# Patient Record
Sex: Female | Born: 1945
Health system: Southern US, Community
[De-identification: ages and names within clinical notes are randomized; demographics above are authoritative.]

## PROBLEM LIST (undated history)

## (undated) DIAGNOSIS — Z9889 Other specified postprocedural states: Secondary | ICD-10-CM

## (undated) DIAGNOSIS — A809 Acute poliomyelitis, unspecified: Secondary | ICD-10-CM

## (undated) DIAGNOSIS — I341 Nonrheumatic mitral (valve) prolapse: Secondary | ICD-10-CM

## (undated) DIAGNOSIS — R000 Tachycardia, unspecified: Secondary | ICD-10-CM

## (undated) DIAGNOSIS — C801 Malignant (primary) neoplasm, unspecified: Secondary | ICD-10-CM

## (undated) DIAGNOSIS — T8859XA Other complications of anesthesia, initial encounter: Secondary | ICD-10-CM

## (undated) DIAGNOSIS — I10 Essential (primary) hypertension: Secondary | ICD-10-CM

## (undated) DIAGNOSIS — T884XXA Failed or difficult intubation, initial encounter: Secondary | ICD-10-CM

## (undated) DIAGNOSIS — M1990 Unspecified osteoarthritis, unspecified site: Secondary | ICD-10-CM

## (undated) DIAGNOSIS — T4145XA Adverse effect of unspecified anesthetic, initial encounter: Secondary | ICD-10-CM

## (undated) DIAGNOSIS — M797 Fibromyalgia: Secondary | ICD-10-CM

## (undated) DIAGNOSIS — I2510 Atherosclerotic heart disease of native coronary artery without angina pectoris: Secondary | ICD-10-CM

## (undated) DIAGNOSIS — F419 Anxiety disorder, unspecified: Secondary | ICD-10-CM

## (undated) DIAGNOSIS — R112 Nausea with vomiting, unspecified: Secondary | ICD-10-CM

## (undated) HISTORY — PX: NEUROMA SURGERY: SHX722

## (undated) HISTORY — PX: CERVICAL SPINE SURGERY: SHX589

## (undated) HISTORY — PX: FIRST RIB REMOVAL: SHX642

## (undated) HISTORY — PX: TMJ ARTHROPLASTY: SHX1066

## (undated) HISTORY — PX: APPENDECTOMY: SHX54

## (undated) HISTORY — PX: ABDOMINAL HYSTERECTOMY: SHX81

## (undated) HISTORY — PX: HAND SURGERY: SHX662

---

## 2000-05-15 ENCOUNTER — Other Ambulatory Visit: Admission: RE | Admit: 2000-05-15 | Discharge: 2000-05-15 | Payer: Self-pay | Admitting: Gynecology

## 2003-07-01 ENCOUNTER — Other Ambulatory Visit: Admission: RE | Admit: 2003-07-01 | Discharge: 2003-07-01 | Payer: Self-pay | Admitting: Gynecology

## 2010-09-08 ENCOUNTER — Other Ambulatory Visit: Payer: Self-pay | Admitting: Gynecology

## 2010-09-08 DIAGNOSIS — R928 Other abnormal and inconclusive findings on diagnostic imaging of breast: Secondary | ICD-10-CM

## 2010-09-14 ENCOUNTER — Ambulatory Visit
Admission: RE | Admit: 2010-09-14 | Discharge: 2010-09-14 | Disposition: A | Discharge status: Home / Self Care | Payer: PRIVATE HEALTH INSURANCE | Source: Ambulatory Visit | Attending: Gynecology | Admitting: Gynecology

## 2010-09-14 ENCOUNTER — Other Ambulatory Visit: Payer: Self-pay | Admitting: Gynecology

## 2010-09-14 ENCOUNTER — Ambulatory Visit: Payer: Self-pay

## 2010-09-14 DIAGNOSIS — R928 Other abnormal and inconclusive findings on diagnostic imaging of breast: Secondary | ICD-10-CM

## 2013-12-12 ENCOUNTER — Other Ambulatory Visit: Payer: Self-pay | Admitting: Gynecology

## 2013-12-12 DIAGNOSIS — R928 Other abnormal and inconclusive findings on diagnostic imaging of breast: Secondary | ICD-10-CM

## 2013-12-24 ENCOUNTER — Ambulatory Visit
Admission: RE | Admit: 2013-12-24 | Discharge: 2013-12-24 | Disposition: A | Discharge status: Home / Self Care | Payer: Medicare Other | Source: Ambulatory Visit | Attending: Gynecology | Admitting: Gynecology

## 2013-12-24 ENCOUNTER — Other Ambulatory Visit: Payer: PRIVATE HEALTH INSURANCE

## 2013-12-24 ENCOUNTER — Encounter (INDEPENDENT_AMBULATORY_CARE_PROVIDER_SITE_OTHER): Payer: Self-pay

## 2013-12-24 DIAGNOSIS — R928 Other abnormal and inconclusive findings on diagnostic imaging of breast: Secondary | ICD-10-CM

## 2015-10-09 DIAGNOSIS — J029 Acute pharyngitis, unspecified: Secondary | ICD-10-CM | POA: Diagnosis not present

## 2015-11-11 DIAGNOSIS — L659 Nonscarring hair loss, unspecified: Secondary | ICD-10-CM | POA: Diagnosis not present

## 2015-11-11 DIAGNOSIS — N951 Menopausal and female climacteric states: Secondary | ICD-10-CM | POA: Diagnosis not present

## 2016-02-08 DIAGNOSIS — Z9181 History of falling: Secondary | ICD-10-CM | POA: Diagnosis not present

## 2016-02-08 DIAGNOSIS — M549 Dorsalgia, unspecified: Secondary | ICD-10-CM | POA: Diagnosis not present

## 2016-02-08 DIAGNOSIS — G4700 Insomnia, unspecified: Secondary | ICD-10-CM | POA: Diagnosis not present

## 2016-02-08 DIAGNOSIS — L659 Nonscarring hair loss, unspecified: Secondary | ICD-10-CM | POA: Diagnosis not present

## 2016-04-26 DIAGNOSIS — M4722 Other spondylosis with radiculopathy, cervical region: Secondary | ICD-10-CM | POA: Diagnosis not present

## 2016-04-26 DIAGNOSIS — I10 Essential (primary) hypertension: Secondary | ICD-10-CM | POA: Diagnosis not present

## 2016-04-26 DIAGNOSIS — Z6827 Body mass index (BMI) 27.0-27.9, adult: Secondary | ICD-10-CM | POA: Diagnosis not present

## 2016-04-26 DIAGNOSIS — M542 Cervicalgia: Secondary | ICD-10-CM | POA: Diagnosis not present

## 2016-05-04 DIAGNOSIS — M47812 Spondylosis without myelopathy or radiculopathy, cervical region: Secondary | ICD-10-CM | POA: Diagnosis not present

## 2016-05-04 DIAGNOSIS — M4722 Other spondylosis with radiculopathy, cervical region: Secondary | ICD-10-CM | POA: Diagnosis not present

## 2016-07-29 DIAGNOSIS — M1288 Other specific arthropathies, not elsewhere classified, other specified site: Secondary | ICD-10-CM | POA: Diagnosis not present

## 2016-07-29 DIAGNOSIS — M542 Cervicalgia: Secondary | ICD-10-CM | POA: Diagnosis not present

## 2016-07-29 DIAGNOSIS — I10 Essential (primary) hypertension: Secondary | ICD-10-CM | POA: Diagnosis not present

## 2016-07-29 DIAGNOSIS — Z6827 Body mass index (BMI) 27.0-27.9, adult: Secondary | ICD-10-CM | POA: Diagnosis not present

## 2016-08-24 DIAGNOSIS — M542 Cervicalgia: Secondary | ICD-10-CM | POA: Diagnosis not present

## 2016-08-24 DIAGNOSIS — Z0000 Encounter for general adult medical examination without abnormal findings: Secondary | ICD-10-CM | POA: Diagnosis not present

## 2016-09-13 DIAGNOSIS — M1991 Primary osteoarthritis, unspecified site: Secondary | ICD-10-CM | POA: Diagnosis not present

## 2016-09-13 DIAGNOSIS — M542 Cervicalgia: Secondary | ICD-10-CM | POA: Diagnosis not present

## 2016-09-13 DIAGNOSIS — M797 Fibromyalgia: Secondary | ICD-10-CM | POA: Diagnosis not present

## 2016-09-16 DIAGNOSIS — M797 Fibromyalgia: Secondary | ICD-10-CM | POA: Diagnosis not present

## 2016-09-16 DIAGNOSIS — M1991 Primary osteoarthritis, unspecified site: Secondary | ICD-10-CM | POA: Diagnosis not present

## 2016-09-16 DIAGNOSIS — M542 Cervicalgia: Secondary | ICD-10-CM | POA: Diagnosis not present

## 2016-09-19 DIAGNOSIS — M542 Cervicalgia: Secondary | ICD-10-CM | POA: Diagnosis not present

## 2016-09-19 DIAGNOSIS — M797 Fibromyalgia: Secondary | ICD-10-CM | POA: Diagnosis not present

## 2016-09-19 DIAGNOSIS — M1991 Primary osteoarthritis, unspecified site: Secondary | ICD-10-CM | POA: Diagnosis not present

## 2016-09-22 DIAGNOSIS — M797 Fibromyalgia: Secondary | ICD-10-CM | POA: Diagnosis not present

## 2016-09-22 DIAGNOSIS — M1991 Primary osteoarthritis, unspecified site: Secondary | ICD-10-CM | POA: Diagnosis not present

## 2016-09-22 DIAGNOSIS — M542 Cervicalgia: Secondary | ICD-10-CM | POA: Diagnosis not present

## 2016-09-26 DIAGNOSIS — M542 Cervicalgia: Secondary | ICD-10-CM | POA: Diagnosis not present

## 2016-09-26 DIAGNOSIS — M797 Fibromyalgia: Secondary | ICD-10-CM | POA: Diagnosis not present

## 2016-09-26 DIAGNOSIS — M1991 Primary osteoarthritis, unspecified site: Secondary | ICD-10-CM | POA: Diagnosis not present

## 2016-09-29 DIAGNOSIS — M797 Fibromyalgia: Secondary | ICD-10-CM | POA: Diagnosis not present

## 2016-09-29 DIAGNOSIS — M542 Cervicalgia: Secondary | ICD-10-CM | POA: Diagnosis not present

## 2016-09-29 DIAGNOSIS — M1991 Primary osteoarthritis, unspecified site: Secondary | ICD-10-CM | POA: Diagnosis not present

## 2016-10-31 DIAGNOSIS — L659 Nonscarring hair loss, unspecified: Secondary | ICD-10-CM | POA: Diagnosis not present

## 2016-10-31 DIAGNOSIS — Z6830 Body mass index (BMI) 30.0-30.9, adult: Secondary | ICD-10-CM | POA: Diagnosis not present

## 2016-10-31 DIAGNOSIS — N951 Menopausal and female climacteric states: Secondary | ICD-10-CM | POA: Diagnosis not present

## 2016-10-31 DIAGNOSIS — M542 Cervicalgia: Secondary | ICD-10-CM | POA: Diagnosis not present

## 2016-11-03 DIAGNOSIS — N951 Menopausal and female climacteric states: Secondary | ICD-10-CM | POA: Diagnosis not present

## 2016-11-22 DIAGNOSIS — M1288 Other specific arthropathies, not elsewhere classified, other specified site: Secondary | ICD-10-CM | POA: Diagnosis not present

## 2017-01-11 ENCOUNTER — Other Ambulatory Visit: Payer: Self-pay | Admitting: Neurosurgery

## 2017-02-09 ENCOUNTER — Encounter (HOSPITAL_COMMUNITY)
Admission: RE | Admit: 2017-02-09 | Discharge: 2017-02-09 | Disposition: A | Discharge status: Home / Self Care | Payer: PPO | Source: Ambulatory Visit | Attending: Neurosurgery | Admitting: Neurosurgery

## 2017-02-09 ENCOUNTER — Encounter (HOSPITAL_COMMUNITY): Payer: Self-pay

## 2017-02-09 DIAGNOSIS — Z01810 Encounter for preprocedural cardiovascular examination: Secondary | ICD-10-CM | POA: Insufficient documentation

## 2017-02-09 DIAGNOSIS — M1288 Other specific arthropathies, not elsewhere classified, other specified site: Secondary | ICD-10-CM | POA: Insufficient documentation

## 2017-02-09 DIAGNOSIS — Z01812 Encounter for preprocedural laboratory examination: Secondary | ICD-10-CM | POA: Insufficient documentation

## 2017-02-09 HISTORY — DX: Essential (primary) hypertension: I10

## 2017-02-09 HISTORY — DX: Unspecified osteoarthritis, unspecified site: M19.90

## 2017-02-09 HISTORY — DX: Nonrheumatic mitral (valve) prolapse: I34.1

## 2017-02-09 HISTORY — DX: Adverse effect of unspecified anesthetic, initial encounter: T41.45XA

## 2017-02-09 HISTORY — DX: Other complications of anesthesia, initial encounter: T88.59XA

## 2017-02-09 HISTORY — DX: Nausea with vomiting, unspecified: R11.2

## 2017-02-09 HISTORY — DX: Nausea with vomiting, unspecified: Z98.890

## 2017-02-09 LAB — CBC
HEMATOCRIT: 42.5 % (ref 36.0–46.0)
Hemoglobin: 14 g/dL (ref 12.0–15.0)
MCH: 29.5 pg (ref 26.0–34.0)
MCHC: 32.9 g/dL (ref 30.0–36.0)
MCV: 89.5 fL (ref 78.0–100.0)
Platelets: 228 10*3/uL (ref 150–400)
RBC: 4.75 MIL/uL (ref 3.87–5.11)
RDW: 13.2 % (ref 11.5–15.5)
WBC: 8 10*3/uL (ref 4.0–10.5)

## 2017-02-09 LAB — BASIC METABOLIC PANEL
ANION GAP: 8 (ref 5–15)
BUN: 6 mg/dL (ref 6–20)
CHLORIDE: 106 mmol/L (ref 101–111)
CO2: 24 mmol/L (ref 22–32)
Calcium: 9.4 mg/dL (ref 8.9–10.3)
Creatinine, Ser: 0.7 mg/dL (ref 0.44–1.00)
GFR calc Af Amer: >60 mL/min (ref >60)
GFR calc non Af Amer: >60 mL/min (ref >60)
Glucose, Bld: 92 mg/dL (ref 65–99)
POTASSIUM: 3.6 mmol/L (ref 3.5–5.1)
SODIUM: 138 mmol/L (ref 135–145)

## 2017-02-09 LAB — TYPE AND SCREEN
ABO/RH(D): A POS
Antibody Screen: NEGATIVE

## 2017-02-09 LAB — ABO/RH: ABO/RH(D): A POS

## 2017-02-09 LAB — SURGICAL PCR SCREEN
MRSA, PCR: NEGATIVE
Staphylococcus aureus: NEGATIVE

## 2017-02-09 NOTE — Progress Notes (Addendum)
Anesthesia Chart Review: Patient is a 71 year old female scheduled for C1-2 posterior instrumentation and fusion on 02/16/17 by Dr. Newman Pies.  History includes never smoker, post-operative N/V, HTN, MVP, arthritis, hysterectomy, C5-7 ACDF 05/26/11, foot neuroma surgery, appendectomy, bilateral hand surgery, RUE surgery (for nerve decompression following rib resection), first rib removal, TMJ arthroplasty ~ 25 years ago (reportedly had "Teflon discs" removed after ~ 5 years due to reaction of headaches/nausea). PCP notes mention history of Polio at age 44 with ~ 2 weeks of LE paralysis.   I think she is a potential difficult intubation due to limited mouth opening (~ 2 1/2 FB between prominent teeth) and limited neck mobility. She denied history of awake intubation. She has a history of ACDF. 05/26/11 anesthesia record from [facility] received. Writing is small, but it appears a McGRATH video laryngoscope was used to place a 7.0 (or 7.5) ETT. Copies are on paper chart for review by her anesthesia team as needed.    She reported a normal cardiac cath ~ 35 years ago at the time of her right first rib resection. If she had a stress test it was > 20 years ago. She does not recall ever having an echo, althought she believes she's been told she has MVP. She denied chest pain, SOB, syncope, edema. She gets occasional palpitations that last a couple of seconds. Has what sounds like occasional vertigo when lying down which she attributes to neck issues.   Meds include amlodipine, aspirin 81 mg, baclofen, Propecia, Zanaflex, tramadol, valsartan, Biest.6/Prog75/Test.5/DHEA2 compound.   PCP is Dr. Ann Held at [facility] in [place].   BP (!) 169/78   Pulse 66   Temp 36.7 C   Resp 20   Ht 5' 6.5" (1.689 m)   Wt 175 lb 6.4 oz (79.6 kg)   SpO2 98%   BMI 27.89 kg/m  Exam shows a pleasant Caucasian female in NAD. Heart RRR, no murmur noted. Lungs clear. No  carotid bruits noted. No ankle edema. Limited neck mobility and mouth opening. Prominent front upper central incisors.    EKG 02/09/17: NSR. RSR prime or QR pattern in V1-2.   Preoperative CBC and BMET WNL.  If no acute changes then I anticipate that she can proceed as planned. Discussed that she would require GETA for this procedure and may require advanced airway equipment due to limited mouth opening and neck mobility. Anesthesiologist will evaluate further on the day of surgery.   George Hugh [facility] Short Stay Center/Anesthesiology Phone [phone] 02/09/2017 4:12 PM

## 2017-02-09 NOTE — Pre-Procedure Instructions (Addendum)
Clova Morlock  02/09/2017      [facility] [address] [place], [place] [address] Phone: [phone] Fax: [phone]    Your procedure is scheduled on Thurs. July 26  Report to [facility] Admitting at 7:45 A.M.  Call this number if you have problems the morning of surgery:  [phone]   Remember:  Do not eat food or drink liquids after midnight on Wed. July 25   Take these medicines the morning of surgery with A SIP OF WATER : tramadol if needed, finasteride (propecia)              1 week prior to surgery stop :advil, motrin, ibuprofen, aleve, BC Powders, Goody's, vitamins and herbal medicines.               Stop aspirin per Dr.Jenkins   Do not wear jewelry, make-up or nail polish.  Do not wear lotions, powders, or perfumes, or deoderant.  Do not shave 48 hours prior to surgery.  Men may shave face and neck.  Do not bring valuables to the hospital.  [facility] is not responsible for any belongings or valuables.  Contacts, dentures or bridgework may not be worn into surgery.  Leave your suitcase in the car.  After surgery it may be brought to your room.  For patients admitted to the hospital, discharge time will be determined by your treatment team.  Patients discharged the day of surgery will not be allowed to drive home.    Special instructions:  [facility]- Preparing For Surgery  Before surgery, you can play an important role. Because skin is not sterile, your skin needs to be as free of germs as possible. You can reduce the number of germs on your skin by washing with CHG (chlorahexidine gluconate) Soap before surgery.  CHG is an antiseptic cleaner which kills germs and bonds with the skin to continue killing germs even after washing.  Please do not use if you have an allergy to CHG or antibacterial soaps. If your skin becomes reddened/irritated stop using the CHG.  Do not shave (including legs and  underarms) for at least 48 hours prior to first CHG shower. It is OK to shave your face.  Please follow these instructions carefully.   1. Shower the NIGHT BEFORE SURGERY and the MORNING OF SURGERY with CHG.   2. If you chose to wash your hair, wash your hair first as usual with your normal shampoo.  3. After you shampoo, rinse your hair and body thoroughly to remove the shampoo.  4. Use CHG as you would any other liquid soap. You can apply CHG directly to the skin and wash gently with a scrungie or a clean washcloth.   5. Apply the CHG Soap to your body ONLY FROM THE NECK DOWN.  Do not use on open wounds or open sores. Avoid contact with your eyes, ears, mouth and genitals (private parts). Wash genitals (private parts) with your normal soap.  6. Wash thoroughly, paying special attention to the area where your surgery will be performed.  7. Thoroughly rinse your body with warm water from the neck down.  8. DO NOT shower/wash with your normal soap after using and rinsing off the CHG Soap.  9. Pat yourself dry with a CLEAN TOWEL.   10. Wear CLEAN PAJAMAS   11. Place CLEAN SHEETS on your bed the night of your first shower and DO  NOT SLEEP WITH PETS.    Day of Surgery: Do not apply any deodorants/lotions. Please wear clean clothes to the hospital/surgery center.      Please read over the following fact sheets that you were given. MRSA Information and Surgical Site Infection Prevention

## 2017-02-09 NOTE — Progress Notes (Signed)
PCP: Dr. Ann Held @ [facility] in [place] ekg/notes  no cardiologist

## 2017-02-15 MED ORDER — CEFAZOLIN SODIUM-DEXTROSE 2-4 GM/100ML-% IV SOLN
2.0000 g | INTRAVENOUS | Status: AC
  Administered 2017-02-16: 2 g via INTRAVENOUS
  Filled 2017-02-15: qty 100

## 2017-02-15 NOTE — Anesthesia Preprocedure Evaluation (Addendum)
Anesthesia Evaluation  Patient identified by MRN, date of birth, ID band Patient awake    Reviewed: Allergy & Precautions, NPO status , Patient's Chart, lab work & pertinent test results  History of Anesthesia Complications (+) PONV and history of anesthetic complications  Airway Mallampati: III  TM Distance: <3 FB Neck ROM: Limited  Mouth opening: Limited Mouth Opening  Dental no notable dental hx. (+) Teeth Intact   Pulmonary neg pulmonary ROS,    Pulmonary exam normal breath sounds clear to auscultation       Cardiovascular hypertension, Pt. on medications negative cardio ROS Normal cardiovascular exam Rhythm:Regular Rate:Normal     Neuro/Psych negative neurological ROS  negative psych ROS   GI/Hepatic negative GI ROS, Neg liver ROS,   Endo/Other  negative endocrine ROS  Renal/GU negative Renal ROS  negative genitourinary   Musculoskeletal negative musculoskeletal ROS (+)   Abdominal   Peds negative pediatric ROS (+)  Hematology negative hematology ROS (+)   Anesthesia Other Findings Limited mouth opening and neck mobility, s/p TMJ surgeries   Potential difficult intubation due to limited mouth opening (~ 2 1/2 FB between prominent teeth) and limited neck mobility. She denied history of awake intubation. She has a history of ACDF. 05/26/11 anesthesia record from [facility] received. Writing is small, but it appears a McGRATH video laryngoscope was used to place a 7.0 (or 7.5) ETT. Copies are on paper chart for review by her anesthesia team as needed.    Reproductive/Obstetrics negative OB ROS                          Anesthesia Physical Anesthesia Plan  ASA: III  Anesthesia Plan: General   Post-op Pain Management:    Induction: Intravenous  PONV Risk Score and Plan: 3 and Ondansetron, Dexamethasone, Propofol, Midazolam and Treatment may vary due to age  or medical condition  Airway Management Planned: Oral ETT  Additional Equipment:   Intra-op Plan:   Post-operative Plan: Extubation in OR  Informed Consent: I have reviewed the patients History and Physical, chart, labs and discussed the procedure including the risks, benefits and alternatives for the proposed anesthesia with the patient or authorized representative who has indicated his/her understanding and acceptance.   Dental advisory given and Dental Advisory Given  Plan Discussed with: CRNA, Surgeon and Anesthesiologist  Anesthesia Plan Comments: (  )       Anesthesia Quick Evaluation

## 2017-02-16 ENCOUNTER — Encounter (HOSPITAL_COMMUNITY)
Admission: RE | Disposition: A | Discharge status: Home / Self Care | Payer: Self-pay | Source: Ambulatory Visit | Attending: Neurosurgery

## 2017-02-16 ENCOUNTER — Encounter (HOSPITAL_COMMUNITY): Payer: Self-pay

## 2017-02-16 ENCOUNTER — Inpatient Hospital Stay (HOSPITAL_COMMUNITY): Payer: PPO

## 2017-02-16 ENCOUNTER — Inpatient Hospital Stay (HOSPITAL_COMMUNITY): Payer: PPO | Admitting: Vascular Surgery

## 2017-02-16 ENCOUNTER — Inpatient Hospital Stay (HOSPITAL_COMMUNITY)
Admission: RE | Admit: 2017-02-16 | Discharge: 2017-02-17 | DRG: 473 | Disposition: A | Discharge status: Home / Self Care | Payer: PPO | Source: Ambulatory Visit | Attending: Neurosurgery | Admitting: Neurosurgery

## 2017-02-16 ENCOUNTER — Inpatient Hospital Stay (HOSPITAL_COMMUNITY): Payer: PPO | Admitting: Anesthesiology

## 2017-02-16 DIAGNOSIS — M4692 Unspecified inflammatory spondylopathy, cervical region: Secondary | ICD-10-CM | POA: Diagnosis not present

## 2017-02-16 DIAGNOSIS — M47812 Spondylosis without myelopathy or radiculopathy, cervical region: Secondary | ICD-10-CM | POA: Diagnosis present

## 2017-02-16 DIAGNOSIS — I341 Nonrheumatic mitral (valve) prolapse: Secondary | ICD-10-CM | POA: Diagnosis not present

## 2017-02-16 DIAGNOSIS — I10 Essential (primary) hypertension: Secondary | ICD-10-CM | POA: Diagnosis present

## 2017-02-16 DIAGNOSIS — Z79899 Other long term (current) drug therapy: Secondary | ICD-10-CM | POA: Diagnosis not present

## 2017-02-16 DIAGNOSIS — M4302 Spondylolysis, cervical region: Secondary | ICD-10-CM

## 2017-02-16 DIAGNOSIS — M542 Cervicalgia: Secondary | ICD-10-CM | POA: Diagnosis present

## 2017-02-16 DIAGNOSIS — M4322 Fusion of spine, cervical region: Secondary | ICD-10-CM | POA: Diagnosis not present

## 2017-02-16 DIAGNOSIS — Z90710 Acquired absence of both cervix and uterus: Secondary | ICD-10-CM

## 2017-02-16 DIAGNOSIS — Z7982 Long term (current) use of aspirin: Secondary | ICD-10-CM | POA: Diagnosis not present

## 2017-02-16 DIAGNOSIS — M1288 Other specific arthropathies, not elsewhere classified, other specified site: Secondary | ICD-10-CM | POA: Diagnosis not present

## 2017-02-16 DIAGNOSIS — M5382 Other specified dorsopathies, cervical region: Secondary | ICD-10-CM | POA: Diagnosis not present

## 2017-02-16 HISTORY — PX: POSTERIOR CERVICAL FUSION/FORAMINOTOMY: SHX5038

## 2017-02-16 SURGERY — POSTERIOR CERVICAL FUSION/FORAMINOTOMY LEVEL 1
Anesthesia: General

## 2017-02-16 MED ORDER — PHENYLEPHRINE HCL 10 MG/ML IJ SOLN
INTRAVENOUS | Status: DC | PRN
  Administered 2017-02-16: 20 ug/min via INTRAVENOUS

## 2017-02-16 MED ORDER — PROPOFOL 10 MG/ML IV BOLUS
INTRAVENOUS | Status: DC | PRN
  Administered 2017-02-16: 200 mg via INTRAVENOUS

## 2017-02-16 MED ORDER — CHLORHEXIDINE GLUCONATE CLOTH 2 % EX PADS: 6.0000 | MEDICATED_PAD | Freq: Once | CUTANEOUS | Status: DC

## 2017-02-16 MED ORDER — MENTHOL 3 MG MT LOZG
1.0000 | LOZENGE | OROMUCOSAL | Status: DC | PRN
  Administered 2017-02-16: 3 mg via ORAL
  Filled 2017-02-16: qty 9

## 2017-02-16 MED ORDER — IRBESARTAN 150 MG PO TABS
150.0000 mg | ORAL_TABLET | Freq: Every day | ORAL | Status: DC
Start: 2017-02-16 — End: 2017-02-17
  Administered 2017-02-16: 150 mg via ORAL
  Filled 2017-02-16 (×2): qty 1

## 2017-02-16 MED ORDER — BISACODYL 10 MG RE SUPP: 10.0000 mg | Freq: Every day | RECTAL | Status: DC | PRN

## 2017-02-16 MED ORDER — VITAMIN D 1000 UNITS PO TABS
2000.0000 [IU] | ORAL_TABLET | Freq: Every day | ORAL | Status: DC
  Administered 2017-02-16: 2000 [IU] via ORAL
  Filled 2017-02-16: qty 2

## 2017-02-16 MED ORDER — THROMBIN 5000 UNITS EX SOLR
CUTANEOUS | Status: AC
  Filled 2017-02-16: qty 5000

## 2017-02-16 MED ORDER — DEXAMETHASONE SODIUM PHOSPHATE 10 MG/ML IJ SOLN
INTRAMUSCULAR | Status: DC | PRN
  Administered 2017-02-16: 10 mg via INTRAVENOUS

## 2017-02-16 MED ORDER — THROMBIN 5000 UNITS EX SOLR
CUTANEOUS | Status: DC | PRN
  Administered 2017-02-16 (×2): 5000 [IU] via TOPICAL

## 2017-02-16 MED ORDER — DOCUSATE SODIUM 100 MG PO CAPS
100.0000 mg | ORAL_CAPSULE | Freq: Two times a day (BID) | ORAL | Status: DC
  Administered 2017-02-16: 100 mg via ORAL
  Filled 2017-02-16: qty 1

## 2017-02-16 MED ORDER — ZOLPIDEM TARTRATE 5 MG PO TABS: 5.0000 mg | ORAL_TABLET | Freq: Every evening | ORAL | Status: DC | PRN

## 2017-02-16 MED ORDER — HEMOSTATIC AGENTS (NO CHARGE) OPTIME
TOPICAL | Status: DC | PRN
  Administered 2017-02-16: 1 via TOPICAL

## 2017-02-16 MED ORDER — TRAMADOL HCL 50 MG PO TABS: 50.0000 mg | ORAL_TABLET | Freq: Three times a day (TID) | ORAL | Status: DC | PRN

## 2017-02-16 MED ORDER — OXYCODONE HCL 5 MG PO TABS
5.0000 mg | ORAL_TABLET | ORAL | Status: DC | PRN
  Administered 2017-02-16 – 2017-02-17 (×5): 10 mg via ORAL
  Filled 2017-02-16 (×4): qty 2

## 2017-02-16 MED ORDER — ACETAMINOPHEN 325 MG PO TABS
650.0000 mg | ORAL_TABLET | ORAL | Status: DC | PRN
  Administered 2017-02-16 – 2017-02-17 (×2): 650 mg via ORAL
  Filled 2017-02-16: qty 2

## 2017-02-16 MED ORDER — 0.9 % SODIUM CHLORIDE (POUR BTL) OPTIME
TOPICAL | Status: DC | PRN
  Administered 2017-02-16: 1000 mL

## 2017-02-16 MED ORDER — BACLOFEN 10 MG PO TABS
10.0000 mg | ORAL_TABLET | Freq: Every day | ORAL | Status: DC
  Administered 2017-02-16: 10 mg via ORAL
  Filled 2017-02-16: qty 1

## 2017-02-16 MED ORDER — SODIUM CHLORIDE 0.9 % IR SOLN
Status: DC | PRN
  Administered 2017-02-16: 09:00:00

## 2017-02-16 MED ORDER — BUPIVACAINE-EPINEPHRINE (PF) 0.5% -1:200000 IJ SOLN
INTRAMUSCULAR | Status: AC
  Filled 2017-02-16: qty 30

## 2017-02-16 MED ORDER — SUGAMMADEX SODIUM 200 MG/2ML IV SOLN
INTRAVENOUS | Status: DC | PRN
  Administered 2017-02-16: 200 mg via INTRAVENOUS

## 2017-02-16 MED ORDER — BUPIVACAINE LIPOSOME 1.3 % IJ SUSP
20.0000 mL | Freq: Once | INTRAMUSCULAR | Status: DC
  Filled 2017-02-16: qty 20

## 2017-02-16 MED ORDER — SUCCINYLCHOLINE CHLORIDE 20 MG/ML IJ SOLN
INTRAMUSCULAR | Status: DC | PRN
  Administered 2017-02-16: 140 mg via INTRAVENOUS

## 2017-02-16 MED ORDER — MIDAZOLAM HCL 2 MG/2ML IJ SOLN
INTRAMUSCULAR | Status: AC
  Filled 2017-02-16: qty 2

## 2017-02-16 MED ORDER — LIDOCAINE 2% (20 MG/ML) 5 ML SYRINGE
INTRAMUSCULAR | Status: AC
  Filled 2017-02-16: qty 10

## 2017-02-16 MED ORDER — FENTANYL CITRATE (PF) 250 MCG/5ML IJ SOLN
INTRAMUSCULAR | Status: AC
  Filled 2017-02-16: qty 5

## 2017-02-16 MED ORDER — MEPERIDINE HCL 25 MG/ML IJ SOLN: 6.2500 mg | INTRAMUSCULAR | Status: DC | PRN

## 2017-02-16 MED ORDER — DEXAMETHASONE SODIUM PHOSPHATE 10 MG/ML IJ SOLN
INTRAMUSCULAR | Status: AC
  Filled 2017-02-16: qty 2

## 2017-02-16 MED ORDER — BUPIVACAINE-EPINEPHRINE 0.5% -1:200000 IJ SOLN
INTRAMUSCULAR | Status: DC | PRN
  Administered 2017-02-16: 10 mL

## 2017-02-16 MED ORDER — MORPHINE SULFATE (PF) 4 MG/ML IV SOLN
INTRAVENOUS | Status: AC
  Administered 2017-02-16: 4 mg via INTRAVENOUS
  Filled 2017-02-16: qty 1

## 2017-02-16 MED ORDER — ARTIFICIAL TEARS OPHTHALMIC OINT
TOPICAL_OINTMENT | OPHTHALMIC | Status: DC | PRN
  Administered 2017-02-16: 1 via OPHTHALMIC

## 2017-02-16 MED ORDER — THROMBIN 5000 UNITS EX SOLR
OROMUCOSAL | Status: DC | PRN
  Administered 2017-02-16: 09:00:00 via TOPICAL

## 2017-02-16 MED ORDER — SUGAMMADEX SODIUM 200 MG/2ML IV SOLN
INTRAVENOUS | Status: AC
  Filled 2017-02-16: qty 2

## 2017-02-16 MED ORDER — FENTANYL CITRATE (PF) 100 MCG/2ML IJ SOLN
INTRAMUSCULAR | Status: DC | PRN
  Administered 2017-02-16 (×3): 50 ug via INTRAVENOUS
  Administered 2017-02-16: 100 ug via INTRAVENOUS
  Administered 2017-02-16 (×3): 50 ug via INTRAVENOUS

## 2017-02-16 MED ORDER — ONDANSETRON HCL 4 MG/2ML IJ SOLN
INTRAMUSCULAR | Status: DC | PRN
  Administered 2017-02-16: 4 mg via INTRAVENOUS

## 2017-02-16 MED ORDER — ROCURONIUM BROMIDE 100 MG/10ML IV SOLN
INTRAVENOUS | Status: DC | PRN
  Administered 2017-02-16: 50 mg via INTRAVENOUS
  Administered 2017-02-16 (×2): 10 mg via INTRAVENOUS

## 2017-02-16 MED ORDER — CEFAZOLIN SODIUM-DEXTROSE 2-4 GM/100ML-% IV SOLN
2.0000 g | Freq: Three times a day (TID) | INTRAVENOUS | Status: AC
  Administered 2017-02-16 (×2): 2 g via INTRAVENOUS
  Filled 2017-02-16 (×2): qty 100

## 2017-02-16 MED ORDER — ONDANSETRON HCL 4 MG/2ML IJ SOLN: 4.0000 mg | Freq: Once | INTRAMUSCULAR | Status: DC | PRN

## 2017-02-16 MED ORDER — FENTANYL CITRATE (PF) 100 MCG/2ML IJ SOLN
INTRAMUSCULAR | Status: AC
  Administered 2017-02-16: 50 ug via INTRAVENOUS
  Filled 2017-02-16: qty 2

## 2017-02-16 MED ORDER — OXYCODONE HCL 5 MG PO TABS
ORAL_TABLET | ORAL | Status: AC
  Filled 2017-02-16: qty 2

## 2017-02-16 MED ORDER — PROPOFOL 10 MG/ML IV BOLUS
INTRAVENOUS | Status: AC
  Filled 2017-02-16: qty 20

## 2017-02-16 MED ORDER — FENTANYL CITRATE (PF) 100 MCG/2ML IJ SOLN
25.0000 ug | INTRAMUSCULAR | Status: DC | PRN
  Administered 2017-02-16 (×3): 50 ug via INTRAVENOUS

## 2017-02-16 MED ORDER — ONDANSETRON HCL 4 MG PO TABS
4.0000 mg | ORAL_TABLET | Freq: Four times a day (QID) | ORAL | Status: DC | PRN
  Administered 2017-02-16: 4 mg via ORAL
  Filled 2017-02-16: qty 1

## 2017-02-16 MED ORDER — TIZANIDINE HCL 2 MG PO TABS
2.0000 mg | ORAL_TABLET | Freq: Every day | ORAL | Status: DC
  Administered 2017-02-16 (×2): 2 mg via ORAL
  Filled 2017-02-16 (×2): qty 1

## 2017-02-16 MED ORDER — BUPIVACAINE LIPOSOME 1.3 % IJ SUSP
INTRAMUSCULAR | Status: DC | PRN
  Administered 2017-02-16: 20 mL

## 2017-02-16 MED ORDER — ONDANSETRON HCL 4 MG/2ML IJ SOLN: 4.0000 mg | Freq: Four times a day (QID) | INTRAMUSCULAR | Status: DC | PRN

## 2017-02-16 MED ORDER — AMLODIPINE BESYLATE 5 MG PO TABS
5.0000 mg | ORAL_TABLET | Freq: Every evening | ORAL | Status: DC
Start: 2017-02-16 — End: 2017-02-17
  Administered 2017-02-16: 5 mg via ORAL
  Filled 2017-02-16 (×2): qty 1

## 2017-02-16 MED ORDER — LACTATED RINGERS IV SOLN
INTRAVENOUS | Status: DC | PRN
  Administered 2017-02-16 (×2): via INTRAVENOUS

## 2017-02-16 MED ORDER — THROMBIN 5000 UNITS EX SOLR
CUTANEOUS | Status: AC
Start: 2017-02-16 — End: 2017-02-16
  Filled 2017-02-16: qty 10000

## 2017-02-16 MED ORDER — FINASTERIDE 1 MG PO TABS: 0.5000 mg | ORAL_TABLET | ORAL | Status: DC

## 2017-02-16 MED ORDER — LIDOCAINE HCL (CARDIAC) 20 MG/ML IV SOLN
INTRAVENOUS | Status: DC | PRN
  Administered 2017-02-16: 50 mg via INTRAVENOUS

## 2017-02-16 MED ORDER — ONDANSETRON HCL 4 MG/2ML IJ SOLN
INTRAMUSCULAR | Status: AC
  Filled 2017-02-16: qty 4

## 2017-02-16 MED ORDER — GLYCOPYRROLATE 0.2 MG/ML IJ SOLN
INTRAMUSCULAR | Status: DC | PRN
  Administered 2017-02-16: .1 mg via INTRAVENOUS

## 2017-02-16 MED ORDER — PHENOL 1.4 % MT LIQD: 1.0000 | OROMUCOSAL | Status: DC | PRN

## 2017-02-16 MED ORDER — BACITRACIN ZINC 500 UNIT/GM EX OINT
TOPICAL_OINTMENT | CUTANEOUS | Status: AC
  Filled 2017-02-16: qty 28.35

## 2017-02-16 MED ORDER — ALUM & MAG HYDROXIDE-SIMETH 200-200-20 MG/5ML PO SUSP: 30.0000 mL | Freq: Four times a day (QID) | ORAL | Status: DC | PRN

## 2017-02-16 MED ORDER — ACETAMINOPHEN 650 MG RE SUPP: 650.0000 mg | RECTAL | Status: DC | PRN

## 2017-02-16 MED ORDER — MORPHINE SULFATE (PF) 4 MG/ML IV SOLN
4.0000 mg | INTRAVENOUS | Status: DC | PRN
  Administered 2017-02-16: 4 mg via INTRAVENOUS

## 2017-02-16 MED ORDER — LACTATED RINGERS IV SOLN
INTRAVENOUS | Status: DC
  Administered 2017-02-16: 13:00:00 via INTRAVENOUS

## 2017-02-16 MED ORDER — MIDAZOLAM HCL 5 MG/5ML IJ SOLN
INTRAMUSCULAR | Status: DC | PRN
  Administered 2017-02-16: 2 mg via INTRAVENOUS

## 2017-02-16 MED ORDER — ROCURONIUM BROMIDE 10 MG/ML (PF) SYRINGE
PREFILLED_SYRINGE | INTRAVENOUS | Status: AC
  Filled 2017-02-16: qty 10

## 2017-02-16 MED ORDER — FENTANYL CITRATE (PF) 100 MCG/2ML IJ SOLN
INTRAMUSCULAR | Status: AC
  Filled 2017-02-16: qty 2

## 2017-02-16 MED ORDER — ACETAMINOPHEN 325 MG PO TABS
ORAL_TABLET | ORAL | Status: AC
  Filled 2017-02-16: qty 2

## 2017-02-16 SURGICAL SUPPLY — 65 items
BAG DECANTER FOR FLEXI CONT (MISCELLANEOUS) ×3 IMPLANT
BENZOIN TINCTURE PRP APPL 2/3 (GAUZE/BANDAGES/DRESSINGS) ×3 IMPLANT
BIT DRILL NEURO 2X3.1 SFT TUCH (MISCELLANEOUS) ×2 IMPLANT
BLADE CLIPPER SURG (BLADE) IMPLANT
BLADE ULTRA TIP 2M (BLADE) IMPLANT
CABLE SNG STERILE W/CRIMP (MISCELLANEOUS) IMPLANT
CANISTER SUCT 3000ML PPV (MISCELLANEOUS) ×3 IMPLANT
CAP CLSR POST CERV (Cap) ×12 IMPLANT
CARTRIDGE OIL MAESTRO DRILL (MISCELLANEOUS) ×1 IMPLANT
CLOSURE WOUND 1/2 X4 (GAUZE/BANDAGES/DRESSINGS) ×1
DECANTER SPIKE VIAL GLASS SM (MISCELLANEOUS) ×3 IMPLANT
DIFFUSER DRILL AIR PNEUMATIC (MISCELLANEOUS) ×3 IMPLANT
DRAPE C-ARM 42X72 X-RAY (DRAPES) ×9 IMPLANT
DRAPE LAPAROTOMY 100X72 PEDS (DRAPES) ×3 IMPLANT
DRAPE MICROSCOPE LEICA (MISCELLANEOUS) IMPLANT
DRAPE POUCH INSTRU U-SHP 10X18 (DRAPES) ×3 IMPLANT
DRILL NEURO 2X3.1 SOFT TOUCH (MISCELLANEOUS) ×6
ELECT REM PT RETURN 9FT ADLT (ELECTROSURGICAL) ×3
ELECTRODE REM PT RTRN 9FT ADLT (ELECTROSURGICAL) ×1 IMPLANT
GAUZE SPONGE 4X4 12PLY STRL (GAUZE/BANDAGES/DRESSINGS) ×3 IMPLANT
GAUZE SPONGE 4X4 16PLY XRAY LF (GAUZE/BANDAGES/DRESSINGS) IMPLANT
GLOVE BIO SURGEON STRL SZ8 (GLOVE) ×6 IMPLANT
GLOVE BIO SURGEON STRL SZ8.5 (GLOVE) ×6 IMPLANT
GLOVE EXAM NITRILE LRG STRL (GLOVE) IMPLANT
GLOVE EXAM NITRILE XL STR (GLOVE) IMPLANT
GLOVE EXAM NITRILE XS STR PU (GLOVE) IMPLANT
GOWN STRL REUS W/ TWL LRG LVL3 (GOWN DISPOSABLE) IMPLANT
GOWN STRL REUS W/ TWL XL LVL3 (GOWN DISPOSABLE) ×2 IMPLANT
GOWN STRL REUS W/TWL 2XL LVL3 (GOWN DISPOSABLE) ×3 IMPLANT
GOWN STRL REUS W/TWL LRG LVL3 (GOWN DISPOSABLE)
GOWN STRL REUS W/TWL XL LVL3 (GOWN DISPOSABLE) ×4
HEMOSTAT SURGICEL 2X14 (HEMOSTASIS) ×3 IMPLANT
KIT BASIN OR (CUSTOM PROCEDURE TRAY) ×3 IMPLANT
KIT INFUSE XX SMALL 0.7CC (Orthopedic Implant) ×3 IMPLANT
KIT ROOM TURNOVER OR (KITS) ×3 IMPLANT
NEEDLE HYPO 21X1.5 SAFETY (NEEDLE) ×3 IMPLANT
NEEDLE HYPO 22GX1.5 SAFETY (NEEDLE) ×3 IMPLANT
NEEDLE SPNL 18GX3.5 QUINCKE PK (NEEDLE) IMPLANT
NS IRRIG 1000ML POUR BTL (IV SOLUTION) ×3 IMPLANT
OIL CARTRIDGE MAESTRO DRILL (MISCELLANEOUS) ×3
PACK LAMINECTOMY NEURO (CUSTOM PROCEDURE TRAY) ×3 IMPLANT
PAD ARMBOARD 7.5X6 YLW CONV (MISCELLANEOUS) ×9 IMPLANT
PATTIES SURGICAL .25X.25 (GAUZE/BANDAGES/DRESSINGS) IMPLANT
PIN MAYFIELD SKULL DISP (PIN) ×3 IMPLANT
PUTTY KINEX BIOACTIVE 5CC (Bone Implant) ×3 IMPLANT
ROD VIRAGE 25MMX3.5MM (Rod) ×3 IMPLANT
ROD VIRAGE 30MMX3.5MM STR (Rod) ×3 IMPLANT
RUBBERBAND STERILE (MISCELLANEOUS) IMPLANT
SCREW VIRAGE 3.5X20 (Screw) ×6 IMPLANT
SCREW VIRAGE SM SHANK 3.5X26MM (Screw) ×6 IMPLANT
SPONGE LAP 4X18 X RAY DECT (DISPOSABLE) IMPLANT
SPONGE NEURO XRAY DETECT 1X3 (DISPOSABLE) IMPLANT
SPONGE SURGIFOAM ABS GEL SZ50 (HEMOSTASIS) ×3 IMPLANT
STAPLER SKIN PROX WIDE 3.9 (STAPLE) ×3 IMPLANT
STRIP CLOSURE SKIN 1/2X4 (GAUZE/BANDAGES/DRESSINGS) ×2 IMPLANT
SUT ETHILON 2 0 FS 18 (SUTURE) IMPLANT
SUT VIC AB 0 CT1 18XCR BRD8 (SUTURE) ×1 IMPLANT
SUT VIC AB 0 CT1 8-18 (SUTURE) ×2
SUT VIC AB 2-0 CP2 18 (SUTURE) ×6 IMPLANT
SYRINGE 20CC LL (MISCELLANEOUS) ×3 IMPLANT
TAPE CLOTH SURG 6X10 WHT LF (GAUZE/BANDAGES/DRESSINGS) ×3 IMPLANT
TOWEL GREEN STERILE (TOWEL DISPOSABLE) ×3 IMPLANT
TOWEL GREEN STERILE FF (TOWEL DISPOSABLE) ×3 IMPLANT
TRAY FOLEY W/METER SILVER 16FR (SET/KITS/TRAYS/PACK) IMPLANT
WATER STERILE IRR 1000ML POUR (IV SOLUTION) ×3 IMPLANT

## 2017-02-16 NOTE — Transfer of Care (Signed)
Immediate Anesthesia Transfer of Care Note  Patient: Christine Hart  Procedure(s) Performed: Procedure(s) with comments: CERVICAL 1- CERVICAL 2 POSTERIOR INSTRUMENTATION AND FUSION (N/A) - CERVICAL 1- CERVICAL 2 POSTERIOR INSTRUMENTATION AND FUSION  Patient Location: PACU  Anesthesia Type:General  Level of Consciousness: awake, alert  and oriented  Airway & Oxygen Therapy: Patient Spontanous Breathing and Patient connected to nasal cannula oxygen  Post-op Assessment: Report given to RN, Post -op Vital signs reviewed and stable and Patient moving all extremities X 4  Post vital signs: Reviewed and stable  Last Vitals:  Vitals:   02/16/17 0552  BP: (!) 194/76  Pulse: 69  Resp: 18  Temp: 36.7 C    Last Pain:  Vitals:   02/16/17 0552  TempSrc: Oral  PainSc: 7       Patients Stated Pain Goal: 4 (46/96/29 5284)  Complications: No apparent anesthesia complications

## 2017-02-16 NOTE — H&P (Signed)
Subjective: The patient is a 71 year old white female who has complained of severe neck pain. She has failed medical management and was worked up with cervical MRI with cervical x-rays. This demonstrated C1-2 facet arthropathy. I discussed the various treatment options with the patient. She has decided to proceed with a C1-2 posterior instrumentation and fusion.   Past Medical History:  Diagnosis Date  . Arthritis   . Complication of anesthesia    Limited mouth opening and neck mobility, s/p TMJ surgeries   . Hypertension   . MVP (mitral valve prolapse)   . PONV (postoperative nausea and vomiting)     Past Surgical History:  Procedure Laterality Date  . ABDOMINAL HYSTERECTOMY    . APPENDECTOMY    . CERVICAL SPINE SURGERY    . FIRST RIB REMOVAL Right   . HAND SURGERY Bilateral   . NEUROMA SURGERY Left   . TMJ ARTHROPLASTY      Allergies  Allergen Reactions  . No Known Allergies     Social History  Substance Use Topics  . Smoking status: Never Smoker  . Smokeless tobacco: Never Used  . Alcohol use No    History reviewed. No pertinent family history. Prior to Admission medications   Medication Sig Start Date End Date Taking? Authorizing Provider  amLODipine (NORVASC) 5 MG tablet Take 5 mg by mouth every evening.   Yes [provider]  aspirin EC 81 MG tablet Take 81 mg by mouth daily.   Yes [provider]  baclofen (LIORESAL) 10 MG tablet Take 10 mg by mouth at bedtime.   Yes [provider]  Cholecalciferol (VITAMIN D3) 2000 units TABS Take 2,000 Units by mouth daily.   Yes [provider]  finasteride (PROPECIA) 1 MG tablet Take 0.5 mg by mouth every other day. Takes 0.5 tablet every other day   Yes [provider]  ibuprofen (ADVIL,MOTRIN) 200 MG tablet Take 400 mg by mouth daily.   Yes [provider]  Probiotic Product (PROBIOTIC PO) Take 2 each by mouth daily.   Yes [provider]  tiZANidine (ZANAFLEX) 4  MG capsule Take 2 mg by mouth at bedtime.   Yes [provider]  traMADol (ULTRAM) 50 MG tablet Take 50 mg by mouth 3 (three) times daily as needed for moderate pain.   Yes [provider]  UNABLE TO FIND DISSOLVE 1/2 TROCHE BETWEEN UPPER GUM LINE AND CHEEK TWICE DAILY. DO NOT CHEW, SWALLOW OR PLACE UNDER TONGUE. 01/17/17  Yes [provider]  valsartan (DIOVAN) 160 MG tablet Take 160 mg by mouth every evening.   Yes [provider]     Review of Systems  Positive ROS: As above  All other systems have been reviewed and were otherwise negative with the exception of those mentioned in the HPI and as above.  Objective: Vital signs in last 24 hours: Temp:  [98.1 F (36.7 C)] 98.1 F (36.7 C) (07/26 0552) Pulse Rate:  [69] 69 (07/26 0552) Resp:  [18] 18 (07/26 0552) BP: (194)/(76) 194/76 (07/26 0552) SpO2:  [98 %] 98 % (07/26 0552) Weight:  [79.4 kg (175 lb)] 79.4 kg (175 lb) (07/26 0552)  General Appearance: Alert Head: Normocephalic, without obvious abnormality, atraumatic Eyes: PERRL, conjunctiva/corneas clear, EOM's intact,    Ears: Normal  Throat: Normal  Neck: She has limited cervical range of motion. Back: unremarkable Lungs: Clear to auscultation bilaterally, respirations unlabored Heart: Regular rate and rhythm, no murmur, rub or gallop Abdomen: Soft, non-tender Extremities:  Extremities normal, atraumatic, no cyanosis or edema Skin: unremarkable  NEUROLOGIC:   Mental status: alert and oriented,Motor Exam - grossly normal Sensory Exam - grossly normal Reflexes:  Coordination - grossly normal Gait - grossly normal Balance - grossly normal Cranial Nerves: I: smell Not tested  II: visual acuity  OS: Normal  OD: Normal   II: visual fields Full to confrontation  II: pupils Equal, round, reactive to light  III,VII: ptosis None  III,IV,VI: extraocular muscles  Full ROM  V: mastication Normal  V: facial light touch sensation  Normal   V,VII: corneal reflex  Present  VII: facial muscle function - upper  Normal  VII: facial muscle function - lower Normal  VIII: hearing Not tested  IX: soft palate elevation  Normal  IX,X: gag reflex Present  XI: trapezius strength  5/5  XI: sternocleidomastoid strength 5/5  XI: neck flexion strength  5/5  XII: tongue strength  Normal    Data Review Lab Results  Component Value Date   WBC 8.0 02/09/2017   HGB 14.0 02/09/2017   HCT 42.5 02/09/2017   MCV 89.5 02/09/2017   PLT 228 02/09/2017   Lab Results  Component Value Date   NA 138 02/09/2017   K 3.6 02/09/2017   CL 106 02/09/2017   CO2 24 02/09/2017   BUN 6 02/09/2017   CREATININE 0.70 02/09/2017   GLUCOSE 92 02/09/2017   No results found for: INR, PROTIME  Assessment/Plan: C1-2 facet arthropathy, cervicalgia: I have discussed the situation with the patient and her husband and reviewed the imaging studies with him. We have discussed the various treatment options including surgery. I have described the surgical treatment option of a C1-2 posterior instrumentation and fusion. I have shown her surgical models. We have discussed the risks, benefits, alternatives, expected postoperative course, and likelihood of achieving our goals with surgery. I have answered all the patient's questions. She has decided to proceed with surgery.   Alek Borges D 02/16/2017 7:25 AM

## 2017-02-16 NOTE — Progress Notes (Signed)
Subjective:  The patient is somnolent but easily arousable. She is in no apparent distress. She looks well.  Objective: Vital signs in last 24 hours: Temp:  [97.8 F (36.6 C)-98.1 F (36.7 C)] 97.8 F (36.6 C) (07/26 1045) Pulse Rate:  [69-97] 97 (07/26 1045) Resp:  [16-18] 16 (07/26 1045) BP: (150-194)/(76-83) 150/83 (07/26 1045) SpO2:  [98 %-100 %] 100 % (07/26 1045) Weight:  [79.4 kg (175 lb)] 79.4 kg (175 lb) (07/26 0552)  Intake/Output from previous day: No intake/output data recorded. Intake/Output this shift: Total I/O In: 1537 [I.V.:1537] Out: 50 [Blood:50]  Physical exam the patient is somnolent but arousable. She is moving all 4 extremities well.  Lab Results: No results for input(s): WBC, HGB, HCT, PLT in the last 72 hours. BMET No results for input(s): NA, K, CL, CO2, GLUCOSE, BUN, CREATININE, CALCIUM in the last 72 hours.  Studies/Results: No results found.  Assessment/Plan: The patient is doing well. I spoke with her husband.  LOS: 0 days     Christine Hart D 02/16/2017, 11:05 AM

## 2017-02-16 NOTE — Anesthesia Postprocedure Evaluation (Signed)
Anesthesia Post Note  Patient: Christine Hart  Procedure(s) Performed: Procedure(s) (LRB): CERVICAL 1- CERVICAL 2 POSTERIOR INSTRUMENTATION AND FUSION (N/A)     Patient location during evaluation: PACU Anesthesia Type: General Level of consciousness: awake and alert Pain management: pain level controlled Vital Signs Assessment: post-procedure vital signs reviewed and stable Respiratory status: spontaneous breathing, nonlabored ventilation, respiratory function stable and patient connected to nasal cannula oxygen Cardiovascular status: blood pressure returned to baseline and stable Postop Assessment: no signs of nausea or vomiting Anesthetic complications: no    Last Vitals:  Vitals:   02/16/17 0552 02/16/17 1045  BP: (!) 194/76 (!) 150/83  Pulse: 69 97  Resp: 18 16  Temp: 36.7 C 36.6 C    Last Pain:  Vitals:   02/16/17 0552  TempSrc: Oral  PainSc: 7     LLE Motor Response: Purposeful movement;Responds to commands (02/16/17 1045) LLE Sensation: Full sensation (02/16/17 1045) RLE Motor Response: Purposeful movement;Responds to commands (02/16/17 1045) RLE Sensation: Full sensation (02/16/17 1045)      Alishia Lebo

## 2017-02-16 NOTE — Op Note (Signed)
Brief history: The patient is a 71 year old white female who has had chronic neck pain. She has failed medical management. She was worked up with a cervical MRI which demonstrated severe C1-2 facet arthropathy. I discussed the various treatment options with the patient. We discussed the surgical treatment option of a C1-2 posterior instrumentation and fusion. The patient has weighed the risks, benefits, and alternative surgery and decided to proceed with the operation.  Preoperative diagnosis: C1-2 facet arthropathy, cervicalgia  Postop diagnosis: Same  Procedure: Posterior C1-2  arthrodesis with local morselized autograft bone, bone morphogenic protein-soaked collagen sponges, Bone graft extender; posterior C1 to instrumentation with Zimmer titanium screws and rods  Surgeon: Dr. Earle Gell  Assistant: Dr. Kathyrn Sheriff  Anesthesia: Gen. endotracheal  Estimated blood loss: 75 mL  Specimens: None  Drains: None  Complications: None  Description of procedure: The patient was brought to the operating room by the anesthesia team. General endotracheal anesthesia was induced. I applied the Mayfield 3 point headrest to the patient's calvarium. The patient was carefully turned to the prone position on the chest rolls. The patient's suboccipital region was then shaved with clippers in this region as well as the posterior neck and upper thorax was prepared with Betadine scrub and Betadine solution. Sterile drapes were applied. I then injected the area to be incised with Marcaine with epinephrine solution. Patient scalpel to make a linear midline incision in the upper cervical region. A few select cautery to perform a bilateral subperiosteal dissection exposing the C1 ring and the C2 spinous process. We used intraoperative fluoroscopy to confirm our location. I inserted the McCullough retractor for exposure. I used cautery to close the C2-3 facet and the lateral edges of the C1 ring. I identified the exiting  C2 nerve root. We dissected cephalad to the nerve root and identified the C1 lateral masses bilaterally. Under fluoroscopic guidance I drilled a pilot hole into the bilateral C1 lateral masses. We probed inside the pilot hole to cortical breaches there were none. I hole and inserted a 20 mm attaining polyaxial screw into the C1 lateral masses bilaterally. We got bony purchase. I then used the drill guide to drill a 18 mm hole into the C2 pars bilaterally. Again we used the ball probe to rule out cortical breaches. I tapped the hole and inserted an 18 mm polyp polyaxial screw into the C2 pars bilaterally. We confirmed good position of the screws with AP and lateral fluoroscopy. We then connected the lateral screws with a rod and secured in place with the caps.  This completed the instrumentation  We now turned our attention to the arthrodesis. I used a high-speed drill to decorticate the lateral C1 lamina and the C2 lamina. We laid accommodation of local morselized order a bone we obtained during drilling, bone morphogenic protein-soaked collagen sponges, and Bone graft extender over these decorticate  structures at C1-2, completing the arthrodesis.  We then obtained hemostasis using bipolar cautery. We removed the retractors. We reapproximated patient's cervical thoracic fascia with interrupted 0 Vicryl suture. We reapproximated the subcutaneous tissue with interrupted 2-0 Vicryl suture. We were approximate the skin with Steri-Strips and benzoin. The wound was then coated with bacitracin ointment. A sterile dressing was applied. The drapes were removed. The patient was returned to the supine position. I then removed the Mayfield 3 point headrest from the patient's calvarium. By report all sponge, instrument, and needle counts were correct at the end of this case.

## 2017-02-16 NOTE — Anesthesia Procedure Notes (Signed)
Procedure Name: Intubation Date/Time: 02/16/2017 7:35 AM Performed by: Neldon Newport Pre-anesthesia Checklist: Timeout performed, Patient being monitored, Suction available, Emergency Drugs available and Patient identified Patient Re-evaluated:Patient Re-evaluated prior to induction Oxygen Delivery Method: Circle system utilized Preoxygenation: Pre-oxygenation with 100% oxygen Induction Type: IV induction Ventilation: Mask ventilation without difficulty Laryngoscope Size: 3 and Glidescope Grade View: Grade I Tube type: Oral Tube size: 7.0 mm Number of attempts: 1 Placement Confirmation: breath sounds checked- equal and bilateral,  positive ETCO2 and ETT inserted through vocal cords under direct vision Secured at: 22 cm Tube secured with: Tape Dental Injury: Teeth and Oropharynx as per pre-operative assessment

## 2017-02-17 MED ORDER — METHOCARBAMOL 500 MG PO TABS
500.0000 mg | ORAL_TABLET | Freq: Four times a day (QID) | ORAL | Status: DC | PRN
  Administered 2017-02-17: 500 mg via ORAL

## 2017-02-17 MED ORDER — OXYCODONE HCL 5 MG PO TABS: 5.0000 mg | ORAL_TABLET | ORAL | 0 refills | Status: DC | PRN

## 2017-02-17 MED ORDER — CYCLOBENZAPRINE HCL 10 MG PO TABS: 10.0000 mg | ORAL_TABLET | Freq: Three times a day (TID) | ORAL | 1 refills | Status: DC | PRN

## 2017-02-17 MED ORDER — METHOCARBAMOL 1000 MG/10ML IJ SOLN
500.0000 mg | Freq: Four times a day (QID) | INTRAVENOUS | Status: DC | PRN
  Filled 2017-02-17: qty 5

## 2017-02-17 MED ORDER — DOCUSATE SODIUM 100 MG PO CAPS: 100.0000 mg | ORAL_CAPSULE | Freq: Two times a day (BID) | ORAL | 0 refills | Status: DC

## 2017-02-17 MED ORDER — CYCLOBENZAPRINE HCL 10 MG PO TABS: 10.0000 mg | ORAL_TABLET | Freq: Three times a day (TID) | ORAL | Status: DC | PRN

## 2017-02-17 NOTE — Discharge Summary (Signed)
Physician Discharge Summary  Patient ID: Christine Hart MRN: [id number] DOB/AGE: [birth year] 71 y.o.  Admit date: 02/16/2017 Discharge date: 02/17/2017  Admission Diagnoses:C1 - 2 arthropathy, cervicalgia  Discharge Diagnoses: The same Active Problems:   Arthropathy of cervical facet joint [facility])   Discharged Condition: good  Hospital Course: I performed a C1-2 posterior instrumentation and fusion on the patient on 02/16/2017. The surgery went well.  The patient's postoperative course was unremarkable. On postoperative day #1 she requested discharge home. She was given written and oral discharge instructions. All her questions were answered.  Consults: None Significant Diagnostic Studies: None Treatments: C1-2 posterior instrumentation and fusion. Discharge Exam: Blood pressure (!) 134/58, pulse 65, temperature 98.7 F (37.1 C), temperature source Oral, resp. rate 18, height 5' 6.5" (1.689 m), weight 79.4 kg (175 lb), SpO2 100 %. The patient is alert and pleasant. Her dressing is clean and dry. Her strength is normal in all 4 extremities.  Disposition: Home  Discharge Instructions    Call MD for:  difficulty breathing, headache or visual disturbances    Complete by:  As directed    Call MD for:  extreme fatigue    Complete by:  As directed    Call MD for:  hives    Complete by:  As directed    Call MD for:  persistant dizziness or light-headedness    Complete by:  As directed    Call MD for:  persistant nausea and vomiting    Complete by:  As directed    Call MD for:  redness, tenderness, or signs of infection (pain, swelling, redness, odor or green/yellow discharge around incision site)    Complete by:  As directed    Call MD for:  severe uncontrolled pain    Complete by:  As directed    Call MD for:  temperature >100.4    Complete by:  As directed    Diet - low sodium heart healthy    Complete by:  As directed    Discharge instructions    Complete by:  As directed    Call [phone] for a followup appointment. Take a stool softener while you are using pain medications.   Driving Restrictions    Complete by:  As directed    Do not drive for 2 weeks.   Increase activity slowly    Complete by:  As directed    Lifting restrictions    Complete by:  As directed    Do not lift more than 5 pounds. No excessive bending or twisting.   May shower / Bathe    Complete by:  As directed    He may shower after the pain she is removed 3 days after surgery. Leave the incision alone.   Remove dressing in 48 hours    Complete by:  As directed    Your stitches are under the scan and will dissolve by themselves. The Steri-Strips will fall off after you take a few showers. Do not rub back or pick at the wound, Leave the wound alone.     Allergies as of 02/17/2017      Reactions   No Known Allergies       Medication List    STOP taking these medications   baclofen 10 MG tablet Commonly known as:  LIORESAL   ibuprofen 200 MG tablet Commonly known as:  ADVIL,MOTRIN   tiZANidine 4 MG capsule Commonly known as:  ZANAFLEX     TAKE these medications   amLODipine  5 MG tablet Commonly known as:  NORVASC Take 5 mg by mouth every evening.   aspirin EC 81 MG tablet Take 81 mg by mouth daily.   cyclobenzaprine 10 MG tablet Commonly known as:  FLEXERIL Take 1 tablet (10 mg total) by mouth 3 (three) times daily as needed for muscle spasms.   docusate sodium 100 MG capsule Commonly known as:  COLACE Take 1 capsule (100 mg total) by mouth 2 (two) times daily.   finasteride 1 MG tablet Commonly known as:  PROPECIA Take 0.5 mg by mouth every other day. Takes 0.5 tablet every other day   oxyCODONE 5 MG immediate release tablet Commonly known as:  Oxy IR/ROXICODONE Take 1-2 tablets (5-10 mg total) by mouth every 3 (three) hours as needed for breakthrough pain.   PROBIOTIC PO Take 2 each by mouth daily.   traMADol 50 MG tablet Commonly known as:  ULTRAM Take  50 mg by mouth 3 (three) times daily as needed for moderate pain.   UNABLE TO FIND DISSOLVE 1/2 TROCHE BETWEEN UPPER GUM LINE AND CHEEK TWICE DAILY. DO NOT CHEW, SWALLOW OR PLACE UNDER TONGUE.   valsartan 160 MG tablet Commonly known as:  DIOVAN Take 160 mg by mouth every evening.   Vitamin D3 2000 units Tabs Take 2,000 Units by mouth daily.        SignedOphelia Charter 02/17/2017, 6:41 AM

## 2017-02-17 NOTE — Progress Notes (Signed)
Patient alert and oriented, mae's well, voiding adequate amount of urine, swallowing without difficulty, no c/o pain at time of discharge. Patient discharged home with family. Script and discharged instructions given to patient. Patient and family stated understanding of instructions given. Patient has an appointment with Dr. Jenkins   

## 2017-02-20 ENCOUNTER — Encounter (HOSPITAL_COMMUNITY): Payer: Self-pay | Admitting: Neurosurgery

## 2017-03-07 DIAGNOSIS — M1288 Other specific arthropathies, not elsewhere classified, other specified site: Secondary | ICD-10-CM | POA: Diagnosis not present

## 2017-05-30 DIAGNOSIS — M542 Cervicalgia: Secondary | ICD-10-CM | POA: Diagnosis not present

## 2017-05-30 DIAGNOSIS — M1288 Other specific arthropathies, not elsewhere classified, other specified site: Secondary | ICD-10-CM | POA: Diagnosis not present

## 2017-07-26 DIAGNOSIS — I10 Essential (primary) hypertension: Secondary | ICD-10-CM | POA: Diagnosis not present

## 2017-07-26 DIAGNOSIS — J069 Acute upper respiratory infection, unspecified: Secondary | ICD-10-CM | POA: Diagnosis not present

## 2017-08-02 DIAGNOSIS — D229 Melanocytic nevi, unspecified: Secondary | ICD-10-CM | POA: Diagnosis not present

## 2017-08-02 DIAGNOSIS — L820 Inflamed seborrheic keratosis: Secondary | ICD-10-CM | POA: Diagnosis not present

## 2017-08-02 DIAGNOSIS — Z85828 Personal history of other malignant neoplasm of skin: Secondary | ICD-10-CM | POA: Diagnosis not present

## 2017-08-02 DIAGNOSIS — D485 Neoplasm of uncertain behavior of skin: Secondary | ICD-10-CM | POA: Diagnosis not present

## 2017-09-08 DIAGNOSIS — E782 Mixed hyperlipidemia: Secondary | ICD-10-CM | POA: Diagnosis not present

## 2017-09-08 DIAGNOSIS — Z6829 Body mass index (BMI) 29.0-29.9, adult: Secondary | ICD-10-CM | POA: Diagnosis not present

## 2017-09-08 DIAGNOSIS — Z1331 Encounter for screening for depression: Secondary | ICD-10-CM | POA: Diagnosis not present

## 2017-09-08 DIAGNOSIS — Z0000 Encounter for general adult medical examination without abnormal findings: Secondary | ICD-10-CM | POA: Diagnosis not present

## 2017-09-08 DIAGNOSIS — M8580 Other specified disorders of bone density and structure, unspecified site: Secondary | ICD-10-CM | POA: Diagnosis not present

## 2017-09-08 DIAGNOSIS — E559 Vitamin D deficiency, unspecified: Secondary | ICD-10-CM | POA: Diagnosis not present

## 2017-09-08 DIAGNOSIS — R002 Palpitations: Secondary | ICD-10-CM | POA: Diagnosis not present

## 2017-09-08 DIAGNOSIS — I10 Essential (primary) hypertension: Secondary | ICD-10-CM | POA: Diagnosis not present

## 2017-09-08 DIAGNOSIS — Z79899 Other long term (current) drug therapy: Secondary | ICD-10-CM | POA: Diagnosis not present

## 2017-09-28 DIAGNOSIS — R002 Palpitations: Secondary | ICD-10-CM | POA: Diagnosis not present

## 2017-10-06 DIAGNOSIS — Z6829 Body mass index (BMI) 29.0-29.9, adult: Secondary | ICD-10-CM | POA: Diagnosis not present

## 2017-10-06 DIAGNOSIS — F419 Anxiety disorder, unspecified: Secondary | ICD-10-CM | POA: Diagnosis not present

## 2017-10-06 DIAGNOSIS — M542 Cervicalgia: Secondary | ICD-10-CM | POA: Diagnosis not present

## 2017-10-06 DIAGNOSIS — I471 Supraventricular tachycardia: Secondary | ICD-10-CM | POA: Diagnosis not present

## 2017-11-28 DIAGNOSIS — Z6827 Body mass index (BMI) 27.0-27.9, adult: Secondary | ICD-10-CM | POA: Diagnosis not present

## 2017-11-28 DIAGNOSIS — I10 Essential (primary) hypertension: Secondary | ICD-10-CM | POA: Diagnosis not present

## 2017-11-28 DIAGNOSIS — M542 Cervicalgia: Secondary | ICD-10-CM | POA: Diagnosis not present

## 2017-11-28 DIAGNOSIS — M1288 Other specific arthropathies, not elsewhere classified, other specified site: Secondary | ICD-10-CM | POA: Diagnosis not present

## 2018-01-10 DIAGNOSIS — Z01419 Encounter for gynecological examination (general) (routine) without abnormal findings: Secondary | ICD-10-CM | POA: Diagnosis not present

## 2018-01-10 DIAGNOSIS — Z6827 Body mass index (BMI) 27.0-27.9, adult: Secondary | ICD-10-CM | POA: Diagnosis not present

## 2018-01-10 DIAGNOSIS — Z1231 Encounter for screening mammogram for malignant neoplasm of breast: Secondary | ICD-10-CM | POA: Diagnosis not present

## 2018-02-09 DIAGNOSIS — N951 Menopausal and female climacteric states: Secondary | ICD-10-CM | POA: Diagnosis not present

## 2018-02-15 DIAGNOSIS — Z6829 Body mass index (BMI) 29.0-29.9, adult: Secondary | ICD-10-CM | POA: Diagnosis not present

## 2018-02-15 DIAGNOSIS — H109 Unspecified conjunctivitis: Secondary | ICD-10-CM | POA: Diagnosis not present

## 2018-03-08 DIAGNOSIS — M67979 Unspecified disorder of synovium and tendon, unspecified ankle and foot: Secondary | ICD-10-CM | POA: Diagnosis not present

## 2018-03-08 DIAGNOSIS — H1030 Unspecified acute conjunctivitis, unspecified eye: Secondary | ICD-10-CM | POA: Diagnosis not present

## 2018-03-08 DIAGNOSIS — Z6829 Body mass index (BMI) 29.0-29.9, adult: Secondary | ICD-10-CM | POA: Diagnosis not present

## 2018-03-28 DIAGNOSIS — H10413 Chronic giant papillary conjunctivitis, bilateral: Secondary | ICD-10-CM | POA: Diagnosis not present

## 2018-04-23 DIAGNOSIS — H2513 Age-related nuclear cataract, bilateral: Secondary | ICD-10-CM | POA: Diagnosis not present

## 2018-04-23 DIAGNOSIS — H524 Presbyopia: Secondary | ICD-10-CM | POA: Diagnosis not present

## 2018-06-13 DIAGNOSIS — Z1211 Encounter for screening for malignant neoplasm of colon: Secondary | ICD-10-CM | POA: Diagnosis not present

## 2018-10-10 DIAGNOSIS — E782 Mixed hyperlipidemia: Secondary | ICD-10-CM | POA: Diagnosis not present

## 2018-10-10 DIAGNOSIS — L820 Inflamed seborrheic keratosis: Secondary | ICD-10-CM | POA: Diagnosis not present

## 2018-10-10 DIAGNOSIS — E559 Vitamin D deficiency, unspecified: Secondary | ICD-10-CM | POA: Diagnosis not present

## 2018-10-10 DIAGNOSIS — Z1331 Encounter for screening for depression: Secondary | ICD-10-CM | POA: Diagnosis not present

## 2018-10-10 DIAGNOSIS — E663 Overweight: Secondary | ICD-10-CM | POA: Diagnosis not present

## 2018-10-10 DIAGNOSIS — L669 Cicatricial alopecia, unspecified: Secondary | ICD-10-CM | POA: Diagnosis not present

## 2018-10-10 DIAGNOSIS — L814 Other melanin hyperpigmentation: Secondary | ICD-10-CM | POA: Diagnosis not present

## 2018-10-10 DIAGNOSIS — N951 Menopausal and female climacteric states: Secondary | ICD-10-CM | POA: Diagnosis not present

## 2018-10-10 DIAGNOSIS — Z6829 Body mass index (BMI) 29.0-29.9, adult: Secondary | ICD-10-CM | POA: Diagnosis not present

## 2018-10-10 DIAGNOSIS — D485 Neoplasm of uncertain behavior of skin: Secondary | ICD-10-CM | POA: Diagnosis not present

## 2018-10-10 DIAGNOSIS — Z0000 Encounter for general adult medical examination without abnormal findings: Secondary | ICD-10-CM | POA: Diagnosis not present

## 2018-10-10 DIAGNOSIS — I10 Essential (primary) hypertension: Secondary | ICD-10-CM | POA: Diagnosis not present

## 2018-10-10 DIAGNOSIS — F419 Anxiety disorder, unspecified: Secondary | ICD-10-CM | POA: Diagnosis not present

## 2018-10-10 DIAGNOSIS — Z9181 History of falling: Secondary | ICD-10-CM | POA: Diagnosis not present

## 2018-10-10 DIAGNOSIS — M542 Cervicalgia: Secondary | ICD-10-CM | POA: Diagnosis not present

## 2018-10-10 DIAGNOSIS — I471 Supraventricular tachycardia: Secondary | ICD-10-CM | POA: Diagnosis not present

## 2018-10-12 DIAGNOSIS — R739 Hyperglycemia, unspecified: Secondary | ICD-10-CM | POA: Diagnosis not present

## 2018-10-12 DIAGNOSIS — E782 Mixed hyperlipidemia: Secondary | ICD-10-CM | POA: Diagnosis not present

## 2018-10-12 DIAGNOSIS — E889 Metabolic disorder, unspecified: Secondary | ICD-10-CM | POA: Diagnosis not present

## 2018-10-12 DIAGNOSIS — Z1159 Encounter for screening for other viral diseases: Secondary | ICD-10-CM | POA: Diagnosis not present

## 2018-10-12 DIAGNOSIS — Z79899 Other long term (current) drug therapy: Secondary | ICD-10-CM | POA: Diagnosis not present

## 2018-12-11 DIAGNOSIS — S60511A Abrasion of right hand, initial encounter: Secondary | ICD-10-CM | POA: Diagnosis not present

## 2018-12-25 DIAGNOSIS — M1288 Other specific arthropathies, not elsewhere classified, other specified site: Secondary | ICD-10-CM | POA: Diagnosis not present

## 2018-12-25 DIAGNOSIS — M542 Cervicalgia: Secondary | ICD-10-CM | POA: Diagnosis not present

## 2019-01-04 DIAGNOSIS — H906 Mixed conductive and sensorineural hearing loss, bilateral: Secondary | ICD-10-CM | POA: Diagnosis not present

## 2019-01-14 DIAGNOSIS — H906 Mixed conductive and sensorineural hearing loss, bilateral: Secondary | ICD-10-CM | POA: Diagnosis not present

## 2019-05-16 DIAGNOSIS — Z1231 Encounter for screening mammogram for malignant neoplasm of breast: Secondary | ICD-10-CM | POA: Diagnosis not present

## 2019-05-16 DIAGNOSIS — Z01419 Encounter for gynecological examination (general) (routine) without abnormal findings: Secondary | ICD-10-CM | POA: Diagnosis not present

## 2019-05-28 DIAGNOSIS — I471 Supraventricular tachycardia: Secondary | ICD-10-CM | POA: Diagnosis not present

## 2019-05-28 DIAGNOSIS — E782 Mixed hyperlipidemia: Secondary | ICD-10-CM | POA: Diagnosis not present

## 2019-05-28 DIAGNOSIS — E559 Vitamin D deficiency, unspecified: Secondary | ICD-10-CM | POA: Diagnosis not present

## 2019-05-28 DIAGNOSIS — K589 Irritable bowel syndrome without diarrhea: Secondary | ICD-10-CM | POA: Diagnosis not present

## 2019-05-28 DIAGNOSIS — I10 Essential (primary) hypertension: Secondary | ICD-10-CM | POA: Diagnosis not present

## 2019-05-28 DIAGNOSIS — M542 Cervicalgia: Secondary | ICD-10-CM | POA: Diagnosis not present

## 2019-05-28 DIAGNOSIS — F419 Anxiety disorder, unspecified: Secondary | ICD-10-CM | POA: Diagnosis not present

## 2019-05-28 DIAGNOSIS — Z79899 Other long term (current) drug therapy: Secondary | ICD-10-CM | POA: Diagnosis not present

## 2019-07-16 DIAGNOSIS — I10 Essential (primary) hypertension: Secondary | ICD-10-CM | POA: Diagnosis not present

## 2019-07-16 DIAGNOSIS — Z6828 Body mass index (BMI) 28.0-28.9, adult: Secondary | ICD-10-CM | POA: Diagnosis not present

## 2019-07-16 DIAGNOSIS — M542 Cervicalgia: Secondary | ICD-10-CM | POA: Diagnosis not present

## 2019-08-28 DIAGNOSIS — J989 Respiratory disorder, unspecified: Secondary | ICD-10-CM | POA: Diagnosis not present

## 2019-08-28 DIAGNOSIS — J029 Acute pharyngitis, unspecified: Secondary | ICD-10-CM | POA: Diagnosis not present

## 2020-01-14 DIAGNOSIS — M542 Cervicalgia: Secondary | ICD-10-CM | POA: Diagnosis not present

## 2020-01-14 DIAGNOSIS — M47812 Spondylosis without myelopathy or radiculopathy, cervical region: Secondary | ICD-10-CM | POA: Diagnosis not present

## 2020-02-05 DIAGNOSIS — L820 Inflamed seborrheic keratosis: Secondary | ICD-10-CM | POA: Diagnosis not present

## 2020-06-06 DIAGNOSIS — M25562 Pain in left knee: Secondary | ICD-10-CM | POA: Diagnosis not present

## 2020-06-06 DIAGNOSIS — M1712 Unilateral primary osteoarthritis, left knee: Secondary | ICD-10-CM | POA: Diagnosis not present

## 2020-06-09 DIAGNOSIS — G8929 Other chronic pain: Secondary | ICD-10-CM | POA: Diagnosis not present

## 2020-06-09 DIAGNOSIS — M25562 Pain in left knee: Secondary | ICD-10-CM | POA: Diagnosis not present

## 2020-06-09 DIAGNOSIS — M1712 Unilateral primary osteoarthritis, left knee: Secondary | ICD-10-CM | POA: Diagnosis not present

## 2020-06-09 DIAGNOSIS — M25462 Effusion, left knee: Secondary | ICD-10-CM | POA: Diagnosis not present

## 2020-07-21 DIAGNOSIS — M1712 Unilateral primary osteoarthritis, left knee: Secondary | ICD-10-CM | POA: Diagnosis not present

## 2020-07-21 DIAGNOSIS — M25552 Pain in left hip: Secondary | ICD-10-CM | POA: Diagnosis not present

## 2020-07-21 DIAGNOSIS — M533 Sacrococcygeal disorders, not elsewhere classified: Secondary | ICD-10-CM | POA: Diagnosis not present

## 2020-07-21 DIAGNOSIS — M47816 Spondylosis without myelopathy or radiculopathy, lumbar region: Secondary | ICD-10-CM | POA: Diagnosis not present

## 2020-07-21 DIAGNOSIS — M25551 Pain in right hip: Secondary | ICD-10-CM | POA: Diagnosis not present

## 2020-07-21 DIAGNOSIS — M160 Bilateral primary osteoarthritis of hip: Secondary | ICD-10-CM | POA: Diagnosis not present

## 2020-07-23 DIAGNOSIS — M1991 Primary osteoarthritis, unspecified site: Secondary | ICD-10-CM | POA: Diagnosis not present

## 2020-07-23 DIAGNOSIS — N951 Menopausal and female climacteric states: Secondary | ICD-10-CM | POA: Diagnosis not present

## 2020-07-23 DIAGNOSIS — E782 Mixed hyperlipidemia: Secondary | ICD-10-CM | POA: Diagnosis not present

## 2020-07-23 DIAGNOSIS — M542 Cervicalgia: Secondary | ICD-10-CM | POA: Diagnosis not present

## 2020-07-23 DIAGNOSIS — I471 Supraventricular tachycardia: Secondary | ICD-10-CM | POA: Diagnosis not present

## 2020-07-23 DIAGNOSIS — I10 Essential (primary) hypertension: Secondary | ICD-10-CM | POA: Diagnosis not present

## 2020-07-23 DIAGNOSIS — Z0000 Encounter for general adult medical examination without abnormal findings: Secondary | ICD-10-CM | POA: Diagnosis not present

## 2020-07-23 DIAGNOSIS — R7302 Impaired glucose tolerance (oral): Secondary | ICD-10-CM | POA: Diagnosis not present

## 2020-07-23 DIAGNOSIS — Z79899 Other long term (current) drug therapy: Secondary | ICD-10-CM | POA: Diagnosis not present

## 2020-07-23 DIAGNOSIS — F411 Generalized anxiety disorder: Secondary | ICD-10-CM | POA: Diagnosis not present

## 2020-07-23 DIAGNOSIS — K589 Irritable bowel syndrome without diarrhea: Secondary | ICD-10-CM | POA: Diagnosis not present

## 2020-07-23 DIAGNOSIS — E663 Overweight: Secondary | ICD-10-CM | POA: Diagnosis not present

## 2020-07-29 DIAGNOSIS — M6281 Muscle weakness (generalized): Secondary | ICD-10-CM | POA: Diagnosis not present

## 2020-08-04 DIAGNOSIS — M6281 Muscle weakness (generalized): Secondary | ICD-10-CM | POA: Diagnosis not present

## 2020-08-11 DIAGNOSIS — M6281 Muscle weakness (generalized): Secondary | ICD-10-CM | POA: Diagnosis not present

## 2020-08-19 DIAGNOSIS — M6281 Muscle weakness (generalized): Secondary | ICD-10-CM | POA: Diagnosis not present

## 2020-08-26 DIAGNOSIS — M6281 Muscle weakness (generalized): Secondary | ICD-10-CM | POA: Diagnosis not present

## 2020-09-02 DIAGNOSIS — M6281 Muscle weakness (generalized): Secondary | ICD-10-CM | POA: Diagnosis not present

## 2020-09-08 DIAGNOSIS — M1712 Unilateral primary osteoarthritis, left knee: Secondary | ICD-10-CM | POA: Diagnosis not present

## 2020-09-09 DIAGNOSIS — M6281 Muscle weakness (generalized): Secondary | ICD-10-CM | POA: Diagnosis not present

## 2020-09-16 ENCOUNTER — Other Ambulatory Visit: Payer: Self-pay | Admitting: Orthopedic Surgery

## 2020-09-16 DIAGNOSIS — M2352 Chronic instability of knee, left knee: Secondary | ICD-10-CM

## 2020-09-16 DIAGNOSIS — M25562 Pain in left knee: Secondary | ICD-10-CM

## 2020-09-16 DIAGNOSIS — G8929 Other chronic pain: Secondary | ICD-10-CM

## 2020-10-01 ENCOUNTER — Other Ambulatory Visit: Payer: Self-pay

## 2020-10-01 ENCOUNTER — Ambulatory Visit
Admission: RE | Admit: 2020-10-01 | Discharge: 2020-10-01 | Disposition: A | Discharge status: Home / Self Care | Payer: PPO | Source: Ambulatory Visit | Attending: Orthopedic Surgery | Admitting: Orthopedic Surgery

## 2020-10-01 DIAGNOSIS — M2352 Chronic instability of knee, left knee: Secondary | ICD-10-CM

## 2020-10-01 DIAGNOSIS — M25562 Pain in left knee: Secondary | ICD-10-CM | POA: Diagnosis not present

## 2020-10-01 DIAGNOSIS — G8929 Other chronic pain: Secondary | ICD-10-CM

## 2020-10-06 DIAGNOSIS — S83242A Other tear of medial meniscus, current injury, left knee, initial encounter: Secondary | ICD-10-CM | POA: Diagnosis not present

## 2020-10-06 DIAGNOSIS — M2352 Chronic instability of knee, left knee: Secondary | ICD-10-CM | POA: Diagnosis not present

## 2020-10-14 DIAGNOSIS — M6752 Plica syndrome, left knee: Secondary | ICD-10-CM | POA: Diagnosis not present

## 2020-10-14 DIAGNOSIS — M94262 Chondromalacia, left knee: Secondary | ICD-10-CM | POA: Diagnosis not present

## 2020-10-14 DIAGNOSIS — M65862 Other synovitis and tenosynovitis, left lower leg: Secondary | ICD-10-CM | POA: Diagnosis not present

## 2020-10-14 DIAGNOSIS — Y999 Unspecified external cause status: Secondary | ICD-10-CM | POA: Diagnosis not present

## 2020-10-14 DIAGNOSIS — S83232A Complex tear of medial meniscus, current injury, left knee, initial encounter: Secondary | ICD-10-CM | POA: Diagnosis not present

## 2020-10-14 DIAGNOSIS — M659 Synovitis and tenosynovitis, unspecified: Secondary | ICD-10-CM | POA: Diagnosis not present

## 2020-10-14 DIAGNOSIS — X58XXXA Exposure to other specified factors, initial encounter: Secondary | ICD-10-CM | POA: Diagnosis not present

## 2020-10-14 DIAGNOSIS — G8918 Other acute postprocedural pain: Secondary | ICD-10-CM | POA: Diagnosis not present

## 2020-10-21 DIAGNOSIS — M25662 Stiffness of left knee, not elsewhere classified: Secondary | ICD-10-CM | POA: Diagnosis not present

## 2020-10-21 DIAGNOSIS — M25562 Pain in left knee: Secondary | ICD-10-CM | POA: Diagnosis not present

## 2020-10-27 DIAGNOSIS — M25562 Pain in left knee: Secondary | ICD-10-CM | POA: Diagnosis not present

## 2020-10-27 DIAGNOSIS — M25662 Stiffness of left knee, not elsewhere classified: Secondary | ICD-10-CM | POA: Diagnosis not present

## 2020-10-29 DIAGNOSIS — M25562 Pain in left knee: Secondary | ICD-10-CM | POA: Diagnosis not present

## 2020-10-29 DIAGNOSIS — M25662 Stiffness of left knee, not elsewhere classified: Secondary | ICD-10-CM | POA: Diagnosis not present

## 2020-11-05 DIAGNOSIS — I471 Supraventricular tachycardia: Secondary | ICD-10-CM | POA: Diagnosis not present

## 2020-11-05 DIAGNOSIS — Z6830 Body mass index (BMI) 30.0-30.9, adult: Secondary | ICD-10-CM | POA: Diagnosis not present

## 2020-11-05 DIAGNOSIS — E669 Obesity, unspecified: Secondary | ICD-10-CM | POA: Diagnosis not present

## 2020-11-05 DIAGNOSIS — R55 Syncope and collapse: Secondary | ICD-10-CM | POA: Diagnosis not present

## 2020-12-16 DIAGNOSIS — I471 Supraventricular tachycardia: Secondary | ICD-10-CM | POA: Diagnosis not present

## 2020-12-16 DIAGNOSIS — J329 Chronic sinusitis, unspecified: Secondary | ICD-10-CM | POA: Diagnosis not present

## 2020-12-16 DIAGNOSIS — Z20828 Contact with and (suspected) exposure to other viral communicable diseases: Secondary | ICD-10-CM | POA: Diagnosis not present

## 2020-12-16 DIAGNOSIS — J40 Bronchitis, not specified as acute or chronic: Secondary | ICD-10-CM | POA: Diagnosis not present

## 2021-01-12 DIAGNOSIS — Z981 Arthrodesis status: Secondary | ICD-10-CM | POA: Diagnosis not present

## 2021-01-12 DIAGNOSIS — M542 Cervicalgia: Secondary | ICD-10-CM | POA: Diagnosis not present

## 2021-03-23 DIAGNOSIS — M1711 Unilateral primary osteoarthritis, right knee: Secondary | ICD-10-CM | POA: Diagnosis not present

## 2021-03-23 DIAGNOSIS — M25561 Pain in right knee: Secondary | ICD-10-CM | POA: Diagnosis not present

## 2021-03-23 DIAGNOSIS — M11261 Other chondrocalcinosis, right knee: Secondary | ICD-10-CM | POA: Diagnosis not present

## 2021-05-11 DIAGNOSIS — M7051 Other bursitis of knee, right knee: Secondary | ICD-10-CM | POA: Diagnosis not present

## 2021-05-11 DIAGNOSIS — M1711 Unilateral primary osteoarthritis, right knee: Secondary | ICD-10-CM | POA: Diagnosis not present

## 2021-05-23 ENCOUNTER — Ambulatory Visit (INDEPENDENT_AMBULATORY_CARE_PROVIDER_SITE_OTHER): Payer: PPO

## 2021-05-23 ENCOUNTER — Ambulatory Visit (HOSPITAL_COMMUNITY)
Admission: EM | Admit: 2021-05-23 | Discharge: 2021-05-23 | Disposition: A | Discharge status: Home / Self Care | Payer: PPO | Attending: Emergency Medicine | Admitting: Emergency Medicine

## 2021-05-23 ENCOUNTER — Other Ambulatory Visit: Payer: Self-pay

## 2021-05-23 ENCOUNTER — Encounter (HOSPITAL_COMMUNITY): Payer: Self-pay | Admitting: *Deleted

## 2021-05-23 DIAGNOSIS — J101 Influenza due to other identified influenza virus with other respiratory manifestations: Secondary | ICD-10-CM

## 2021-05-23 DIAGNOSIS — R059 Cough, unspecified: Secondary | ICD-10-CM | POA: Diagnosis not present

## 2021-05-23 DIAGNOSIS — R509 Fever, unspecified: Secondary | ICD-10-CM | POA: Diagnosis not present

## 2021-05-23 LAB — POC INFLUENZA A AND B ANTIGEN (URGENT CARE ONLY)
INFLUENZA A ANTIGEN, POC: POSITIVE — AB
INFLUENZA B ANTIGEN, POC: NEGATIVE

## 2021-05-23 MED ORDER — OSELTAMIVIR PHOSPHATE 75 MG PO CAPS: 75.0000 mg | ORAL_CAPSULE | Freq: Two times a day (BID) | ORAL | 0 refills | Status: DC

## 2021-05-23 NOTE — Discharge Instructions (Signed)
Take the Tamiflu 1 pill twice a day for the next 5 days.  You may also use elderberry to help with symptoms.  You can take Tylenol and/or Ibuprofen as needed for fever reduction and pain relief.   For cough: honey 1/2 to 1 teaspoon (you can dilute the honey in water or another fluid).  You can also use guaifenesin and dextromethorphan for cough. You can use a humidifier for chest congestion and cough.  If you don't have a humidifier, you can sit in the bathroom with the hot shower running.     For sore throat: try warm salt water gargles, cepacol lozenges, throat spray, warm tea or water with lemon/honey, popsicles or ice, or OTC cold relief medicine for throat discomfort.    For congestion: take a daily anti-histamine like Zyrtec, Claritin, and a oral decongestant, such as pseudoephedrine.  You can also use Flonase 1-2 sprays in each nostril daily.    It is important to stay hydrated: drink plenty of fluids (water, gatorade/powerade/pedialyte, juices, or teas) to keep your throat moisturized and help further relieve irritation/discomfort.   Return or go to the Emergency Department if symptoms worsen or do not improve in the next few days.

## 2021-05-23 NOTE — ED Provider Notes (Signed)
[place]    CSN: [id number] Arrival date & time: 05/23/21  1256      History   Chief Complaint Chief Complaint  Patient presents with   Cough   Sore Throat   Muscle Pain    HPI Christine Hart is a 75 y.o. female.   Patient here for evaluation of cough and congestion that has been ongoing for the past 2 weeks.  Reports symptoms initially started as a sore throat but that has improved.  Reports granddaughter recently tested positive for RSV and patient does watch her during the week.  Reports taking OTC medications with minimal symptom relief.  Reports having fever at home, temperature 100.4 in office.  Denies any trauma, injury, or other precipitating event.  Denies any specific alleviating or aggravating factors.  Denies any chest pain, N/V/D, numbness, tingling, abdominal pain, or headaches.    The history is provided by the patient.  Cough Associated symptoms: fever, myalgias and shortness of breath   Sore Throat Associated symptoms include shortness of breath.  Muscle Pain Associated symptoms include shortness of breath.   Past Medical History:  Diagnosis Date   Arthritis    Complication of anesthesia    Limited mouth opening and neck mobility, s/p TMJ surgeries    Hypertension    MVP (mitral valve prolapse)    PONV (postoperative nausea and vomiting)     Patient Active Problem List   Diagnosis Date Noted   Arthropathy of cervical facet joint 02/16/2017    Past Surgical History:  Procedure Laterality Date   ABDOMINAL HYSTERECTOMY     APPENDECTOMY     CERVICAL SPINE SURGERY     FIRST RIB REMOVAL Right    HAND SURGERY Bilateral    NEUROMA SURGERY Left    POSTERIOR CERVICAL FUSION/FORAMINOTOMY N/A 02/16/2017   Procedure: CERVICAL 1- CERVICAL 2 POSTERIOR INSTRUMENTATION AND FUSION;  Surgeon: Newman Pies, MD;  Location: [facility];  Service: Neurosurgery;  Laterality: N/A;  CERVICAL 1- CERVICAL 2 POSTERIOR INSTRUMENTATION AND FUSION   TMJ  ARTHROPLASTY      OB History   No obstetric history on file.      Home Medications    Prior to Admission medications   Medication Sig Start Date End Date Taking? Authorizing Provider  oseltamivir (TAMIFLU) 75 MG capsule Take 1 capsule (75 mg total) by mouth every 12 (twelve) hours. 05/23/21  Yes Pearson Forster, NP  amLODipine (NORVASC) 5 MG tablet Take 5 mg by mouth every evening.    [provider]  aspirin EC 81 MG tablet Take 81 mg by mouth daily.    [provider]  Cholecalciferol (VITAMIN D3) 2000 units TABS Take 2,000 Units by mouth daily.    [provider]  cyclobenzaprine (FLEXERIL) 10 MG tablet Take 1 tablet (10 mg total) by mouth 3 (three) times daily as needed for muscle spasms. 02/17/17   Newman Pies, MD  docusate sodium (COLACE) 100 MG capsule Take 1 capsule (100 mg total) by mouth 2 (two) times daily. 02/17/17   Newman Pies, MD  finasteride (PROPECIA) 1 MG tablet Take 0.5 mg by mouth every other day. Takes 0.5 tablet every other day    [provider]  oxyCODONE (OXY IR/ROXICODONE) 5 MG immediate release tablet Take 1-2 tablets (5-10 mg total) by mouth every 3 (three) hours as needed for breakthrough pain. 02/17/17   Newman Pies, MD  Probiotic Product (PROBIOTIC PO) Take 2 each by mouth daily.    [provider]  traMADol (ULTRAM) 50 MG tablet Take 50 mg by mouth 3 (three) times daily as needed for moderate pain.    [provider]  UNABLE TO FIND DISSOLVE 1/2 TROCHE BETWEEN UPPER GUM LINE AND CHEEK TWICE DAILY. DO NOT CHEW, SWALLOW OR PLACE UNDER TONGUE. 01/17/17   [provider]  valsartan (DIOVAN) 160 MG tablet Take 160 mg by mouth every evening.    [provider]    Family History History reviewed. No pertinent family history.  Social History Social History   Tobacco Use   Smoking status: Never   Smokeless tobacco: Never  Substance Use Topics   Alcohol use: No   Drug use: No      Allergies   No known allergies   Review of Systems Review of Systems  Constitutional:  Positive for fatigue and fever.  HENT:  Positive for congestion.   Respiratory:  Positive for cough and shortness of breath.   Musculoskeletal:  Positive for myalgias.  All other systems reviewed and are negative.   Physical Exam Triage Vital Signs ED Triage Vitals  Enc Vitals Group     BP 05/23/21 1408 (!) 159/93     Pulse Rate 05/23/21 1408 (!) 119     Resp 05/23/21 1408 20     Temp 05/23/21 1408 (!) 100.4 F (38 C)     Temp src --      SpO2 05/23/21 1408 92 %     Weight --      Height --      Head Circumference --      Peak Flow --      Pain Score 05/23/21 1405 7     Pain Loc --      Pain Edu? --      Excl. in [place]? --    No data found.  Updated Vital Signs BP (!) 159/93   Pulse (!) 119   Temp (!) 100.4 F (38 C)   Resp 20   SpO2 92%   Visual Acuity Right Eye Distance:   Left Eye Distance:   Bilateral Distance:    Right Eye Near:   Left Eye Near:    Bilateral Near:     Physical Exam Vitals and nursing note reviewed.  Constitutional:      General: She is not in acute distress.    Appearance: Normal appearance. She is not ill-appearing, toxic-appearing or diaphoretic.  HENT:     Head: Normocephalic and atraumatic.  Eyes:     Conjunctiva/sclera: Conjunctivae normal.  Cardiovascular:     Rate and Rhythm: Normal rate.     Pulses: Normal pulses.     Heart sounds: Normal heart sounds.  Pulmonary:     Effort: Pulmonary effort is normal.     Breath sounds: Rhonchi present.  Abdominal:     General: Abdomen is flat.  Musculoskeletal:        General: Normal range of motion.     Cervical back: Normal range of motion.  Skin:    General: Skin is warm and dry.  Neurological:     General: No focal deficit present.     Mental Status: She is alert and oriented to person, place, and time.  Psychiatric:        Mood and Affect: Mood normal.     UC Treatments /  Results  Labs (all labs ordered are listed, but only abnormal results are displayed) Labs Reviewed  POC INFLUENZA A AND B ANTIGEN (URGENT CARE ONLY) - Abnormal;  Notable for the following components:      Result Value   INFLUENZA A ANTIGEN, POC POSITIVE (*)    All other components within normal limits    EKG   Radiology DG Chest 2 View  Result Date: 05/23/2021 CLINICAL DATA:  Cough, congestion, fever EXAM: CHEST - 2 VIEW COMPARISON:  None. FINDINGS: The heart size and mediastinal contours are within normal limits. Both lungs are clear. The visualized skeletal structures are unremarkable. IMPRESSION: No active cardiopulmonary disease. Electronically Signed   By: Kathreen Devoid M.D.   On: 05/23/2021 15:04    Procedures Procedures (including critical care time)  Medications Ordered in UC Medications - No data to display  Initial Impression / Assessment and Plan / UC Course  I have reviewed the triage vital signs and the nursing notes.  Pertinent labs & imaging results that were available during my care of the patient were reviewed by me and considered in my medical decision making (see chart for details).    Assessment negative for red flags or concerns.  Patient is positive for influenza A.  X-ray with no acute cardiopulmonary disease.  Will prescribe Tamiflu twice daily for the next 5 days.  Tylenol and or ibuprofen as needed.  Encourage fluids and rest.  Discussed conservative symptom management as described in discharge instructions.  Follow-up with primary care for reevaluation as soon as possible.  Strict ED follow-up for any worsening symptoms. Final Clinical Impressions(s) / UC Diagnoses   Final diagnoses:  Influenza A     Discharge Instructions      Take the Tamiflu 1 pill twice a day for the next 5 days.  You may also use elderberry to help with symptoms.  You can take Tylenol and/or Ibuprofen as needed for fever reduction and pain relief.   For cough: honey 1/2 to  1 teaspoon (you can dilute the honey in water or another fluid).  You can also use guaifenesin and dextromethorphan for cough. You can use a humidifier for chest congestion and cough.  If you don't have a humidifier, you can sit in the bathroom with the hot shower running.     For sore throat: try warm salt water gargles, cepacol lozenges, throat spray, warm tea or water with lemon/honey, popsicles or ice, or OTC cold relief medicine for throat discomfort.    For congestion: take a daily anti-histamine like Zyrtec, Claritin, and a oral decongestant, such as pseudoephedrine.  You can also use Flonase 1-2 sprays in each nostril daily.    It is important to stay hydrated: drink plenty of fluids (water, gatorade/powerade/pedialyte, juices, or teas) to keep your throat moisturized and help further relieve irritation/discomfort.   Return or go to the Emergency Department if symptoms worsen or do not improve in the next few days.      ED Prescriptions     Medication Sig Dispense Auth. Provider   oseltamivir (TAMIFLU) 75 MG capsule Take 1 capsule (75 mg total) by mouth every 12 (twelve) hours. 10 capsule Pearson Forster, NP      PDMP not reviewed this encounter.   Pearson Forster, NP 05/23/21 [phone]

## 2021-05-23 NOTE — ED Triage Notes (Signed)
Pt reports for 2 weeks she has had a cough and sore throat. Pt now has muscle pain from coughing.

## 2021-05-24 DIAGNOSIS — E782 Mixed hyperlipidemia: Secondary | ICD-10-CM | POA: Diagnosis not present

## 2021-05-24 DIAGNOSIS — I10 Essential (primary) hypertension: Secondary | ICD-10-CM | POA: Diagnosis not present

## 2021-05-24 DIAGNOSIS — E559 Vitamin D deficiency, unspecified: Secondary | ICD-10-CM | POA: Diagnosis not present

## 2021-05-26 ENCOUNTER — Emergency Department (HOSPITAL_BASED_OUTPATIENT_CLINIC_OR_DEPARTMENT_OTHER): Payer: PPO

## 2021-05-26 ENCOUNTER — Inpatient Hospital Stay (HOSPITAL_BASED_OUTPATIENT_CLINIC_OR_DEPARTMENT_OTHER)
Admission: EM | Admit: 2021-05-26 | Discharge: 2021-05-28 | DRG: 193 | Disposition: A | Discharge status: Home / Self Care | Payer: PPO | Source: Ambulatory Visit | Attending: Internal Medicine | Admitting: Internal Medicine

## 2021-05-26 ENCOUNTER — Ambulatory Visit
Admission: EM | Admit: 2021-05-26 | Discharge: 2021-05-26 | Disposition: A | Discharge status: Home / Self Care | Payer: PPO

## 2021-05-26 ENCOUNTER — Other Ambulatory Visit: Payer: Self-pay

## 2021-05-26 ENCOUNTER — Encounter (HOSPITAL_BASED_OUTPATIENT_CLINIC_OR_DEPARTMENT_OTHER): Payer: Self-pay | Admitting: Emergency Medicine

## 2021-05-26 DIAGNOSIS — J9601 Acute respiratory failure with hypoxia: Secondary | ICD-10-CM | POA: Diagnosis not present

## 2021-05-26 DIAGNOSIS — I10 Essential (primary) hypertension: Secondary | ICD-10-CM | POA: Diagnosis not present

## 2021-05-26 DIAGNOSIS — J849 Interstitial pulmonary disease, unspecified: Secondary | ICD-10-CM | POA: Diagnosis not present

## 2021-05-26 DIAGNOSIS — J101 Influenza due to other identified influenza virus with other respiratory manifestations: Secondary | ICD-10-CM | POA: Diagnosis not present

## 2021-05-26 DIAGNOSIS — Z981 Arthrodesis status: Secondary | ICD-10-CM

## 2021-05-26 DIAGNOSIS — Z90710 Acquired absence of both cervix and uterus: Secondary | ICD-10-CM

## 2021-05-26 DIAGNOSIS — Z7982 Long term (current) use of aspirin: Secondary | ICD-10-CM | POA: Diagnosis not present

## 2021-05-26 DIAGNOSIS — J1000 Influenza due to other identified influenza virus with unspecified type of pneumonia: Secondary | ICD-10-CM | POA: Diagnosis not present

## 2021-05-26 DIAGNOSIS — R0902 Hypoxemia: Secondary | ICD-10-CM

## 2021-05-26 DIAGNOSIS — M1990 Unspecified osteoarthritis, unspecified site: Secondary | ICD-10-CM | POA: Diagnosis present

## 2021-05-26 DIAGNOSIS — R0602 Shortness of breath: Secondary | ICD-10-CM | POA: Diagnosis not present

## 2021-05-26 DIAGNOSIS — Z20822 Contact with and (suspected) exposure to covid-19: Secondary | ICD-10-CM | POA: Diagnosis present

## 2021-05-26 DIAGNOSIS — Z79899 Other long term (current) drug therapy: Secondary | ICD-10-CM

## 2021-05-26 DIAGNOSIS — I341 Nonrheumatic mitral (valve) prolapse: Secondary | ICD-10-CM | POA: Diagnosis not present

## 2021-05-26 DIAGNOSIS — R059 Cough, unspecified: Secondary | ICD-10-CM | POA: Diagnosis not present

## 2021-05-26 LAB — CBC WITH DIFFERENTIAL/PLATELET
Abs Immature Granulocytes: 0.15 10*3/uL — ABNORMAL HIGH (ref 0.00–0.07)
Basophils Absolute: 0 10*3/uL (ref 0.0–0.1)
Basophils Relative: 0 %
Eosinophils Absolute: 0 10*3/uL (ref 0.0–0.5)
Eosinophils Relative: 0 %
HCT: 41.5 % (ref 36.0–46.0)
Hemoglobin: 14 g/dL (ref 12.0–15.0)
Immature Granulocytes: 1 %
Lymphocytes Relative: 8 %
Lymphs Abs: 1 10*3/uL (ref 0.7–4.0)
MCH: 29.5 pg (ref 26.0–34.0)
MCHC: 33.7 g/dL (ref 30.0–36.0)
MCV: 87.6 fL (ref 80.0–100.0)
Monocytes Absolute: 1.3 10*3/uL — ABNORMAL HIGH (ref 0.1–1.0)
Monocytes Relative: 11 %
Neutro Abs: 9.3 10*3/uL — ABNORMAL HIGH (ref 1.7–7.7)
Neutrophils Relative %: 80 %
Platelets: 281 10*3/uL (ref 150–400)
RBC: 4.74 MIL/uL (ref 3.87–5.11)
RDW: 13.1 % (ref 11.5–15.5)
WBC: 11.8 10*3/uL — ABNORMAL HIGH (ref 4.0–10.5)
nRBC: 0 % (ref 0.0–0.2)

## 2021-05-26 LAB — COMPREHENSIVE METABOLIC PANEL
ALT: 27 U/L (ref 0–44)
AST: 32 U/L (ref 15–41)
Albumin: 3.3 g/dL — ABNORMAL LOW (ref 3.5–5.0)
Alkaline Phosphatase: 72 U/L (ref 38–126)
Anion gap: 12 (ref 5–15)
BUN: 16 mg/dL (ref 8–23)
CO2: 25 mmol/L (ref 22–32)
Calcium: 9.2 mg/dL (ref 8.9–10.3)
Chloride: 97 mmol/L — ABNORMAL LOW (ref 98–111)
Creatinine, Ser: 0.65 mg/dL (ref 0.44–1.00)
GFR, Estimated: >60 mL/min (ref >60)
Glucose, Bld: 102 mg/dL — ABNORMAL HIGH (ref 70–99)
Potassium: 3.5 mmol/L (ref 3.5–5.1)
Sodium: 134 mmol/L — ABNORMAL LOW (ref 135–145)
Total Bilirubin: 0.9 mg/dL (ref 0.3–1.2)
Total Protein: 7.8 g/dL (ref 6.5–8.1)

## 2021-05-26 LAB — URINALYSIS, ROUTINE W REFLEX MICROSCOPIC
Bilirubin Urine: NEGATIVE
Glucose, UA: NEGATIVE mg/dL
Ketones, ur: 5 mg/dL — AB
Leukocytes,Ua: NEGATIVE
Nitrite: NEGATIVE
Protein, ur: 100 mg/dL — AB
Specific Gravity, Urine: 1.015 (ref 1.005–1.030)
pH: 6.5 (ref 5.0–8.0)

## 2021-05-26 LAB — URINALYSIS, MICROSCOPIC (REFLEX)

## 2021-05-26 LAB — PROCALCITONIN: Procalcitonin: 0.32 ng/mL

## 2021-05-26 LAB — LACTIC ACID, PLASMA
Lactic Acid, Venous: 1.5 mmol/L (ref 0.5–1.9)
Lactic Acid, Venous: 1.1 mmol/L (ref 0.5–1.9)

## 2021-05-26 LAB — RESP PANEL BY RT-PCR (FLU A&B, COVID) ARPGX2
Influenza A by PCR: POSITIVE — AB
Influenza B by PCR: NEGATIVE
SARS Coronavirus 2 by RT PCR: NEGATIVE

## 2021-05-26 MED ORDER — GUAIFENESIN-DM 100-10 MG/5ML PO SYRP
5.0000 mL | ORAL_SOLUTION | ORAL | Status: DC | PRN
  Administered 2021-05-26: 5 mL via ORAL
  Filled 2021-05-26: qty 5

## 2021-05-26 MED ORDER — ACETAMINOPHEN 650 MG RE SUPP: 650.0000 mg | Freq: Four times a day (QID) | RECTAL | Status: DC | PRN

## 2021-05-26 MED ORDER — SENNOSIDES-DOCUSATE SODIUM 8.6-50 MG PO TABS: 1.0000 | ORAL_TABLET | Freq: Every evening | ORAL | Status: DC | PRN

## 2021-05-26 MED ORDER — LACTATED RINGERS IV SOLN: INTRAVENOUS | Status: AC

## 2021-05-26 MED ORDER — ACETAMINOPHEN 500 MG PO TABS
1000.0000 mg | ORAL_TABLET | Freq: Once | ORAL | Status: AC
  Administered 2021-05-26: 1000 mg via ORAL
  Filled 2021-05-26: qty 2

## 2021-05-26 MED ORDER — ENOXAPARIN SODIUM 40 MG/0.4ML IJ SOSY
40.0000 mg | PREFILLED_SYRINGE | INTRAMUSCULAR | Status: DC
  Administered 2021-05-27 – 2021-05-28 (×2): 40 mg via SUBCUTANEOUS
  Filled 2021-05-26 (×2): qty 0.4

## 2021-05-26 MED ORDER — LACTATED RINGERS IV SOLN: INTRAVENOUS | Status: DC

## 2021-05-26 MED ORDER — ACETAMINOPHEN 325 MG PO TABS: 650.0000 mg | ORAL_TABLET | Freq: Four times a day (QID) | ORAL | Status: DC | PRN

## 2021-05-26 MED ORDER — SODIUM CHLORIDE 0.9 % IV SOLN
500.0000 mg | INTRAVENOUS | Status: DC
  Administered 2021-05-26 – 2021-05-28 (×3): 500 mg via INTRAVENOUS
  Filled 2021-05-26 (×3): qty 500

## 2021-05-26 MED ORDER — ONDANSETRON HCL 4 MG/2ML IJ SOLN
4.0000 mg | Freq: Four times a day (QID) | INTRAMUSCULAR | Status: DC | PRN
  Administered 2021-05-28: 4 mg via INTRAVENOUS
  Filled 2021-05-26: qty 2

## 2021-05-26 MED ORDER — ALPRAZOLAM 0.5 MG PO TABS: 0.5000 mg | ORAL_TABLET | Freq: Every day | ORAL | Status: DC | PRN

## 2021-05-26 MED ORDER — ONDANSETRON HCL 4 MG PO TABS: 4.0000 mg | ORAL_TABLET | Freq: Four times a day (QID) | ORAL | Status: DC | PRN

## 2021-05-26 MED ORDER — LACTATED RINGERS IV BOLUS
500.0000 mL | Freq: Once | INTRAVENOUS | Status: AC
  Administered 2021-05-26: 500 mL via INTRAVENOUS

## 2021-05-26 MED ORDER — SODIUM CHLORIDE 0.9 % IV SOLN
2.0000 g | INTRAVENOUS | Status: DC
  Administered 2021-05-26 – 2021-05-28 (×3): 2 g via INTRAVENOUS
  Filled 2021-05-26 (×3): qty 20

## 2021-05-26 MED ORDER — TRAMADOL HCL 50 MG PO TABS
50.0000 mg | ORAL_TABLET | Freq: Two times a day (BID) | ORAL | Status: DC | PRN
  Administered 2021-05-27: 50 mg via ORAL
  Filled 2021-05-26: qty 1

## 2021-05-26 NOTE — ED Triage Notes (Signed)
Reports she was diagnosed with flu on Sunday.  Having continued SOB since.  Seen at UC this morning.  O2 sat was 86% on arrival here.  Noted to be SOB with little exertion.  Placed on 3 L in triage.  Currently 91-92%.

## 2021-05-26 NOTE — ED Notes (Signed)
[facility] left with pt 

## 2021-05-26 NOTE — ED Triage Notes (Signed)
Pt reports having a cough and SOB that has been going on for about two weeks. Husband states that patient was diagnosed with flu recently and was prescribed a mediation but she is not getting better.

## 2021-05-26 NOTE — ED Notes (Signed)
[facility] here for pt transfer

## 2021-05-26 NOTE — ED Notes (Signed)
Pt assisted to bedside commode

## 2021-05-26 NOTE — H&P (Signed)
History and Physical    Christine Hart ZDG:[id number] DOB: Jul 01, 1946 DOA: 05/26/2021  PCP: Venetia Maxon, Sharon Mt, MD   Patient coming from: home   Chief Complaint: SOB, cough, sore throat   HPI: Christine Hart is a pleasant 75 y.o. female with medical history significant for hypertension and arthritis, now presenting to the emergency department for evaluation of breath.  The patient reports that she developed sore throat and nonproductive cough approximately 2 weeks ago after exposure to her granddaughter that had RSV.  Patient continued to experience sore throat and nonproductive cough without any improvement, prompting her presentation to urgent care on 05/23/2021 where she was diagnosed with influenza A and started on oseltamavir.  Despite this, symptoms persisted and she also developed progressive exertional dyspnea.  She returned to urgent care today where she was noted to have oxygen saturation in the 80s on room air and was directed to the ED.  [facility] ED Course: Upon arrival to the ED, patient is found to have a temperature of 100.2 F, saturating 87% on room air, tachypneic, tachycardic, and with stable blood pressure.  EKG features sinus tachycardia with PVCs.  Chest x-ray with faint bilateral opacities.  Chemistry panel with sodium 134.  CBC notable for WBC 11,800.  Influenza A PCR is positive.  Blood cultures were collected, patient was given 500 cc of IV fluid, Rocephin, and azithromycin, and transported to [facility] for ongoing evaluation and management.  Review of Systems:  All other systems reviewed and apart from HPI, are negative.  Past Medical History:  Diagnosis Date   Arthritis    Complication of anesthesia    Limited mouth opening and neck mobility, s/p TMJ surgeries    Hypertension    MVP (mitral valve prolapse)    PONV (postoperative nausea and vomiting)     Past Surgical History:  Procedure Laterality Date   ABDOMINAL HYSTERECTOMY     APPENDECTOMY      CERVICAL SPINE SURGERY     FIRST RIB REMOVAL Right    HAND SURGERY Bilateral    NEUROMA SURGERY Left    POSTERIOR CERVICAL FUSION/FORAMINOTOMY N/A 02/16/2017   Procedure: CERVICAL 1- CERVICAL 2 POSTERIOR INSTRUMENTATION AND FUSION;  Surgeon: Newman Pies, MD;  Location: [facility];  Service: Neurosurgery;  Laterality: N/A;  CERVICAL 1- CERVICAL 2 POSTERIOR INSTRUMENTATION AND FUSION   TMJ ARTHROPLASTY      Social History:   reports that she has never smoked. She has never used smokeless tobacco. She reports that she does not drink alcohol and does not use drugs.  Allergies  Allergen Reactions   No Known Allergies     History reviewed. No pertinent family history.   Prior to Admission medications   Medication Sig Start Date End Date Taking? Authorizing Provider  amLODipine (NORVASC) 5 MG tablet Take 5 mg by mouth every evening.    [provider]  aspirin EC 81 MG tablet Take 81 mg by mouth daily.    [provider]  Cholecalciferol (VITAMIN D3) 2000 units TABS Take 2,000 Units by mouth daily.    [provider]  cyclobenzaprine (FLEXERIL) 10 MG tablet Take 1 tablet (10 mg total) by mouth 3 (three) times daily as needed for muscle spasms. 02/17/17   Newman Pies, MD  docusate sodium (COLACE) 100 MG capsule Take 1 capsule (100 mg total) by mouth 2 (two) times daily. 02/17/17   Newman Pies, MD  finasteride (PROPECIA) 1 MG tablet Take 0.5 mg by mouth every other day. Takes  0.5 tablet every other day    [provider]  oseltamivir (TAMIFLU) 75 MG capsule Take 1 capsule (75 mg total) by mouth every 12 (twelve) hours. 05/23/21   Pearson Forster, NP  oxyCODONE (OXY IR/ROXICODONE) 5 MG immediate release tablet Take 1-2 tablets (5-10 mg total) by mouth every 3 (three) hours as needed for breakthrough pain. 02/17/17   Newman Pies, MD  Probiotic Product (PROBIOTIC PO) Take 2 each by mouth daily.    [provider]  traMADol (ULTRAM) 50 MG  tablet Take 50 mg by mouth 3 (three) times daily as needed for moderate pain.    [provider]  UNABLE TO FIND DISSOLVE 1/2 TROCHE BETWEEN UPPER GUM LINE AND CHEEK TWICE DAILY. DO NOT CHEW, SWALLOW OR PLACE UNDER TONGUE. 01/17/17   [provider]  valsartan (DIOVAN) 160 MG tablet Take 160 mg by mouth every evening.    [provider]    Physical Exam: Vitals:   05/26/21 1900 05/26/21 2030 05/26/21 2100 05/26/21 2215  BP: (!) 155/80 (!) 153/73 (!) 142/70 (!) 156/74  Pulse: (!) 101 91 93   Resp: (!) 21 (!) 29 (!) 30 (!) 24  Temp:    98.6 F (37 C)  TempSrc:    Oral  SpO2: 93% 94% 97% 98%  Weight:    76.2 kg  Height:    5' 6.5" (1.689 m)    Constitutional: NAD, calm  Eyes: PERTLA, lids and conjunctivae normal ENMT: Mucous membranes are moist. Posterior pharynx clear of any exudate or lesions.   Neck: supple, no masses  Respiratory: Coarse rales bilaterally, no wheezing. No accessory muscle use.  Cardiovascular: S1 & S2 heard, regular rate and rhythm. No extremity edema.  Abdomen: No distension, no tenderness, soft. Bowel sounds active.  Musculoskeletal: no clubbing / cyanosis. No joint deformity upper and lower extremities.   Skin: no significant rashes, lesions, ulcers. Warm, dry, well-perfused. Neurologic: CN 2-12 grossly intact. Sensation intact. Moving all extremities. Alert and oriented.  Psychiatric: Pleasant. Cooperative.    Labs and Imaging on Admission: I have personally reviewed following labs and imaging studies  CBC: Recent Labs  Lab 05/26/21 1211  WBC 11.8*  NEUTROABS 9.3*  HGB 14.0  HCT 41.5  MCV 87.6  PLT 185   Basic Metabolic Panel: Recent Labs  Lab 05/26/21 1211  NA 134*  K 3.5  CL 97*  CO2 25  GLUCOSE 102*  BUN 16  CREATININE 0.65  CALCIUM 9.2   GFR: Estimated Creatinine Clearance: 66 mL/min (by C-G formula based on SCr of 0.65 mg/dL). Liver Function Tests: Recent Labs  Lab 05/26/21 1211  AST 32  ALT 27   ALKPHOS 72  BILITOT 0.9  PROT 7.8  ALBUMIN 3.3*   No results for input(s): LIPASE, AMYLASE in the last 168 hours. No results for input(s): AMMONIA in the last 168 hours. Coagulation Profile: No results for input(s): INR, PROTIME in the last 168 hours. Cardiac Enzymes: No results for input(s): CKTOTAL, CKMB, CKMBINDEX, TROPONINI in the last 168 hours. BNP (last 3 results) No results for input(s): PROBNP in the last 8760 hours. HbA1C: No results for input(s): HGBA1C in the last 72 hours. CBG: No results for input(s): GLUCAP in the last 168 hours. Lipid Profile: No results for input(s): CHOL, HDL, LDLCALC, TRIG, CHOLHDL, LDLDIRECT in the last 72 hours. Thyroid Function Tests: No results for input(s): TSH, T4TOTAL, FREET4, T3FREE, THYROIDAB in the last 72 hours. Anemia Panel: No results for input(s): VITAMINB12, FOLATE, FERRITIN,  TIBC, IRON, RETICCTPCT in the last 72 hours. Urine analysis:    Component Value Date/Time   COLORURINE YELLOW 05/26/2021 1236   APPEARANCEUR CLEAR 05/26/2021 1236   LABSPEC 1.015 05/26/2021 1236   PHURINE 6.5 05/26/2021 1236   GLUCOSEU NEGATIVE 05/26/2021 1236   HGBUR SMALL (A) 05/26/2021 1236   BILIRUBINUR NEGATIVE 05/26/2021 1236   KETONESUR 5 (A) 05/26/2021 1236   PROTEINUR 100 (A) 05/26/2021 1236   NITRITE NEGATIVE 05/26/2021 1236   LEUKOCYTESUR NEGATIVE 05/26/2021 1236   Sepsis Labs: @LABRCNTIP (procalcitonin:4,lacticidven:4) ) Recent Results (from the past 240 hour(s))  Resp Panel by RT-PCR (Flu A&B, Covid)     Status: Abnormal   Collection Time: 05/26/21 12:36 PM   Specimen: Nasopharyngeal(NP) swabs in vial transport medium  Result Value Ref Range Status   SARS Coronavirus 2 by RT PCR NEGATIVE NEGATIVE Final    Comment: (NOTE) SARS-CoV-2 target nucleic acids are NOT DETECTED.  The SARS-CoV-2 RNA is generally detectable in upper respiratory specimens during the acute phase of infection. The lowest concentration of SARS-CoV-2 viral  copies this assay can detect is 138 copies/mL. A negative result does not preclude SARS-Cov-2 infection and should not be used as the sole basis for treatment or other patient management decisions. A negative result may occur with  improper specimen collection/handling, submission of specimen other than nasopharyngeal swab, presence of viral mutation(s) within the areas targeted by this assay, and inadequate number of viral copies(<138 copies/mL). A negative result must be combined with clinical observations, patient history, and epidemiological information. The expected result is Negative.  Fact Sheet for Patients:  EntrepreneurPulse.com.au  Fact Sheet for Healthcare Providers:  IncredibleEmployment.be  This test is no t yet approved or cleared by the [place] FDA and  has been authorized for detection and/or diagnosis of SARS-CoV-2 by FDA under an Emergency Use Authorization (EUA). This EUA will remain  in effect (meaning this test can be used) for the duration of the COVID-19 declaration under Section 564(b)(1) of the Act, 21 U.S.C.section 360bbb-3(b)(1), unless the authorization is terminated  or revoked sooner.       Influenza A by PCR POSITIVE (A) NEGATIVE Final   Influenza B by PCR NEGATIVE NEGATIVE Final    Comment: (NOTE) The Xpert Xpress SARS-CoV-2/FLU/RSV plus assay is intended as an aid in the diagnosis of influenza from Nasopharyngeal swab specimens and should not be used as a sole basis for treatment. Nasal washings and aspirates are unacceptable for Xpert Xpress SARS-CoV-2/FLU/RSV testing.  Fact Sheet for Patients: EntrepreneurPulse.com.au  Fact Sheet for Healthcare Providers: IncredibleEmployment.be  This test is not yet approved or cleared by the [place] FDA and has been authorized for detection and/or diagnosis of SARS-CoV-2 by FDA under an Emergency Use Authorization (EUA).  This EUA will remain in effect (meaning this test can be used) for the duration of the COVID-19 declaration under Section 564(b)(1) of the Act, 21 U.S.C. section 360bbb-3(b)(1), unless the authorization is terminated or revoked.  Performed at [facility], [address] [place], [place] [address]      Radiological Exams on Admission: DG Chest [facility] 1 View  Result Date: 05/26/2021 CLINICAL DATA:  Cough, shortness of breath EXAM: PORTABLE CHEST 1 VIEW COMPARISON:  05/23/2021 FINDINGS: Unchanged cardiomediastinal silhouette. There are bilateral medial basilar opacities. No large pleural effusion or visible pneumothorax. There is no acute osseous abnormality. Partially visualized cervical spine fusion hardware. IMPRESSION: Faint bilateral medial basilar opacities which could be atelectasis or developing infection. Electronically Signed  By: Maurine Simmering M.D.   On: 05/26/2021 13:10    EKG: Independently reviewed. Sinus tachycardia, rate 120, PVCs, repolarization abnormality.   Assessment/Plan   1. Influenza A; acute hypoxic respiratory failure  - Presents with 2 weeks sore throat and non-productive cough, and now progressive SOB despite starting oseltamivir on 10/30  - CXR with subtle opacities b/l  - She was started on Rocephin and azithromycin in ED for possible bacterial PNA   - Complete Tamiflu course, check procalcitonin, continue Rocephin and azithromycin for now, continue supplemental O2 as needed    2. Hypertension  - Plan to continue home antihypertensives pending pharmacy medication reconciliation    DVT prophylaxis: Lovenox  Code Status: Full  Level of Care: Level of care: Telemetry Family Communication: None present  Disposition Plan:  Patient is from: Home  Anticipated d/c is to: Home  Anticipated d/c date is: 11/4 or 05/29/21 Patient currently: pending improvement in respiratory status  Consults called: none  Admission status: Inpatient     Vianne Bulls, MD [facility]  05/26/2021, 11:08 PM

## 2021-05-26 NOTE — Sepsis Progress Note (Signed)
eLink is following this Code Sepsis. °

## 2021-05-26 NOTE — ED Provider Notes (Signed)
Patient was evaluated by triage then by me.  Patient O2 sat was 88% on arrival, after the application of 4 L of oxygen via nasal cannula we were only able to get her saturation up to 91% after asking patient to take deliberate deep breaths for auscultation of her lungs.  I did not hear any wheeze, rale or rhonchi with auscultation however breath sounds were significantly decreased in all fields.  Patient is ill-appearing, has a pulse rate of 123 and a blood pressure of 163/94.  Per husband, patient is not eating well or drinking well, patient states nothing tastes good and she has no appetite.  Husband states patient has been coughing a lot, not sleeping well at night.  States patient was diagnosed with influenza A on October 30 at the [facility] urgent care.  He adds that he does not feel that she has improved but instead has actually gotten worse since then.  I offered patient emergency transport to the [facility] urgency room for more emergent evaluation and treatment, chest x-ray.  Patient politely declined.  Patient's husband agreed to take her now by private vehicle.  Oxygen was removed in the office, patient's oxygen saturation dropped again to 88%, per my observation patient continues to [facility] well despite toxic appearance.   Lynden Oxford Scales, PA-C 05/26/21 1121

## 2021-05-26 NOTE — ED Notes (Signed)
Report called to [place] at [facility]

## 2021-05-26 NOTE — ED Notes (Signed)
Patient is being discharged from the Urgent Care and sent to the Emergency Department via [facility] with husband (per request, refused EMS). Per L. Blanchie Serve, patient is in need of higher level of care due to Oxygen level (saturation). Patient is aware and verbalizes understanding of plan of care.  Vitals:   05/26/21 1105 05/26/21 1112  BP: (!) 166/110 (!) 163/94  Pulse: (!) 122 (!) 123  Resp: 20   Temp: 99.3 F (37.4 C)   SpO2: (!) 88% 91%

## 2021-05-26 NOTE — ED Provider Notes (Signed)
[place] EMERGENCY DEPARTMENT Provider Note   CSN: [id number] Arrival date & time: 05/26/21  1132     History Chief Complaint  Patient presents with   Shortness of Breath    Christine Hart is a 75 y.o. female.  This is a 75 year old female who was diagnosed with flu on 05/23/2021 who presents today from urgent care with increased difficulty breathing and hypoxia.  Patient has been taking antiviral for her influenza, but she and her husband state that her symptoms have been worsening over the last several days.  Patient states that she is unable to walk from room to room without needing up a break to catch her breath.  She has been having consistent fever with a high of 104 F for her husband.  She has been taking Tylenol for fever control, but is not always successful.  Today she went to the urgent care where her oxygen was found to be 88% on room air.  The provider gave her 4 L O2 nasal cannula which increased her oxygen saturation to 91%.  Saturations decreased anytime she was taken off nasal cannula.  Patient declined emergent transport and was instead brought by private vehicle by her husband, and her oxygen saturation was 80% 6% on room air upon arrival.  She is started on 3 L via nasal cannula and her saturations have been stable between 91% and 93%.  Patient denies sore throat, nasal congestion, runny nose, nausea vomiting, diarrhea, abdominal pain.  She does complain of chest pain but does believe it secondary to coughing.  She denies history of PE, CHF or any other chronic lung pathologies.  The history is provided by the patient and the spouse.  Shortness of Breath Severity:  Moderate Onset quality:  Gradual Duration:  4 days Timing:  Constant Progression:  Worsening Chronicity:  New Context: URI   Relieved by:  Lying down and oxygen Worsened by:  Coughing and exertion Associated symptoms: cough and fever   Associated symptoms: no abdominal pain, no chest pain, no  sore throat and no vomiting   Cough:    Cough characteristics:  Productive   Sputum characteristics:  Unable to specify   Severity:  Moderate   Onset quality:  Gradual   Duration:  4 days   Timing:  Constant   Progression:  Worsening   Chronicity:  New Risk factors: no hx of PE/DVT       Past Medical History:  Diagnosis Date   Arthritis    Complication of anesthesia    Limited mouth opening and neck mobility, s/p TMJ surgeries    Hypertension    MVP (mitral valve prolapse)    PONV (postoperative nausea and vomiting)     Patient Active Problem List   Diagnosis Date Noted   Influenza A 05/26/2021   Arthropathy of cervical facet joint 02/16/2017    Past Surgical History:  Procedure Laterality Date   ABDOMINAL HYSTERECTOMY     APPENDECTOMY     CERVICAL SPINE SURGERY     FIRST RIB REMOVAL Right    HAND SURGERY Bilateral    NEUROMA SURGERY Left    POSTERIOR CERVICAL FUSION/FORAMINOTOMY N/A 02/16/2017   Procedure: CERVICAL 1- CERVICAL 2 POSTERIOR INSTRUMENTATION AND FUSION;  Surgeon: Newman Pies, MD;  Location: [facility];  Service: Neurosurgery;  Laterality: N/A;  CERVICAL 1- CERVICAL 2 POSTERIOR INSTRUMENTATION AND FUSION   TMJ ARTHROPLASTY       OB History   No obstetric history on file.  No family history on file.  Social History   Tobacco Use   Smoking status: Never   Smokeless tobacco: Never  Substance Use Topics   Alcohol use: No   Drug use: No    Home Medications Prior to Admission medications   Medication Sig Start Date End Date Taking? Authorizing Provider  amLODipine (NORVASC) 5 MG tablet Take 5 mg by mouth every evening.    [provider]  aspirin EC 81 MG tablet Take 81 mg by mouth daily.    [provider]  Cholecalciferol (VITAMIN D3) 2000 units TABS Take 2,000 Units by mouth daily.    [provider]  cyclobenzaprine (FLEXERIL) 10 MG tablet Take 1 tablet (10 mg total) by mouth 3 (three) times daily as needed for  muscle spasms. 02/17/17   Newman Pies, MD  docusate sodium (COLACE) 100 MG capsule Take 1 capsule (100 mg total) by mouth 2 (two) times daily. 02/17/17   Newman Pies, MD  finasteride (PROPECIA) 1 MG tablet Take 0.5 mg by mouth every other day. Takes 0.5 tablet every other day    [provider]  oseltamivir (TAMIFLU) 75 MG capsule Take 1 capsule (75 mg total) by mouth every 12 (twelve) hours. 05/23/21   Pearson Forster, NP  oxyCODONE (OXY IR/ROXICODONE) 5 MG immediate release tablet Take 1-2 tablets (5-10 mg total) by mouth every 3 (three) hours as needed for breakthrough pain. 02/17/17   Newman Pies, MD  Probiotic Product (PROBIOTIC PO) Take 2 each by mouth daily.    [provider]  traMADol (ULTRAM) 50 MG tablet Take 50 mg by mouth 3 (three) times daily as needed for moderate pain.    [provider]  UNABLE TO FIND DISSOLVE 1/2 TROCHE BETWEEN UPPER GUM LINE AND CHEEK TWICE DAILY. DO NOT CHEW, SWALLOW OR PLACE UNDER TONGUE. 01/17/17   [provider]  valsartan (DIOVAN) 160 MG tablet Take 160 mg by mouth every evening.    [provider]    Allergies    No known allergies  Review of Systems   Review of Systems  Constitutional:  Positive for fever.  HENT:  Negative for ear discharge and sore throat.   Eyes:  Negative for discharge.  Respiratory:  Positive for cough, chest tightness and shortness of breath.   Cardiovascular:  Negative for chest pain.  Gastrointestinal:  Negative for abdominal pain, diarrhea and vomiting.  Genitourinary:  Negative for difficulty urinating.  Musculoskeletal: Negative.    Physical Exam Updated Vital Signs BP (!) 185/87   Pulse (!) 122   Temp 100.2 F (37.9 C) (Oral)   Resp (!) 25   Ht 5' 6.5" (1.689 m)   Wt 76.2 kg   SpO2 97%   BMI 26.71 kg/m   Physical Exam Vitals and nursing note reviewed.  Constitutional:      General: She is not in acute distress.    Appearance: She is normal weight.  She is ill-appearing.  HENT:     Head: Atraumatic.     Right Ear: Hearing and tympanic membrane normal.     Left Ear: Hearing and tympanic membrane normal.     Nose: Nose normal.     Mouth/Throat:     Lips: Pink.     Mouth: Mucous membranes are dry.     Pharynx: Oropharynx is clear.  Eyes:     Pupils: Pupils are equal, round, and reactive to light.  Cardiovascular:     Rate and Rhythm: Regular rhythm. Tachycardia  present.  Pulmonary:     Effort: Pulmonary effort is normal. Tachypnea present.     Breath sounds: Examination of the right-middle field reveals rhonchi. Examination of the left-middle field reveals rhonchi. Examination of the right-lower field reveals rhonchi. Examination of the left-lower field reveals rhonchi. Rhonchi present.  Chest:     Chest wall: No mass.  Abdominal:     General: Abdomen is flat.     Palpations: Abdomen is soft.     Tenderness: There is no abdominal tenderness.  Musculoskeletal:     Cervical back: Normal range of motion.     Right lower leg: No edema.     Left lower leg: No edema.  Skin:    General: Skin is warm and dry.     Capillary Refill: Capillary refill takes 2 to 3 seconds.  Neurological:     Mental Status: She is alert and oriented to person, place, and time.  Psychiatric:        Mood and Affect: Mood normal.    ED Results / Procedures / Treatments   Labs (all labs ordered are listed, but only abnormal results are displayed) Labs Reviewed  RESP PANEL BY RT-PCR (FLU A&B, COVID) ARPGX2 - Abnormal; Notable for the following components:      Result Value   Influenza A by PCR POSITIVE (*)    All other components within normal limits  COMPREHENSIVE METABOLIC PANEL - Abnormal; Notable for the following components:   Sodium 134 (*)    Chloride 97 (*)    Glucose, Bld 102 (*)    Albumin 3.3 (*)    All other components within normal limits  CBC WITH DIFFERENTIAL/PLATELET - Abnormal; Notable for the following components:   WBC 11.8 (*)     Neutro Abs 9.3 (*)    Monocytes Absolute 1.3 (*)    Abs Immature Granulocytes 0.15 (*)    All other components within normal limits  URINALYSIS, ROUTINE W REFLEX MICROSCOPIC - Abnormal; Notable for the following components:   Hgb urine dipstick SMALL (*)    Ketones, ur 5 (*)    Protein, ur 100 (*)    All other components within normal limits  URINALYSIS, MICROSCOPIC (REFLEX) - Abnormal; Notable for the following components:   Bacteria, UA FEW (*)    All other components within normal limits  CULTURE, BLOOD (ROUTINE X 2)  CULTURE, BLOOD (ROUTINE X 2)  LACTIC ACID, PLASMA  LACTIC ACID, PLASMA    EKG None  Radiology DG Chest Port 1 View  Result Date: 05/26/2021 CLINICAL DATA:  Cough, shortness of breath EXAM: PORTABLE CHEST 1 VIEW COMPARISON:  05/23/2021 FINDINGS: Unchanged cardiomediastinal silhouette. There are bilateral medial basilar opacities. No large pleural effusion or visible pneumothorax. There is no acute osseous abnormality. Partially visualized cervical spine fusion hardware. IMPRESSION: Faint bilateral medial basilar opacities which could be atelectasis or developing infection. Electronically Signed   By: Maurine Simmering M.D.   On: 05/26/2021 13:10    Procedures Procedures   Medications Ordered in ED Medications  lactated ringers infusion (has no administration in time range)  cefTRIAXone (ROCEPHIN) 2 g in sodium chloride 0.9 % 100 mL IVPB (2 g Intravenous New Bag/Given 05/26/21 1403)  azithromycin (ZITHROMAX) 500 mg in sodium chloride 0.9 % 250 mL IVPB (has no administration in time range)  lactated ringers bolus 500 mL (500 mLs Intravenous New Bag/Given 05/26/21 1243)  acetaminophen (TYLENOL) tablet 1,000 mg (1,000 mg Oral Given 05/26/21 1357)    ED Course  I  have reviewed the triage vital signs and the nursing notes.  Pertinent labs & imaginWith the only criteria being metg results that were available during my care of the patient were reviewed by me and considered  in my medical decision making (see chart for details).    MDM Rules/Calculators/A&P                         This is a 75 year old female previously diagnosed with influenza who presents today for hypoxia.  Patient was diagnosed with flu 4 days ago, with declining respiratory status since then.  Patient went to urgent care today with oxygen saturations of 88% on room air, which decreased down to 86% on room air upon arrival to the ED.  Patient did not meet SIRS criteria upon arrival with the only criteria met being heart rate of 126 bpm. However, shortly after arrival, her temperature spiked to 100.31F, with RR 25 bpm and HR 122 bpm, thus meeting SIRS criteria.  Code sepsis was initiated.  We administered 1 L lactated Ringer bolus to treat tachycardia and improve hydration status. CBC with white count of 11.8 and her chest x-ray revealed development of bilateral infiltrates indicative of early pneumonia.  Patient was given 1 g Tylenol for fever control.  Patient was started on IV ceftriaxone and Zithromax empirically in ED.  Benedetto Goad, PA-C spoke with Dr. Dorothy Puffer with the admission service who agrees that patient needs to be admitted for monitoring, treatment and oxygen therapy.  Patient resting comfortably in ED bed with stable oxygen at 96% while awaiting available inpatient bed. Final Clinical Impression(s) / ED Diagnoses Final diagnoses:  Hypoxic    Rx / DC Orders ED Discharge Orders     None        Rodena Piety 05/26/21 1927    Tegeler, Gwenyth Allegra, MD 05/27/21 [phone]

## 2021-05-26 NOTE — ED Notes (Signed)
Provider at bedside

## 2021-05-27 DIAGNOSIS — J101 Influenza due to other identified influenza virus with other respiratory manifestations: Secondary | ICD-10-CM

## 2021-05-27 LAB — BASIC METABOLIC PANEL
Anion gap: 6 (ref 5–15)
BUN: 9 mg/dL (ref 8–23)
CO2: 26 mmol/L (ref 22–32)
Calcium: 8.5 mg/dL — ABNORMAL LOW (ref 8.9–10.3)
Chloride: 104 mmol/L (ref 98–111)
Creatinine, Ser: 0.6 mg/dL (ref 0.44–1.00)
GFR, Estimated: >60 mL/min (ref >60)
Glucose, Bld: 91 mg/dL (ref 70–99)
Potassium: 3.2 mmol/L — ABNORMAL LOW (ref 3.5–5.1)
Sodium: 136 mmol/L (ref 135–145)

## 2021-05-27 LAB — CBC
HCT: 33.4 % — ABNORMAL LOW (ref 36.0–46.0)
Hemoglobin: 11.2 g/dL — ABNORMAL LOW (ref 12.0–15.0)
MCH: 29.7 pg (ref 26.0–34.0)
MCHC: 33.5 g/dL (ref 30.0–36.0)
MCV: 88.6 fL (ref 80.0–100.0)
Platelets: 222 10*3/uL (ref 150–400)
RBC: 3.77 MIL/uL — ABNORMAL LOW (ref 3.87–5.11)
RDW: 13.2 % (ref 11.5–15.5)
WBC: 9.9 10*3/uL (ref 4.0–10.5)
nRBC: 0 % (ref 0.0–0.2)

## 2021-05-27 LAB — PROCALCITONIN: Procalcitonin: 0.46 ng/mL

## 2021-05-27 LAB — STREP PNEUMONIAE URINARY ANTIGEN: Strep Pneumo Urinary Antigen: NEGATIVE

## 2021-05-27 LAB — MAGNESIUM: Magnesium: 2.3 mg/dL (ref 1.7–2.4)

## 2021-05-27 MED ORDER — IBUPROFEN 200 MG PO TABS: 400.0000 mg | ORAL_TABLET | Freq: Four times a day (QID) | ORAL | Status: DC | PRN

## 2021-05-27 MED ORDER — DM-GUAIFENESIN ER 30-600 MG PO TB12
1.0000 | ORAL_TABLET | Freq: Two times a day (BID) | ORAL | Status: DC
  Filled 2021-05-27 (×2): qty 1

## 2021-05-27 MED ORDER — GUAIFENESIN-DM 100-10 MG/5ML PO SYRP
15.0000 mL | ORAL_SOLUTION | ORAL | Status: DC | PRN
  Administered 2021-05-27: 15 mL via ORAL
  Filled 2021-05-27: qty 15

## 2021-05-27 MED ORDER — HYDROCOD POLST-CPM POLST ER 10-8 MG/5ML PO SUER
5.0000 mL | Freq: Two times a day (BID) | ORAL | Status: DC | PRN
  Administered 2021-05-27: 5 mL via ORAL
  Filled 2021-05-27: qty 5

## 2021-05-27 MED ORDER — OSELTAMIVIR PHOSPHATE 75 MG PO CAPS
75.0000 mg | ORAL_CAPSULE | Freq: Two times a day (BID) | ORAL | Status: AC
  Administered 2021-05-27 (×2): 75 mg via ORAL
  Filled 2021-05-27 (×2): qty 1

## 2021-05-27 MED ORDER — METOPROLOL SUCCINATE ER 50 MG PO TB24
50.0000 mg | ORAL_TABLET | Freq: Every day | ORAL | Status: DC
  Administered 2021-05-27 – 2021-05-28 (×2): 50 mg via ORAL
  Filled 2021-05-27 (×3): qty 1

## 2021-05-27 MED ORDER — CYCLOBENZAPRINE HCL 10 MG PO TABS
10.0000 mg | ORAL_TABLET | Freq: Three times a day (TID) | ORAL | Status: DC | PRN
  Administered 2021-05-28: 10 mg via ORAL
  Filled 2021-05-27: qty 1

## 2021-05-27 MED ORDER — POTASSIUM CHLORIDE 20 MEQ PO PACK
40.0000 meq | PACK | Freq: Once | ORAL | Status: AC
  Administered 2021-05-27: 40 meq via ORAL
  Filled 2021-05-27: qty 2

## 2021-05-27 MED ORDER — IRBESARTAN 75 MG PO TABS
75.0000 mg | ORAL_TABLET | Freq: Every day | ORAL | Status: DC
  Administered 2021-05-27 – 2021-05-28 (×2): 75 mg via ORAL
  Filled 2021-05-27 (×2): qty 1

## 2021-05-27 MED ORDER — TRAMADOL HCL 50 MG PO TABS
50.0000 mg | ORAL_TABLET | Freq: Three times a day (TID) | ORAL | Status: DC | PRN
  Administered 2021-05-27 – 2021-05-28 (×2): 50 mg via ORAL
  Filled 2021-05-27 (×2): qty 1

## 2021-05-27 NOTE — Plan of Care (Signed)

## 2021-05-27 NOTE — Progress Notes (Signed)
PROGRESS NOTE    Christine Hart   CVE:[id number]  DOB: 15-Mar-1946  PCP: Emmaline Kluver, MD    DOA: 05/26/2021 LOS: 1    Brief Narrative / Hospital Course to Date:   Per H&P: "75 y.o. female with medical history significant for hypertension and arthritis, now presenting to the emergency department for evaluation of breath.  The patient reports that she developed sore throat and nonproductive cough approximately 2 weeks ago after exposure to her granddaughter that had RSV.  Patient continued to experience sore throat and nonproductive cough without any improvement, prompting her presentation to urgent care on 05/23/2021 where she was diagnosed with influenza A and started on oseltamavir.  Despite this, symptoms persisted and she also developed progressive exertional dyspnea.  She returned to urgent care today where she was noted to have oxygen saturation in the 80s on room air and was directed to the ED."  Patient was found to have pneumonia and started on IV antibiotics.  Requiring 2 L/min supplemental oxygen.    Assessment & Plan   Principal Problem:   Influenza A Active Problems:   Hypertension   Acute respiratory failure with hypoxia (HCC)   Acute hypoxic respiratory failure - due to  Community-acquired Pneumonia - secondary to earlier  Influenza A --Continue  IV antibiotics --Continue Tamiflu to complete course --Supplement O2 for sats >90%, wean as tolerated --Antitussives PRN --IS and flutter for pulmonary hygiene --Follow blood and sputum cultures  Hypertension - continue home metoprolol and ARB  Patient BMI: Body mass index is 26.71 kg/m.   DVT prophylaxis: enoxaparin (LOVENOX) injection 40 mg Start: 05/27/21 1000   Diet:  Diet Orders (From admission, onward)     Start     Ordered   05/26/21 2242  Diet Heart Room service appropriate? Yes; Fluid consistency: Thin  Diet effective now       Question Answer Comment  Room service appropriate? Yes   Fluid  consistency: Thin      05/26/21 2244              Code Status: Full Code   Subjective 05/27/21    Pt seen with husband at beside.  She reports feeling slightly better today.  Still coughing a lot, productive at times but some difficulty producing phlegm.  Cough worse at night.     Disposition Plan & Communication   Status is: Inpatient  Remains inpatient appropriate because: severity of illness, still requiring oxygen, and on IV antibiotics   Family Communication: husband at bedside on rounds.   Consults, Procedures, Significant Events   Consultants:  none  Procedures:  none  Antimicrobials:  Anti-infectives (From admission, onward)    Start     Dose/Rate Route Frequency Ordered Stop   05/27/21 1000  oseltamivir (TAMIFLU) capsule 75 mg        75 mg Oral 2 times daily 05/27/21 0731 05/28/21 0959   05/26/21 1345  cefTRIAXone (ROCEPHIN) 2 g in sodium chloride 0.9 % 100 mL IVPB        2 g 200 mL/hr over 30 Minutes Intravenous Every 24 hours 05/26/21 1339 05/31/21 0959   05/26/21 1345  azithromycin (ZITHROMAX) 500 mg in sodium chloride 0.9 % 250 mL IVPB        500 mg 250 mL/hr over 60 Minutes Intravenous Every 24 hours 05/26/21 1339 05/31/21 0959         Micro    Objective   Vitals:   05/27/21 0120 05/27/21 0124 05/27/21 0546 05/27/21  1418  BP: 137/68  (!) 146/70 (!) 180/88  Pulse: 80 80 83 95  Resp: (!) 22   16  Temp: 97.7 F (36.5 C)  97.7 F (36.5 C) 97.7 F (36.5 C)  TempSrc: Oral  Oral Oral  SpO2: 98% 97% 99% 100%  Weight:      Height:        Intake/Output Summary (Last 24 hours) at 05/27/2021 1818 Last data filed at 05/27/2021 0825 Gross per 24 hour  Intake 2000.05 ml  Output 850 ml  Net 1150.05 ml   Filed Weights   05/26/21 1151 05/26/21 2215  Weight: 76.2 kg 76.2 kg    Physical Exam:  General exam: awake, alert, no acute distress HEENT: atraumatic, clear conjunctiva, anicteric sclera, moist mucus membranes, hearing grossly normal   Respiratory system: CTAB, no wheezes, rales or rhonchi, normal respiratory effort. On 2 L/min [place] O2. Cardiovascular system: normal S1/S2,  RRR, no JVD, murmurs, rubs, gallops,  no pedal edema.   Gastrointestinal system: soft, NT, ND, no HSM felt, +bowel sounds. Central nervous system: A&O x3. no gross focal neurologic deficits, normal speech Extremities: moves all , no edema, normal tone Psychiatry: normal mood, congruent affect, judgement and insight appear normal  Labs   Data Reviewed: I have personally reviewed following labs and imaging studies  CBC: Recent Labs  Lab 05/26/21 1211 05/27/21 0306  WBC 11.8* 9.9  NEUTROABS 9.3*  --   HGB 14.0 11.2*  HCT 41.5 33.4*  MCV 87.6 88.6  PLT 281 784   Basic Metabolic Panel: Recent Labs  Lab 05/26/21 1211 05/27/21 0306  NA 134* 136  K 3.5 3.2*  CL 97* 104  CO2 25 26  GLUCOSE 102* 91  BUN 16 9  CREATININE 0.65 0.60  CALCIUM 9.2 8.5*  MG  --  2.3   GFR: Estimated Creatinine Clearance: 66 mL/min (by C-G formula based on SCr of 0.6 mg/dL). Liver Function Tests: Recent Labs  Lab 05/26/21 1211  AST 32  ALT 27  ALKPHOS 72  BILITOT 0.9  PROT 7.8  ALBUMIN 3.3*   No results for input(s): LIPASE, AMYLASE in the last 168 hours. No results for input(s): AMMONIA in the last 168 hours. Coagulation Profile: No results for input(s): INR, PROTIME in the last 168 hours. Cardiac Enzymes: No results for input(s): CKTOTAL, CKMB, CKMBINDEX, TROPONINI in the last 168 hours. BNP (last 3 results) No results for input(s): PROBNP in the last 8760 hours. HbA1C: No results for input(s): HGBA1C in the last 72 hours. CBG: No results for input(s): GLUCAP in the last 168 hours. Lipid Profile: No results for input(s): CHOL, HDL, LDLCALC, TRIG, CHOLHDL, LDLDIRECT in the last 72 hours. Thyroid Function Tests: No results for input(s): TSH, T4TOTAL, FREET4, T3FREE, THYROIDAB in the last 72 hours. Anemia Panel: No results for input(s):  VITAMINB12, FOLATE, FERRITIN, TIBC, IRON, RETICCTPCT in the last 72 hours. Sepsis Labs: Recent Labs  Lab 05/26/21 1211 05/26/21 2241 05/26/21 2255 05/27/21 0306  PROCALCITON  --   --  0.32 0.46  LATICACIDVEN 1.5 1.1  --   --     Recent Results (from the past 240 hour(s))  Culture, blood (routine x 2)     Status: None (Preliminary result)   Collection Time: 05/26/21 12:11 PM   Specimen: BLOOD  Result Value Ref Range Status   Specimen Description   Final    BLOOD Blood Culture adequate volume Performed at [facility], [address], [place], [place] [address]  Special Requests   Final    BOTTLES DRAWN AEROBIC AND ANAEROBIC RIGHT ANTECUBITAL Performed at [facility], [address] [place], [place] [address]    Culture   Final    NO GROWTH < 24 HOURS Performed at [facility], [place] [address], [place], [place] [address]    Report Status PENDING  Incomplete  Culture, blood (routine x 2)     Status: None (Preliminary result)   Collection Time: 05/26/21 12:32 PM   Specimen: BLOOD  Result Value Ref Range Status   Specimen Description   Final    BLOOD Blood Culture adequate volume Performed at [facility], [address] [place], [place] [address]    Special Requests   Final    AEROBIC BOTTLE ONLY BLOOD LEFT HAND Performed at [facility], [address] [place], [place] [address]    Culture   Final    NO GROWTH < 24 HOURS Performed at [facility], [place] [address], [place], [place] [address]    Report Status PENDING  Incomplete  Resp Panel by RT-PCR (Flu A&B, Covid)     Status: Abnormal   Collection Time: 05/26/21 12:36 PM   Specimen: Nasopharyngeal(NP) swabs in vial transport medium  Result Value Ref Range Status   SARS Coronavirus 2 by RT PCR NEGATIVE NEGATIVE Final    Comment: (NOTE) SARS-CoV-2 target nucleic acids are NOT DETECTED.  The SARS-CoV-2 RNA is generally detectable in upper  respiratory specimens during the acute phase of infection. The lowest concentration of SARS-CoV-2 viral copies this assay can detect is 138 copies/mL. A negative result does not preclude SARS-Cov-2 infection and should not be used as the sole basis for treatment or other patient management decisions. A negative result may occur with  improper specimen collection/handling, submission of specimen other than nasopharyngeal swab, presence of viral mutation(s) within the areas targeted by this assay, and inadequate number of viral copies(<138 copies/mL). A negative result must be combined with clinical observations, patient history, and epidemiological information. The expected result is Negative.  Fact Sheet for Patients:  EntrepreneurPulse.com.au  Fact Sheet for Healthcare Providers:  IncredibleEmployment.be  This test is no t yet approved or cleared by the [place] FDA and  has been authorized for detection and/or diagnosis of SARS-CoV-2 by FDA under an Emergency Use Authorization (EUA). This EUA will remain  in effect (meaning this test can be used) for the duration of the COVID-19 declaration under Section 564(b)(1) of the Act, 21 U.S.C.section 360bbb-3(b)(1), unless the authorization is terminated  or revoked sooner.       Influenza A by PCR POSITIVE (A) NEGATIVE Final   Influenza B by PCR NEGATIVE NEGATIVE Final    Comment: (NOTE) The Xpert Xpress SARS-CoV-2/FLU/RSV plus assay is intended as an aid in the diagnosis of influenza from Nasopharyngeal swab specimens and should not be used as a sole basis for treatment. Nasal washings and aspirates are unacceptable for Xpert Xpress SARS-CoV-2/FLU/RSV testing.  Fact Sheet for Patients: EntrepreneurPulse.com.au  Fact Sheet for Healthcare Providers: IncredibleEmployment.be  This test is not yet approved or cleared by the [place] FDA and has been  authorized for detection and/or diagnosis of SARS-CoV-2 by FDA under an Emergency Use Authorization (EUA). This EUA will remain in effect (meaning this test can be used) for the duration of the COVID-19 declaration under Section 564(b)(1) of the Act, 21 U.S.C. section 360bbb-3(b)(1), unless the authorization is terminated or revoked.  Performed at [facility], [address] [place], [place] [address]       Imaging Studies   DG Chest [place] 1 View  Result Date: 05/26/2021 CLINICAL DATA:  Cough, shortness of breath EXAM: PORTABLE CHEST 1 VIEW COMPARISON:  05/23/2021 FINDINGS: Unchanged cardiomediastinal silhouette. There are bilateral medial basilar opacities. No large pleural effusion or visible pneumothorax. There is no acute osseous abnormality. Partially visualized cervical spine fusion hardware. IMPRESSION: Faint bilateral medial basilar opacities which could be atelectasis or developing infection. Electronically Signed   By: Maurine Simmering M.D.   On: 05/26/2021 13:10     Medications   Scheduled Meds:  enoxaparin (LOVENOX) injection  40 mg Subcutaneous Q24H   irbesartan  75 mg Oral Daily   metoprolol succinate  50 mg Oral Daily   oseltamivir  75 mg Oral BID   Continuous Infusions:  azithromycin 500 mg (05/27/21 1056)   cefTRIAXone (ROCEPHIN)  IV 2 g (05/27/21 1059)       LOS: 1 day    Time spent: 30 minutes    Ezekiel Slocumb, DO [facility]  05/27/2021, 6:18 PM      If 7PM-7AM, please contact night-coverage. How to contact the [facility] Attending or Consulting provider [place] or covering provider during after hours [place], for this patient?    Check the care team in [facility] and look for a) attending/consulting [facility] provider listed and b) the [facility] team listed Log into [email or web address] and use [place]'s universal password to access. If you do not have the password, please contact the hospital operator. Locate the [facility] provider you are looking for under  [facility] and page to a number that you can be directly reached. If you still have difficulty reaching the provider, please page the [facility] (Director on Call) for the Hospitalists listed on amion for assistance.

## 2021-05-28 LAB — BASIC METABOLIC PANEL
Anion gap: 6 (ref 5–15)
BUN: 7 mg/dL — ABNORMAL LOW (ref 8–23)
CO2: 26 mmol/L (ref 22–32)
Calcium: 8.4 mg/dL — ABNORMAL LOW (ref 8.9–10.3)
Chloride: 102 mmol/L (ref 98–111)
Creatinine, Ser: 0.52 mg/dL (ref 0.44–1.00)
GFR, Estimated: >60 mL/min (ref >60)
Glucose, Bld: 90 mg/dL (ref 70–99)
Potassium: 3.6 mmol/L (ref 3.5–5.1)
Sodium: 134 mmol/L — ABNORMAL LOW (ref 135–145)

## 2021-05-28 LAB — CBC
HCT: 32 % — ABNORMAL LOW (ref 36.0–46.0)
Hemoglobin: 10.8 g/dL — ABNORMAL LOW (ref 12.0–15.0)
MCH: 29.5 pg (ref 26.0–34.0)
MCHC: 33.8 g/dL (ref 30.0–36.0)
MCV: 87.4 fL (ref 80.0–100.0)
Platelets: 246 10*3/uL (ref 150–400)
RBC: 3.66 MIL/uL — ABNORMAL LOW (ref 3.87–5.11)
RDW: 13.2 % (ref 11.5–15.5)
WBC: 9.4 10*3/uL (ref 4.0–10.5)
nRBC: 0 % (ref 0.0–0.2)

## 2021-05-28 LAB — LEGIONELLA PNEUMOPHILA SEROGP 1 UR AG: L. pneumophila Serogp 1 Ur Ag: NEGATIVE

## 2021-05-28 LAB — MAGNESIUM: Magnesium: 2 mg/dL (ref 1.7–2.4)

## 2021-05-28 LAB — PROCALCITONIN: Procalcitonin: 0.29 ng/mL

## 2021-05-28 MED ORDER — CEFDINIR 300 MG PO CAPS: 300.0000 mg | ORAL_CAPSULE | Freq: Two times a day (BID) | ORAL | 0 refills | Status: AC

## 2021-05-28 MED ORDER — AZITHROMYCIN 500 MG PO TABS: 500.0000 mg | ORAL_TABLET | Freq: Every day | ORAL | 0 refills | Status: AC

## 2021-05-28 MED ORDER — HYDROCOD POLST-CPM POLST ER 10-8 MG/5ML PO SUER
5.0000 mL | Freq: Two times a day (BID) | ORAL | 0 refills | Status: DC | PRN
Start: 2021-05-28 — End: 2021-10-30

## 2021-05-28 NOTE — Discharge Summary (Signed)
Physician Discharge Summary  Christine Hart TKZ:[id number] DOB: 09-08-1945 DOA: 05/26/2021  PCP: Emmaline Kluver, MD  Admit date: 05/26/2021 Discharge date: 05/28/2021  Admitted From: Home Disposition: Home  Recommendations for Outpatient Follow-up:  Follow up with PCP in 1-2 weeks Please obtain BMP/CBC in one week Follow-up on resolution of patient's pneumonia likely secondary infection after viral illness  Home Health: No Equipment/Devices: None  Discharge Condition: Stable CODE STATUS: Full Diet recommendation: Heart Healthy     Discharge Diagnoses: Principal Problem:   Influenza A Active Problems:   Hypertension   Acute respiratory failure with hypoxia ([place])    Summary of HPI and Hospital Course:  Per H&P: "75 y.o. female with medical history significant for hypertension and arthritis, now presenting to the emergency department for evaluation of breath.  The patient reports that she developed sore throat and nonproductive cough approximately 2 weeks ago after exposure to her granddaughter that had RSV.  Patient continued to experience sore throat and nonproductive cough without any improvement, prompting her presentation to urgent care on 05/23/2021 where she was diagnosed with influenza A and started on oseltamavir.  Despite this, symptoms persisted and she also developed progressive exertional dyspnea.  She returned to urgent care today where she was noted to have oxygen saturation in the 80s on room air and was directed to the ED."   Patient was found to have pneumonia and started on IV antibiotics.  Requiring 2 L/min supplemental oxygen at time of admission.     Acute hypoxic respiratory failure - due to  Community-acquired Pneumonia - secondary to recent  Influenza A Treated with IV antibiotics and continued to Tamiflu to complete course. Patient was able to be weaned off oxygen with stable O2 sats. Supportive care with antitussives PRN, IS and flutter for  pulmonary hygiene. No growth to date on blood and sputum cultures.  Plan at time of discharge: 3 more days Zithromax and Omnicef for pneumonia. Tussionex prescription sent for cough at bedtime.  Patient was weaned off oxygen and maintaining sats in the mid to upper 90s on room air today.  Clinically improved and feeling better.  Stable for discharge home and primary care follow-up.   Hypertension - continue home metoprolol and ARB  Discharge Instructions   Discharge Instructions     Call MD for:   Complete by: As directed    Worsening shortness of breath, fever/chills   Call MD for:  extreme fatigue   Complete by: As directed    Call MD for:  persistant dizziness or light-headedness   Complete by: As directed    Call MD for:  severe uncontrolled pain   Complete by: As directed    Call MD for:  temperature >100.4   Complete by: As directed    Diet - low sodium heart healthy   Complete by: As directed    Discharge instructions   Complete by: As directed    Please take both antibiotics as prescribed for another 3 days for pneumonia.  You may use over-the-counter cough medicine, such as Robitussin DM. I also sent a prescription for Tussionex, cough medicine with pain medication which acts as a cough suppressant. Use Tussionex at night if coughing keeps you from sleeping.   Increase activity slowly   Complete by: As directed       Allergies as of 05/28/2021   No Known Allergies      Medication List     STOP taking these medications    oseltamivir 75 MG  capsule Commonly known as: TAMIFLU       TAKE these medications    acetaminophen 500 MG tablet Commonly known as: TYLENOL Take 500 mg by mouth every 6 (six) hours as needed for mild pain, fever or headache.   ALPRAZolam 0.5 MG tablet Commonly known as: XANAX Take 0.5 mg by mouth 3 (three) times daily as needed for anxiety.   azithromycin 500 MG tablet Commonly known as: Zithromax Take 1 tablet (500 mg  total) by mouth daily for 3 days. Take 1 tablet daily for 3 days.   cefdinir 300 MG capsule Commonly known as: OMNICEF Take 1 capsule (300 mg total) by mouth 2 (two) times daily for 3 days.   celecoxib 200 MG capsule Commonly known as: CELEBREX Take 200 mg by mouth 2 (two) times daily as needed for mild pain.   chlorpheniramine-HYDROcodone 10-8 MG/5ML Suer Commonly known as: TUSSIONEX Take 5 mLs by mouth every 12 (twelve) hours as needed for cough.   cyclobenzaprine 10 MG tablet Commonly known as: FLEXERIL Take 1 tablet (10 mg total) by mouth 3 (three) times daily as needed for muscle spasms.   ibuprofen 200 MG tablet Commonly known as: ADVIL Take 400 mg by mouth every 6 (six) hours as needed for headache, fever or mild pain.   metoprolol succinate 50 MG 24 hr tablet Commonly known as: TOPROL-XL Take 50 mg by mouth daily.   telmisartan 40 MG tablet Commonly known as: MICARDIS Take 40 mg by mouth daily.   traMADol 50 MG tablet Commonly known as: ULTRAM Take 50 mg by mouth 3 (three) times daily as needed for moderate pain.        No Known Allergies   If you experience worsening of your admission symptoms, develop shortness of breath, life threatening emergency, suicidal or homicidal thoughts you must seek medical attention immediately by calling 911 or calling your MD immediately  if symptoms less severe.    Please note   You were cared for by a hospitalist during your hospital stay. If you have any questions about your discharge medications or the care you received while you were in the hospital after you are discharged, you can call the unit and asked to speak with the hospitalist on call if the hospitalist that took care of you is not available. Once you are discharged, your primary care physician will handle any further medical issues. Please note that NO REFILLS for any discharge medications will be authorized once you are discharged, as it is imperative that you  return to your primary care physician (or establish a relationship with a primary care physician if you do not have one) for your aftercare needs so that they can reassess your need for medications and monitor your lab values.   Consultations: None    Procedures/Studies: DG Chest 2 View  Result Date: 05/23/2021 CLINICAL DATA:  Cough, congestion, fever EXAM: CHEST - 2 VIEW COMPARISON:  None. FINDINGS: The heart size and mediastinal contours are within normal limits. Both lungs are clear. The visualized skeletal structures are unremarkable. IMPRESSION: No active cardiopulmonary disease. Electronically Signed   By: Kathreen Devoid M.D.   On: 05/23/2021 15:04   DG Chest Port 1 View  Result Date: 05/26/2021 CLINICAL DATA:  Cough, shortness of breath EXAM: PORTABLE CHEST 1 VIEW COMPARISON:  05/23/2021 FINDINGS: Unchanged cardiomediastinal silhouette. There are bilateral medial basilar opacities. No large pleural effusion or visible pneumothorax. There is no acute osseous abnormality. Partially visualized cervical spine fusion hardware. IMPRESSION: Faint  bilateral medial basilar opacities which could be atelectasis or developing infection. Electronically Signed   By: Maurine Simmering M.D.   On: 05/26/2021 13:10        Subjective: Pt reports feeling better.  No SOB off oxygen. Ambulating well.  Eager to go home.  No fever/chills.   Discharge Exam: Vitals:   05/27/21 2347 05/28/21 0444  BP:  (!) 151/74  Pulse: 86 82  Resp: 18 17  Temp:  98.4 F (36.9 C)  SpO2: 95% 92%   Vitals:   05/27/21 2119 05/27/21 2346 05/27/21 2347 05/28/21 0444  BP: (!) 183/87 (!) 163/77  (!) 151/74  Pulse: 99 87 86 82  Resp: 20 18 18 17   Temp: 98.3 F (36.8 C)   98.4 F (36.9 C)  TempSrc: Oral   Oral  SpO2: 96% 98% 95% 92%  Weight:      Height:        General: Pt is alert, awake, not in acute distress Cardiovascular: RRR, S1/S2 +, no rubs, no gallops Respiratory: CTA bilaterally diminished bases, no wheezing,  no rhonchi Abdominal: Soft, NT, ND, bowel sounds + Extremities: no edema, no cyanosis    The results of significant diagnostics from this hospitalization (including imaging, microbiology, ancillary and laboratory) are listed below for reference.     Microbiology: Recent Results (from the past 240 hour(s))  Culture, blood (routine x 2)     Status: None (Preliminary result)   Collection Time: 05/26/21 12:11 PM   Specimen: BLOOD  Result Value Ref Range Status   Specimen Description   Final    BLOOD Blood Culture adequate volume Performed at [facility], [address] [place], [place] [address]    Special Requests   Final    BOTTLES DRAWN AEROBIC AND ANAEROBIC RIGHT ANTECUBITAL Performed at [facility], [address] [place], [place] [address]    Culture   Final    NO GROWTH 2 DAYS Performed at [facility], [place] [address], [place], [place] [address]    Report Status PENDING  Incomplete  Culture, blood (routine x 2)     Status: None (Preliminary result)   Collection Time: 05/26/21 12:32 PM   Specimen: BLOOD  Result Value Ref Range Status   Specimen Description   Final    BLOOD Blood Culture adequate volume Performed at [facility], [address] [place], [place] [address]    Special Requests   Final    AEROBIC BOTTLE ONLY BLOOD LEFT HAND Performed at [facility], [address] [place], [place] [address]    Culture   Final    NO GROWTH 2 DAYS Performed at [facility], [place] [address], [place], [place] [address]    Report Status PENDING  Incomplete  Resp Panel by RT-PCR (Flu A&B, Covid)     Status: Abnormal   Collection Time: 05/26/21 12:36 PM   Specimen: Nasopharyngeal(NP) swabs in vial transport medium  Result Value Ref Range Status   SARS Coronavirus 2 by RT PCR NEGATIVE NEGATIVE Final    Comment: (NOTE) SARS-CoV-2 target nucleic acids are NOT DETECTED.  The SARS-CoV-2 RNA is generally detectable in  upper respiratory specimens during the acute phase of infection. The lowest concentration of SARS-CoV-2 viral copies this assay can detect is 138 copies/mL. A negative result does not preclude SARS-Cov-2 infection and should not be used as the sole basis for treatment or other patient management  decisions. A negative result may occur with  improper specimen collection/handling, submission of specimen other than nasopharyngeal swab, presence of viral mutation(s) within the areas targeted by this assay, and inadequate number of viral copies(<138 copies/mL). A negative result must be combined with clinical observations, patient history, and epidemiological information. The expected result is Negative.  Fact Sheet for Patients:  EntrepreneurPulse.com.au  Fact Sheet for Healthcare Providers:  IncredibleEmployment.be  This test is no t yet approved or cleared by the [place] FDA and  has been authorized for detection and/or diagnosis of SARS-CoV-2 by FDA under an Emergency Use Authorization (EUA). This EUA will remain  in effect (meaning this test can be used) for the duration of the COVID-19 declaration under Section 564(b)(1) of the Act, 21 U.S.C.section 360bbb-3(b)(1), unless the authorization is terminated  or revoked sooner.       Influenza A by PCR POSITIVE (A) NEGATIVE Final   Influenza B by PCR NEGATIVE NEGATIVE Final    Comment: (NOTE) The Xpert Xpress SARS-CoV-2/FLU/RSV plus assay is intended as an aid in the diagnosis of influenza from Nasopharyngeal swab specimens and should not be used as a sole basis for treatment. Nasal washings and aspirates are unacceptable for Xpert Xpress SARS-CoV-2/FLU/RSV testing.  Fact Sheet for Patients: EntrepreneurPulse.com.au  Fact Sheet for Healthcare Providers: IncredibleEmployment.be  This test is not yet approved or cleared by the [place] FDA and has  been authorized for detection and/or diagnosis of SARS-CoV-2 by FDA under an Emergency Use Authorization (EUA). This EUA will remain in effect (meaning this test can be used) for the duration of the COVID-19 declaration under Section 564(b)(1) of the Act, 21 U.S.C. section 360bbb-3(b)(1), unless the authorization is terminated or revoked.  Performed at [facility], [address] [place], [place] [address]      Labs: BNP (last 3 results) No results for input(s): BNP in the last 8760 hours. Basic Metabolic Panel: Recent Labs  Lab 05/26/21 1211 05/27/21 0306 05/28/21 0325  NA 134* 136 134*  K 3.5 3.2* 3.6  CL 97* 104 102  CO2 25 26 26   GLUCOSE 102* 91 90  BUN 16 9 7*  CREATININE 0.65 0.60 0.52  CALCIUM 9.2 8.5* 8.4*  MG  --  2.3 2.0   Liver Function Tests: Recent Labs  Lab 05/26/21 1211  AST 32  ALT 27  ALKPHOS 72  BILITOT 0.9  PROT 7.8  ALBUMIN 3.3*   No results for input(s): LIPASE, AMYLASE in the last 168 hours. No results for input(s): AMMONIA in the last 168 hours. CBC: Recent Labs  Lab 05/26/21 1211 05/27/21 0306 05/28/21 0325  WBC 11.8* 9.9 9.4  NEUTROABS 9.3*  --   --   HGB 14.0 11.2* 10.8*  HCT 41.5 33.4* 32.0*  MCV 87.6 88.6 87.4  PLT 281 222 246   Cardiac Enzymes: No results for input(s): CKTOTAL, CKMB, CKMBINDEX, TROPONINI in the last 168 hours. BNP: Invalid input(s): POCBNP CBG: No results for input(s): GLUCAP in the last 168 hours. D-Dimer No results for input(s): DDIMER in the last 72 hours. Hgb A1c No results for input(s): HGBA1C in the last 72 hours. Lipid Profile No results for input(s): CHOL, HDL, LDLCALC, TRIG, CHOLHDL, LDLDIRECT in the last 72 hours. Thyroid function studies No results for input(s): TSH, T4TOTAL, T3FREE, THYROIDAB in the last 72 hours.  Invalid input(s): FREET3 Anemia work up No results for input(s): VITAMINB12, FOLATE, FERRITIN, TIBC, IRON, RETICCTPCT in the last 72 hours. Urinalysis  Component Value Date/Time   COLORURINE YELLOW 05/26/2021 1236   APPEARANCEUR CLEAR 05/26/2021 1236   LABSPEC 1.015 05/26/2021 1236   PHURINE 6.5 05/26/2021 1236   GLUCOSEU NEGATIVE 05/26/2021 1236   HGBUR SMALL (A) 05/26/2021 1236   BILIRUBINUR NEGATIVE 05/26/2021 1236   KETONESUR 5 (A) 05/26/2021 1236   PROTEINUR 100 (A) 05/26/2021 1236   NITRITE NEGATIVE 05/26/2021 1236   LEUKOCYTESUR NEGATIVE 05/26/2021 1236   Sepsis Labs Invalid input(s): PROCALCITONIN,  WBC,  LACTICIDVEN Microbiology Recent Results (from the past 240 hour(s))  Culture, blood (routine x 2)     Status: None (Preliminary result)   Collection Time: 05/26/21 12:11 PM   Specimen: BLOOD  Result Value Ref Range Status   Specimen Description   Final    BLOOD Blood Culture adequate volume Performed at [facility], [address] [place], [place] [address]    Special Requests   Final    BOTTLES DRAWN AEROBIC AND ANAEROBIC RIGHT ANTECUBITAL Performed at [facility], [address] [place], [place] [address]    Culture   Final    NO GROWTH 2 DAYS Performed at [facility], [place] [address], [place], [place] [address]    Report Status PENDING  Incomplete  Culture, blood (routine x 2)     Status: None (Preliminary result)   Collection Time: 05/26/21 12:32 PM   Specimen: BLOOD  Result Value Ref Range Status   Specimen Description   Final    BLOOD Blood Culture adequate volume Performed at [facility], [address] [place], [place] [address]    Special Requests   Final    AEROBIC BOTTLE ONLY BLOOD LEFT HAND Performed at [facility], [address] [place], [place] [address]    Culture   Final    NO GROWTH 2 DAYS Performed at [facility], [place] [address] [place], [place] [address]    Report Status PENDING  Incomplete  Resp Panel by RT-PCR (Flu A&B, Covid)     Status: Abnormal   Collection Time: 05/26/21 12:36 PM   Specimen: Nasopharyngeal(NP)  swabs in vial transport medium  Result Value Ref Range Status   SARS Coronavirus 2 by RT PCR NEGATIVE NEGATIVE Final    Comment: (NOTE) SARS-CoV-2 target nucleic acids are NOT DETECTED.  The SARS-CoV-2 RNA is generally detectable in upper respiratory specimens during the acute phase of infection. The lowest concentration of SARS-CoV-2 viral copies this assay can detect is 138 copies/mL. A negative result does not preclude SARS-Cov-2 infection and should not be used as the sole basis for treatment or other patient management decisions. A negative result may occur with  improper specimen collection/handling, submission of specimen other than nasopharyngeal swab, presence of viral mutation(s) within the areas targeted by this assay, and inadequate number of viral copies(<138 copies/mL). A negative result must be combined with clinical observations, patient history, and epidemiological information. The expected result is Negative.  Fact Sheet for Patients:  EntrepreneurPulse.com.au  Fact Sheet for Healthcare Providers:  IncredibleEmployment.be  This test is no t yet approved or cleared by the [place] FDA and  has been authorized for detection and/or diagnosis of SARS-CoV-2 by FDA under an Emergency Use Authorization (EUA). This EUA will remain  in effect (meaning this test can be used) for the duration of the COVID-19 declaration under Section 564(b)(1) of the Act, 21 U.S.C.section 360bbb-3(b)(1), unless the authorization is terminated  or revoked sooner.  Influenza A by PCR POSITIVE (A) NEGATIVE Final   Influenza B by PCR NEGATIVE NEGATIVE Final    Comment: (NOTE) The Xpert Xpress SARS-CoV-2/FLU/RSV plus assay is intended as an aid in the diagnosis of influenza from Nasopharyngeal swab specimens and should not be used as a sole basis for treatment. Nasal washings and aspirates are unacceptable for Xpert Xpress  SARS-CoV-2/FLU/RSV testing.  Fact Sheet for Patients: EntrepreneurPulse.com.au  Fact Sheet for Healthcare Providers: IncredibleEmployment.be  This test is not yet approved or cleared by the [place] FDA and has been authorized for detection and/or diagnosis of SARS-CoV-2 by FDA under an Emergency Use Authorization (EUA). This EUA will remain in effect (meaning this test can be used) for the duration of the COVID-19 declaration under Section 564(b)(1) of the Act, 21 U.S.C. section 360bbb-3(b)(1), unless the authorization is terminated or revoked.  Performed at [facility], [address] [place], [place] [address]      Time coordinating discharge: Over 30 minutes  SIGNED:   Ezekiel Slocumb, DO [facility] 05/28/2021, 2:48 PM   If 7PM-7AM, please contact night-coverage [email or web address]

## 2021-05-31 LAB — CULTURE, BLOOD (ROUTINE X 2)
Culture: NO GROWTH
Culture: NO GROWTH
Specimen Description: ADEQUATE
Specimen Description: ADEQUATE

## 2021-06-11 DIAGNOSIS — E663 Overweight: Secondary | ICD-10-CM | POA: Diagnosis not present

## 2021-06-11 DIAGNOSIS — J09X1 Influenza due to identified novel influenza A virus with pneumonia: Secondary | ICD-10-CM | POA: Diagnosis not present

## 2021-06-11 DIAGNOSIS — I10 Essential (primary) hypertension: Secondary | ICD-10-CM | POA: Diagnosis not present

## 2021-06-11 DIAGNOSIS — Z6827 Body mass index (BMI) 27.0-27.9, adult: Secondary | ICD-10-CM | POA: Diagnosis not present

## 2021-06-11 DIAGNOSIS — I471 Supraventricular tachycardia: Secondary | ICD-10-CM | POA: Diagnosis not present

## 2021-06-11 DIAGNOSIS — Z23 Encounter for immunization: Secondary | ICD-10-CM | POA: Diagnosis not present

## 2021-06-11 DIAGNOSIS — K582 Mixed irritable bowel syndrome: Secondary | ICD-10-CM | POA: Diagnosis not present

## 2021-07-07 DIAGNOSIS — D485 Neoplasm of uncertain behavior of skin: Secondary | ICD-10-CM | POA: Diagnosis not present

## 2021-07-25 DIAGNOSIS — J189 Pneumonia, unspecified organism: Secondary | ICD-10-CM

## 2021-07-25 HISTORY — DX: Pneumonia, unspecified organism: J18.9

## 2021-07-25 HISTORY — PX: SQUAMOUS CELL CARCINOMA EXCISION: SHX2433

## 2021-08-04 DIAGNOSIS — T466X5A Adverse effect of antihyperlipidemic and antiarteriosclerotic drugs, initial encounter: Secondary | ICD-10-CM | POA: Diagnosis not present

## 2021-08-04 DIAGNOSIS — M810 Age-related osteoporosis without current pathological fracture: Secondary | ICD-10-CM | POA: Diagnosis not present

## 2021-08-04 DIAGNOSIS — Z79899 Other long term (current) drug therapy: Secondary | ICD-10-CM | POA: Diagnosis not present

## 2021-08-04 DIAGNOSIS — Z0000 Encounter for general adult medical examination without abnormal findings: Secondary | ICD-10-CM | POA: Diagnosis not present

## 2021-08-04 DIAGNOSIS — H93A3 Pulsatile tinnitus, bilateral: Secondary | ICD-10-CM | POA: Diagnosis not present

## 2021-08-04 DIAGNOSIS — I10 Essential (primary) hypertension: Secondary | ICD-10-CM | POA: Diagnosis not present

## 2021-08-04 DIAGNOSIS — E559 Vitamin D deficiency, unspecified: Secondary | ICD-10-CM | POA: Diagnosis not present

## 2021-08-04 DIAGNOSIS — R7302 Impaired glucose tolerance (oral): Secondary | ICD-10-CM | POA: Diagnosis not present

## 2021-08-04 DIAGNOSIS — R0989 Other specified symptoms and signs involving the circulatory and respiratory systems: Secondary | ICD-10-CM | POA: Diagnosis not present

## 2021-08-04 DIAGNOSIS — M542 Cervicalgia: Secondary | ICD-10-CM | POA: Diagnosis not present

## 2021-08-04 DIAGNOSIS — G720 Drug-induced myopathy: Secondary | ICD-10-CM | POA: Diagnosis not present

## 2021-08-04 DIAGNOSIS — E782 Mixed hyperlipidemia: Secondary | ICD-10-CM | POA: Diagnosis not present

## 2021-08-06 ENCOUNTER — Other Ambulatory Visit: Payer: Self-pay | Admitting: Family Medicine

## 2021-08-06 DIAGNOSIS — R0989 Other specified symptoms and signs involving the circulatory and respiratory systems: Secondary | ICD-10-CM

## 2021-08-06 DIAGNOSIS — H93A3 Pulsatile tinnitus, bilateral: Secondary | ICD-10-CM

## 2021-08-07 ENCOUNTER — Other Ambulatory Visit: Payer: Self-pay

## 2021-08-07 ENCOUNTER — Ambulatory Visit (HOSPITAL_BASED_OUTPATIENT_CLINIC_OR_DEPARTMENT_OTHER)
Admission: RE | Admit: 2021-08-07 | Discharge: 2021-08-07 | Disposition: A | Discharge status: Home / Self Care | Payer: PPO | Source: Ambulatory Visit | Attending: Family Medicine | Admitting: Family Medicine

## 2021-08-07 DIAGNOSIS — H93A3 Pulsatile tinnitus, bilateral: Secondary | ICD-10-CM | POA: Insufficient documentation

## 2021-08-07 DIAGNOSIS — I720 Aneurysm of carotid artery: Secondary | ICD-10-CM | POA: Diagnosis not present

## 2021-08-07 DIAGNOSIS — R0989 Other specified symptoms and signs involving the circulatory and respiratory systems: Secondary | ICD-10-CM | POA: Diagnosis not present

## 2021-08-07 LAB — POCT I-STAT CREATININE: Creatinine, Ser: 0.7 mg/dL (ref 0.44–1.00)

## 2021-08-07 MED ORDER — IOHEXOL 350 MG/ML SOLN
100.0000 mL | Freq: Once | INTRAVENOUS | Status: AC | PRN
  Administered 2021-08-07: 75 mL via INTRAVENOUS

## 2021-08-26 DIAGNOSIS — L814 Other melanin hyperpigmentation: Secondary | ICD-10-CM | POA: Diagnosis not present

## 2021-08-26 DIAGNOSIS — L853 Xerosis cutis: Secondary | ICD-10-CM | POA: Diagnosis not present

## 2021-08-26 DIAGNOSIS — R233 Spontaneous ecchymoses: Secondary | ICD-10-CM | POA: Diagnosis not present

## 2021-09-07 DIAGNOSIS — C44722 Squamous cell carcinoma of skin of right lower limb, including hip: Secondary | ICD-10-CM | POA: Diagnosis not present

## 2021-09-16 DIAGNOSIS — L259 Unspecified contact dermatitis, unspecified cause: Secondary | ICD-10-CM | POA: Diagnosis not present

## 2021-09-16 DIAGNOSIS — J329 Chronic sinusitis, unspecified: Secondary | ICD-10-CM | POA: Diagnosis not present

## 2021-09-16 DIAGNOSIS — J40 Bronchitis, not specified as acute or chronic: Secondary | ICD-10-CM | POA: Diagnosis not present

## 2021-09-16 DIAGNOSIS — Z20828 Contact with and (suspected) exposure to other viral communicable diseases: Secondary | ICD-10-CM | POA: Diagnosis not present

## 2021-09-21 DIAGNOSIS — H2511 Age-related nuclear cataract, right eye: Secondary | ICD-10-CM | POA: Diagnosis not present

## 2021-09-21 DIAGNOSIS — H25812 Combined forms of age-related cataract, left eye: Secondary | ICD-10-CM | POA: Diagnosis not present

## 2021-09-21 DIAGNOSIS — Z01818 Encounter for other preprocedural examination: Secondary | ICD-10-CM | POA: Diagnosis not present

## 2021-09-27 DIAGNOSIS — I8310 Varicose veins of unspecified lower extremity with inflammation: Secondary | ICD-10-CM | POA: Diagnosis not present

## 2021-09-27 DIAGNOSIS — L300 Nummular dermatitis: Secondary | ICD-10-CM | POA: Diagnosis not present

## 2021-09-28 DIAGNOSIS — M1711 Unilateral primary osteoarthritis, right knee: Secondary | ICD-10-CM | POA: Diagnosis not present

## 2021-09-28 DIAGNOSIS — M2351 Chronic instability of knee, right knee: Secondary | ICD-10-CM | POA: Diagnosis not present

## 2021-09-29 ENCOUNTER — Other Ambulatory Visit: Payer: Self-pay | Admitting: Orthopedic Surgery

## 2021-09-29 DIAGNOSIS — M1711 Unilateral primary osteoarthritis, right knee: Secondary | ICD-10-CM

## 2021-09-29 DIAGNOSIS — M2651 Abnormal jaw closure: Secondary | ICD-10-CM

## 2021-10-06 DIAGNOSIS — H25812 Combined forms of age-related cataract, left eye: Secondary | ICD-10-CM | POA: Diagnosis not present

## 2021-10-14 ENCOUNTER — Ambulatory Visit
Admission: RE | Admit: 2021-10-14 | Discharge: 2021-10-14 | Disposition: A | Discharge status: Home / Self Care | Payer: PPO | Source: Ambulatory Visit | Attending: Orthopedic Surgery | Admitting: Orthopedic Surgery

## 2021-10-14 ENCOUNTER — Other Ambulatory Visit: Payer: Self-pay

## 2021-10-14 DIAGNOSIS — M1711 Unilateral primary osteoarthritis, right knee: Secondary | ICD-10-CM

## 2021-10-14 DIAGNOSIS — M25561 Pain in right knee: Secondary | ICD-10-CM | POA: Diagnosis not present

## 2021-10-14 DIAGNOSIS — M2651 Abnormal jaw closure: Secondary | ICD-10-CM

## 2021-10-19 DIAGNOSIS — M2351 Chronic instability of knee, right knee: Secondary | ICD-10-CM | POA: Diagnosis not present

## 2021-10-19 DIAGNOSIS — S83231A Complex tear of medial meniscus, current injury, right knee, initial encounter: Secondary | ICD-10-CM | POA: Diagnosis not present

## 2021-10-19 DIAGNOSIS — Z9889 Other specified postprocedural states: Secondary | ICD-10-CM | POA: Diagnosis not present

## 2021-10-19 DIAGNOSIS — M1711 Unilateral primary osteoarthritis, right knee: Secondary | ICD-10-CM | POA: Diagnosis not present

## 2021-10-20 DIAGNOSIS — R051 Acute cough: Secondary | ICD-10-CM | POA: Diagnosis not present

## 2021-10-20 DIAGNOSIS — J019 Acute sinusitis, unspecified: Secondary | ICD-10-CM | POA: Diagnosis not present

## 2021-10-20 DIAGNOSIS — I10 Essential (primary) hypertension: Secondary | ICD-10-CM | POA: Diagnosis not present

## 2021-10-20 DIAGNOSIS — Z20828 Contact with and (suspected) exposure to other viral communicable diseases: Secondary | ICD-10-CM | POA: Diagnosis not present

## 2021-10-20 DIAGNOSIS — R6889 Other general symptoms and signs: Secondary | ICD-10-CM | POA: Diagnosis not present

## 2021-10-24 ENCOUNTER — Emergency Department (HOSPITAL_BASED_OUTPATIENT_CLINIC_OR_DEPARTMENT_OTHER): Payer: PPO

## 2021-10-24 ENCOUNTER — Emergency Department (HOSPITAL_BASED_OUTPATIENT_CLINIC_OR_DEPARTMENT_OTHER)
Admission: EM | Admit: 2021-10-24 | Discharge: 2021-10-24 | Disposition: A | Discharge status: Home / Self Care | Payer: PPO | Attending: Emergency Medicine | Admitting: Emergency Medicine

## 2021-10-24 ENCOUNTER — Encounter (HOSPITAL_BASED_OUTPATIENT_CLINIC_OR_DEPARTMENT_OTHER): Payer: Self-pay | Admitting: Emergency Medicine

## 2021-10-24 ENCOUNTER — Other Ambulatory Visit: Payer: Self-pay

## 2021-10-24 DIAGNOSIS — Z79899 Other long term (current) drug therapy: Secondary | ICD-10-CM | POA: Insufficient documentation

## 2021-10-24 DIAGNOSIS — B9789 Other viral agents as the cause of diseases classified elsewhere: Secondary | ICD-10-CM | POA: Diagnosis not present

## 2021-10-24 DIAGNOSIS — Z20822 Contact with and (suspected) exposure to covid-19: Secondary | ICD-10-CM | POA: Insufficient documentation

## 2021-10-24 DIAGNOSIS — H1031 Unspecified acute conjunctivitis, right eye: Secondary | ICD-10-CM | POA: Diagnosis not present

## 2021-10-24 DIAGNOSIS — H109 Unspecified conjunctivitis: Secondary | ICD-10-CM

## 2021-10-24 DIAGNOSIS — J069 Acute upper respiratory infection, unspecified: Secondary | ICD-10-CM

## 2021-10-24 DIAGNOSIS — I517 Cardiomegaly: Secondary | ICD-10-CM | POA: Diagnosis not present

## 2021-10-24 DIAGNOSIS — R509 Fever, unspecified: Secondary | ICD-10-CM | POA: Diagnosis not present

## 2021-10-24 DIAGNOSIS — R059 Cough, unspecified: Secondary | ICD-10-CM | POA: Diagnosis not present

## 2021-10-24 LAB — RESP PANEL BY RT-PCR (FLU A&B, COVID) ARPGX2
Influenza A by PCR: NEGATIVE
Influenza B by PCR: NEGATIVE
SARS Coronavirus 2 by RT PCR: NEGATIVE

## 2021-10-24 MED ORDER — ERYTHROMYCIN 5 MG/GM OP OINT: TOPICAL_OINTMENT | OPHTHALMIC | 0 refills | Status: DC

## 2021-10-24 MED ORDER — ERYTHROMYCIN 5 MG/GM OP OINT
TOPICAL_OINTMENT | Freq: Once | OPHTHALMIC | Status: AC
  Administered 2021-10-24: 1 via OPHTHALMIC
  Filled 2021-10-24: qty 3.5

## 2021-10-24 NOTE — ED Notes (Signed)
Pt ambulatory to waiting room. Pt verbalized understanding of discharge instructions.   

## 2021-10-24 NOTE — ED Provider Notes (Signed)
?[facility] EMERGENCY DEPARTMENT ?Provider Note ? ? ?CSN: [id number] ?Arrival date & time: 10/24/21  0865 ? ?  ? ?History ? ?Chief Complaint  ?Patient presents with  ? Cough  ? ? ?Christine Hart is a 76 y.o. female presenting to the ED with malaise, cough and congestion for 5 days.  She reports that her daughter and granddaughter had a viral URI and "pinkeye" 2 days prior to the patient getting sick.  Patient reports she has had a cough, congestion, developed redness and irritation and watery discharge in her right eye 2 days ago.  Her PCP started her on cefdinir for possible pneumonia 2 days ago which she has been taking.  She reports she continues to have a cough, also reports a headache and some myalgias. ? ?HPI ? ?  ? ?Home Medications ?Prior to Admission medications   ?Medication Sig Start Date End Date Taking? Authorizing Provider  ?erythromycin ophthalmic ointment Place a 1/2 inch ribbon of ointment into the lower eyelid of right eye, four times daily for 5 days 10/24/21  Yes Maryjayne Kleven, Carola Rhine, MD  ?acetaminophen (TYLENOL) 500 MG tablet Take 500 mg by mouth every 6 (six) hours as needed for mild pain, fever or headache.    [provider]  ?ALPRAZolam Duanne Moron) 0.5 MG tablet Take 0.5 mg by mouth 3 (three) times daily as needed for anxiety. 05/14/21   [provider]  ?celecoxib (CELEBREX) 200 MG capsule Take 200 mg by mouth 2 (two) times daily as needed for mild pain. 05/11/21   [provider]  ?chlorpheniramine-HYDROcodone (TUSSIONEX) 10-8 MG/5ML SUER Take 5 mLs by mouth every 12 (twelve) hours as needed for cough. 05/28/21   Ezekiel Slocumb, DO  ?cyclobenzaprine (FLEXERIL) 10 MG tablet Take 1 tablet (10 mg total) by mouth 3 (three) times daily as needed for muscle spasms. 02/17/17   Newman Pies, MD  ?ibuprofen (ADVIL) 200 MG tablet Take 400 mg by mouth every 6 (six) hours as needed for headache, fever or mild pain.    [provider]  ?metoprolol succinate  (TOPROL-XL) 50 MG 24 hr tablet Take 50 mg by mouth daily. 03/14/21   [provider]  ?telmisartan (MICARDIS) 40 MG tablet Take 40 mg by mouth daily. 02/14/21   [provider]  ?traMADol (ULTRAM) 50 MG tablet Take 50 mg by mouth 3 (three) times daily as needed for moderate pain.    [provider]  ?   ? ?Allergies    ?Patient has no known allergies.   ? ?Review of Systems   ?Review of Systems ? ?Physical Exam ?Updated Vital Signs ?BP (!) 166/82   Pulse 94   Temp 98.2 ?F (36.8 ?C) (Oral)   Resp 18   SpO2 97%  ?Physical Exam ?Constitutional:   ?   General: She is not in acute distress. ?HENT:  ?   Head: Normocephalic and atraumatic.  ?Eyes:  ?   Pupils: Pupils are equal, round, and reactive to light.  ?   Comments: Right conjunctiva is injected, watery but no mucoid discharge  ?Cardiovascular:  ?   Rate and Rhythm: Normal rate and regular rhythm.  ?Pulmonary:  ?   Effort: Pulmonary effort is normal. No respiratory distress.  ?Abdominal:  ?   General: There is no distension.  ?   Tenderness: There is no abdominal tenderness.  ?Skin: ?   General: Skin is warm and dry.  ?Neurological:  ?   General: No focal deficit present.  ?  Mental Status: She is alert. Mental status is at baseline.  ?Psychiatric:     ?   Mood and Affect: Mood normal.     ?   Behavior: Behavior normal.  ? ? ?ED Results / Procedures / Treatments   ?Labs ?(all labs ordered are listed, but only abnormal results are displayed) ?Labs Reviewed  ?RESP PANEL BY RT-PCR (FLU A&B, COVID) [id number]  ? ? ?EKG ?None ? ?Radiology ?DG Chest 2 View ? ?Result Date: 10/24/2021 ?CLINICAL DATA:  76 year old female with cough congestion fever right eye drainage and redness. EXAM: CHEST - 2 VIEW COMPARISON:  Chest radiographs 05/26/2021 and earlier. FINDINGS: Chronic right axillary and chest wall surgical clips. Chronic ACDF. Lung volumes and mediastinal contours are stable from last year, with mild cardiomegaly and evidence of some right heart  enlargement on the lateral view. Visualized tracheal air column is within normal limits. Both lungs appear clear. No pneumothorax or pleural effusion. No acute osseous abnormality identified. Negative visible bowel gas. IMPRESSION: Stable cardiomegaly. No acute cardiopulmonary abnormality. Electronically Signed   By: Genevie Ann M.D.   On: 10/24/2021 10:15   ? ?Procedures ?Procedures  ? ? ?Medications Ordered in ED ?Medications  ?erythromycin ophthalmic ointment (1 application. Right Eye Given 10/24/21 0926)  ? ? ?ED Course/ Medical Decision Making/ A&P ?  ?                        ?Medical Decision Making ?Amount and/or Complexity of Data Reviewed ?Radiology: ordered. ? ?Risk ?Prescription drug management. ? ? ?Patient is here with suspected viral upper respiratory infection, sick contact in the house, likely has a viral conjunctivitis but I cannot exclude the possibility of bacterial conjunctivitis which could be contagious and obtained from a sick family member.  I think is reasonable to treat with erythromycin ointment.  We will start here.  I ordered and reviewed and interpreted the patient's chest x-ray which shows no focal infiltrate.  She is well-appearing, no respiratory distress, no hypoxia, afebrile.  I doubt sepsis.  Her COVID and flu test are negative.  I advised conservative care for the next several days, would anticipate viral syndrome to finish within a week on average.  She can follow-up with her PCP.  She and her husband verbalized understanding ? ? ? ? ? ? ? ?Final Clinical Impression(s) / ED Diagnoses ?Final diagnoses:  ?Viral URI with cough  ?Conjunctivitis of right eye, unspecified conjunctivitis type  ? ? ?Rx / DC Orders ?ED Discharge Orders   ? ?      Ordered  ?  erythromycin ophthalmic ointment       ? 10/24/21 1024  ? ?  ?  ? ?  ? ? ?  ?Wyvonnia Dusky, MD ?10/24/21 1026 ? ?

## 2021-10-24 NOTE — ED Triage Notes (Signed)
Pt reports cough, congestion, fever, and right eye drainage and redness. Pt reports seeing her PCP on Wednesday and was prescribed Cefdinir.  ?

## 2021-10-29 ENCOUNTER — Other Ambulatory Visit: Payer: Self-pay

## 2021-10-29 ENCOUNTER — Ambulatory Visit
Admission: EM | Admit: 2021-10-29 | Discharge: 2021-10-29 | Disposition: A | Discharge status: Home / Self Care | Payer: PPO

## 2021-10-29 ENCOUNTER — Encounter (HOSPITAL_BASED_OUTPATIENT_CLINIC_OR_DEPARTMENT_OTHER): Payer: Self-pay | Admitting: *Deleted

## 2021-10-29 ENCOUNTER — Inpatient Hospital Stay (HOSPITAL_BASED_OUTPATIENT_CLINIC_OR_DEPARTMENT_OTHER)
Admission: EM | Admit: 2021-10-29 | Discharge: 2021-10-30 | DRG: 871 | Disposition: A | Discharge status: Home / Self Care | Payer: PPO | Attending: Family Medicine | Admitting: Family Medicine

## 2021-10-29 ENCOUNTER — Inpatient Hospital Stay (HOSPITAL_COMMUNITY): Payer: PPO

## 2021-10-29 ENCOUNTER — Emergency Department (HOSPITAL_BASED_OUTPATIENT_CLINIC_OR_DEPARTMENT_OTHER): Payer: PPO

## 2021-10-29 DIAGNOSIS — Z90710 Acquired absence of both cervix and uterus: Secondary | ICD-10-CM

## 2021-10-29 DIAGNOSIS — J101 Influenza due to other identified influenza virus with other respiratory manifestations: Secondary | ICD-10-CM

## 2021-10-29 DIAGNOSIS — J181 Lobar pneumonia, unspecified organism: Secondary | ICD-10-CM | POA: Diagnosis not present

## 2021-10-29 DIAGNOSIS — R079 Chest pain, unspecified: Secondary | ICD-10-CM | POA: Diagnosis not present

## 2021-10-29 DIAGNOSIS — R0602 Shortness of breath: Secondary | ICD-10-CM | POA: Diagnosis not present

## 2021-10-29 DIAGNOSIS — Z79891 Long term (current) use of opiate analgesic: Secondary | ICD-10-CM

## 2021-10-29 DIAGNOSIS — Z981 Arthrodesis status: Secondary | ICD-10-CM

## 2021-10-29 DIAGNOSIS — E871 Hypo-osmolality and hyponatremia: Secondary | ICD-10-CM

## 2021-10-29 DIAGNOSIS — A419 Sepsis, unspecified organism: Principal | ICD-10-CM

## 2021-10-29 DIAGNOSIS — J189 Pneumonia, unspecified organism: Principal | ICD-10-CM

## 2021-10-29 DIAGNOSIS — M1990 Unspecified osteoarthritis, unspecified site: Secondary | ICD-10-CM | POA: Diagnosis present

## 2021-10-29 DIAGNOSIS — J988 Other specified respiratory disorders: Secondary | ICD-10-CM

## 2021-10-29 DIAGNOSIS — F419 Anxiety disorder, unspecified: Secondary | ICD-10-CM

## 2021-10-29 DIAGNOSIS — Z96698 Presence of other orthopedic joint implants: Secondary | ICD-10-CM | POA: Diagnosis present

## 2021-10-29 DIAGNOSIS — Z20822 Contact with and (suspected) exposure to covid-19: Secondary | ICD-10-CM | POA: Diagnosis not present

## 2021-10-29 DIAGNOSIS — D75839 Thrombocytosis, unspecified: Secondary | ICD-10-CM | POA: Diagnosis not present

## 2021-10-29 DIAGNOSIS — I10 Essential (primary) hypertension: Secondary | ICD-10-CM | POA: Diagnosis present

## 2021-10-29 DIAGNOSIS — J029 Acute pharyngitis, unspecified: Secondary | ICD-10-CM | POA: Diagnosis not present

## 2021-10-29 DIAGNOSIS — Z79899 Other long term (current) drug therapy: Secondary | ICD-10-CM

## 2021-10-29 DIAGNOSIS — R509 Fever, unspecified: Secondary | ICD-10-CM | POA: Diagnosis present

## 2021-10-29 DIAGNOSIS — J129 Viral pneumonia, unspecified: Secondary | ICD-10-CM | POA: Diagnosis present

## 2021-10-29 DIAGNOSIS — R062 Wheezing: Secondary | ICD-10-CM | POA: Diagnosis not present

## 2021-10-29 DIAGNOSIS — R059 Cough, unspecified: Secondary | ICD-10-CM | POA: Diagnosis not present

## 2021-10-29 LAB — CBC WITH DIFFERENTIAL/PLATELET
Abs Immature Granulocytes: 0.12 10*3/uL — ABNORMAL HIGH (ref 0.00–0.07)
Basophils Absolute: 0.1 10*3/uL (ref 0.0–0.1)
Basophils Relative: 0 %
Eosinophils Absolute: 0.1 10*3/uL (ref 0.0–0.5)
Eosinophils Relative: 0 %
HCT: 39.9 % (ref 36.0–46.0)
Hemoglobin: 13 g/dL (ref 12.0–15.0)
Immature Granulocytes: 1 %
Lymphocytes Relative: 9 %
Lymphs Abs: 1.8 10*3/uL (ref 0.7–4.0)
MCH: 29 pg (ref 26.0–34.0)
MCHC: 32.6 g/dL (ref 30.0–36.0)
MCV: 88.9 fL (ref 80.0–100.0)
Monocytes Absolute: 1.1 10*3/uL — ABNORMAL HIGH (ref 0.1–1.0)
Monocytes Relative: 5 %
Neutro Abs: 18.1 10*3/uL — ABNORMAL HIGH (ref 1.7–7.7)
Neutrophils Relative %: 85 %
Platelets: 439 10*3/uL — ABNORMAL HIGH (ref 150–400)
RBC: 4.49 MIL/uL (ref 3.87–5.11)
RDW: 12.7 % (ref 11.5–15.5)
WBC: 21.3 10*3/uL — ABNORMAL HIGH (ref 4.0–10.5)
nRBC: 0 % (ref 0.0–0.2)

## 2021-10-29 LAB — RESP PANEL BY RT-PCR (FLU A&B, COVID) ARPGX2
Influenza A by PCR: NEGATIVE
Influenza B by PCR: NEGATIVE
SARS Coronavirus 2 by RT PCR: NEGATIVE

## 2021-10-29 LAB — COMPREHENSIVE METABOLIC PANEL
ALT: 18 U/L (ref 0–44)
AST: 26 U/L (ref 15–41)
Albumin: 3.7 g/dL (ref 3.5–5.0)
Alkaline Phosphatase: 72 U/L (ref 38–126)
Anion gap: 11 (ref 5–15)
BUN: 8 mg/dL (ref 8–23)
CO2: 23 mmol/L (ref 22–32)
Calcium: 8.9 mg/dL (ref 8.9–10.3)
Chloride: 95 mmol/L — ABNORMAL LOW (ref 98–111)
Creatinine, Ser: 0.64 mg/dL (ref 0.44–1.00)
GFR, Estimated: >60 mL/min (ref >60)
Glucose, Bld: 101 mg/dL — ABNORMAL HIGH (ref 70–99)
Potassium: 3.5 mmol/L (ref 3.5–5.1)
Sodium: 129 mmol/L — ABNORMAL LOW (ref 135–145)
Total Bilirubin: 0.7 mg/dL (ref 0.3–1.2)
Total Protein: 8.5 g/dL — ABNORMAL HIGH (ref 6.5–8.1)

## 2021-10-29 LAB — URINALYSIS, ROUTINE W REFLEX MICROSCOPIC
Bilirubin Urine: NEGATIVE
Glucose, UA: NEGATIVE mg/dL
Ketones, ur: NEGATIVE mg/dL
Nitrite: NEGATIVE
Protein, ur: NEGATIVE mg/dL
Specific Gravity, Urine: 1.01 (ref 1.005–1.030)
pH: 7 (ref 5.0–8.0)

## 2021-10-29 LAB — URINALYSIS, MICROSCOPIC (REFLEX)

## 2021-10-29 LAB — LACTIC ACID, PLASMA
Lactic Acid, Venous: 1.6 mmol/L (ref 0.5–1.9)
Lactic Acid, Venous: 1.1 mmol/L (ref 0.5–1.9)

## 2021-10-29 MED ORDER — SODIUM CHLORIDE 0.9 % IV SOLN
500.0000 mg | Freq: Once | INTRAVENOUS | Status: AC
  Administered 2021-10-29: 500 mg via INTRAVENOUS
  Filled 2021-10-29: qty 5

## 2021-10-29 MED ORDER — SODIUM CHLORIDE 0.9 % IV SOLN: INTRAVENOUS | Status: DC

## 2021-10-29 MED ORDER — ACETAMINOPHEN 325 MG PO TABS
ORAL_TABLET | ORAL | Status: AC
  Administered 2021-10-29: 650 mg via ORAL
  Filled 2021-10-29: qty 2

## 2021-10-29 MED ORDER — SODIUM CHLORIDE 0.9 % IV SOLN
1.0000 g | Freq: Once | INTRAVENOUS | Status: AC
  Administered 2021-10-29: 1 g via INTRAVENOUS
  Filled 2021-10-29: qty 10

## 2021-10-29 MED ORDER — IOHEXOL 350 MG/ML SOLN
75.0000 mL | Freq: Once | INTRAVENOUS | Status: AC | PRN
  Administered 2021-10-29: 75 mL via INTRAVENOUS

## 2021-10-29 MED ORDER — METOPROLOL SUCCINATE ER 50 MG PO TB24
50.0000 mg | ORAL_TABLET | Freq: Every day | ORAL | Status: DC
  Administered 2021-10-30: 50 mg via ORAL
  Filled 2021-10-29: qty 1

## 2021-10-29 MED ORDER — IRBESARTAN 150 MG PO TABS
150.0000 mg | ORAL_TABLET | Freq: Every day | ORAL | Status: DC
  Administered 2021-10-30: 150 mg via ORAL
  Filled 2021-10-29: qty 1

## 2021-10-29 MED ORDER — ACETAMINOPHEN 325 MG PO TABS
650.0000 mg | ORAL_TABLET | Freq: Once | ORAL | Status: AC
Start: 2021-10-29 — End: 2021-10-29

## 2021-10-29 MED ORDER — HYDROCOD POLI-CHLORPHE POLI ER 10-8 MG/5ML PO SUER
5.0000 mL | Freq: Two times a day (BID) | ORAL | Status: DC | PRN
  Administered 2021-10-29 – 2021-10-30 (×2): 5 mL via ORAL
  Filled 2021-10-29 (×2): qty 5

## 2021-10-29 MED ORDER — ACETAMINOPHEN 650 MG RE SUPP: 650.0000 mg | Freq: Four times a day (QID) | RECTAL | Status: DC | PRN

## 2021-10-29 MED ORDER — ENOXAPARIN SODIUM 40 MG/0.4ML IJ SOSY
40.0000 mg | PREFILLED_SYRINGE | INTRAMUSCULAR | Status: DC
  Administered 2021-10-29: 40 mg via SUBCUTANEOUS
  Filled 2021-10-29: qty 0.4

## 2021-10-29 MED ORDER — POLYETHYLENE GLYCOL 3350 17 G PO PACK: 17.0000 g | PACK | Freq: Every day | ORAL | Status: DC | PRN

## 2021-10-29 MED ORDER — ACETAMINOPHEN 325 MG PO TABS
650.0000 mg | ORAL_TABLET | Freq: Four times a day (QID) | ORAL | Status: DC | PRN
  Administered 2021-10-29 – 2021-10-30 (×2): 650 mg via ORAL
  Filled 2021-10-29 (×2): qty 2

## 2021-10-29 MED ORDER — AMLODIPINE BESYLATE 5 MG PO TABS
5.0000 mg | ORAL_TABLET | Freq: Every day | ORAL | Status: DC
  Administered 2021-10-30: 5 mg via ORAL
  Filled 2021-10-29: qty 1

## 2021-10-29 MED ORDER — SODIUM CHLORIDE 0.9 % IV SOLN
2.0000 g | INTRAVENOUS | Status: DC
  Filled 2021-10-29: qty 20

## 2021-10-29 MED ORDER — ALPRAZOLAM 0.5 MG PO TABS
0.5000 mg | ORAL_TABLET | Freq: Three times a day (TID) | ORAL | Status: DC | PRN
  Administered 2021-10-29: 0.5 mg via ORAL
  Filled 2021-10-29: qty 1

## 2021-10-29 MED ORDER — AZITHROMYCIN 250 MG PO TABS
500.0000 mg | ORAL_TABLET | Freq: Every day | ORAL | Status: DC
  Administered 2021-10-30: 500 mg via ORAL
  Filled 2021-10-29: qty 2

## 2021-10-29 NOTE — ED Notes (Signed)
Patient is being discharged from the Urgent Care and sent to the Emergency Department via POA with spouse. Per L. Morgan-Scales PA-C, patient is in need of higher level of care due to need for further evaluation. Patient is aware and verbalizes understanding of plan of care.  ?Vitals:  ? 10/29/21 1136 10/29/21 1137  ?BP: (!) 177/90 (!) 176/83  ?Pulse: (!) 107   ?Resp: 20   ?Temp: 99.8 ?F (37.7 ?C)   ?SpO2: 91%   ?  ?

## 2021-10-29 NOTE — ED Triage Notes (Addendum)
Pt states for 11 days she has been dealing with a URI, she states she has had 2 negative Covid and flu tests, she reports having congestion, productive cough, and sore throat.  ?

## 2021-10-29 NOTE — H&P (Signed)
?History and Physical  ? ? [name] SRP:[id number] DOB: 1946-03-10 DOA: 10/29/2021 ? ?PCP: Street, Sharon Mt, MD  ?Patient coming from: home ? ?I have personally briefly reviewed patient's old medical records in [place] ? ?Chief Complaint: fevers, malaise, cough ? ?HPI: Christine Hart is Christine Hart 76 y.o. female with medical history significant of HTN, MVP who presented to [facility] ED with 11 days of infectious symptoms which have worsened despite outpatient abx therapy. ? ?Symptoms started 11 days ago with sore throat, cough, sneeze and congestion.  Since then, she's had progressively worse symptoms with fevers, chills, worsening dyspnea on exertion, worsening sore throat with her cough.  She initially saw her PCP who prescribed cefidinir.  She subsequently went to [facility] and was started on erythromycin ointment for conjunctivits with recommendation for conservative management of viral URI.  She's since developed fevers, chills.  Went to urgent care today who sent her to ED for chest imaging.  CXR at [facility] was without acute cardiopulm abnormality, but due to fevers, leukocytosis, and concern for pneumonia she was transferred to [facility] for additional management.  She denies smoking and drinking. ? ?ED Course: CXR, labs, blood cx, abx ? ?Review of Systems: As per HPI otherwise all other systems reviewed and are negative. ? ?Past Medical History:  ?Diagnosis Date  ? Arthritis   ? Complication of anesthesia   ? Limited mouth opening and neck mobility, s/p TMJ surgeries   ? Hypertension   ? MVP (mitral valve prolapse)   ? PONV (postoperative nausea and vomiting)   ? ? ?Past Surgical History:  ?Procedure Laterality Date  ? ABDOMINAL HYSTERECTOMY    ? APPENDECTOMY    ? CERVICAL SPINE SURGERY    ? FIRST RIB REMOVAL Right   ? HAND SURGERY Bilateral   ? NEUROMA SURGERY Left   ? POSTERIOR CERVICAL FUSION/FORAMINOTOMY N/Christine Hart 02/16/2017  ? Procedure: CERVICAL 1- CERVICAL 2 POSTERIOR INSTRUMENTATION AND FUSION;  Surgeon: Newman Pies, MD;  Location: [facility];  Service: Neurosurgery;  Laterality: N/Bard Haupert;  CERVICAL 1- CERVICAL 2 POSTERIOR INSTRUMENTATION AND FUSION  ? TMJ ARTHROPLASTY    ? ? ?Social History ? reports that she has never smoked. She has never used smokeless tobacco. She reports that she does not drink alcohol and does not use drugs. ? ?No Known Allergies ? ?No family history on file. ? ? ?Prior to Admission medications   ?Medication Sig Start Date End Date Taking? Authorizing Provider  ?acetaminophen (TYLENOL) 500 MG tablet Take 500 mg by mouth every 6 (six) hours as needed for mild pain, fever or headache.    [provider]  ?ALPRAZolam Duanne Moron) 0.5 MG tablet Take 0.5 mg by mouth 3 (three) times daily as needed for anxiety. 05/14/21   [provider]  ?amLODipine (NORVASC) 5 MG tablet Take 5 mg by mouth daily. 10/20/21   [provider]  ?chlorpheniramine-HYDROcodone (TUSSIONEX) 10-8 MG/5ML SUER Take 5 mLs by mouth every 12 (twelve) hours as needed for cough. 05/28/21   Ezekiel Slocumb, DO  ?cyclobenzaprine (FLEXERIL) 10 MG tablet Take 1 tablet (10 mg total) by mouth 3 (three) times daily as needed for muscle spasms. 02/17/17   Newman Pies, MD  ?ibuprofen (ADVIL) 200 MG tablet Take 400 mg by mouth every 6 (six) hours as needed for headache, fever or mild pain.    [provider]  ?metoprolol succinate (TOPROL-XL) 50 MG 24 hr tablet Take 50 mg by mouth daily. 03/14/21   [provider]  ?telmisartan (MICARDIS)  40 MG tablet Take 40 mg by mouth daily. 02/14/21   [provider]  ?traMADol (ULTRAM) 50 MG tablet Take 50 mg by mouth 3 (three) times daily as needed for moderate pain.    [provider]  ? ? ?Physical Exam: ?Vitals:  ? 10/29/21 1400 10/29/21 1500 10/29/21 1520 10/29/21 1700  ?BP: (!) 159/73 (!) 143/75 (!) 143/75 (!) 151/62  ?Pulse: 95 88 88 98  ?Resp: 16 18 (!) 22 20  ?Temp:   98.5 ?F (36.9 ?C) 100 ?F (37.8 ?C)  ?TempSrc:   Oral Oral  ?SpO2: 94% 92% 95% 95%   ?Weight:      ?Height:      ? ? ?Constitutional: NAD, calm, comfortable ?Vitals:  ? 10/29/21 1400 10/29/21 1500 10/29/21 1520 10/29/21 1700  ?BP: (!) 159/73 (!) 143/75 (!) 143/75 (!) 151/62  ?Pulse: 95 88 88 98  ?Resp: 16 18 (!) 22 20  ?Temp:   98.5 ?F (36.9 ?C) 100 ?F (37.8 ?C)  ?TempSrc:   Oral Oral  ?SpO2: 94% 92% 95% 95%  ?Weight:      ?Height:      ? ?Eyes: PERRL, lids and conjunctivae normal ?ENMT: dry MM ?Neck: normal, supple ?Respiratory: rhonchi on R lower lung field, no increased wob ?Cardiovascular: Regular rate and rhythm, no murmurs / rubs / gallops. No extremity edema.   ?Abdomen: no tenderness, no masses palpated.  ?Musculoskeletal: no clubbing / cyanosis. No joint deformity upper and lower extremities. Good ROM, no contractures. Normal muscle tone.  ?Skin: no rashes, lesions, ulcers. No induration ?Neurologic: CN 2-12 grossly intact. Moving all extremities ?Psychiatric: Normal judgment and insight. Alert and oriented x 3. Normal mood.  ? ?Labs on Admission: I have personally reviewed following labs and imaging studies ? ?CBC: ?Recent Labs  ?Lab 10/29/21 ?1303  ?WBC 21.3*  ?NEUTROABS 18.1*  ?HGB 13.0  ?HCT 39.9  ?MCV 88.9  ?PLT 439*  ? ? ?Basic Metabolic Panel: ?Recent Labs  ?Lab 10/29/21 ?1303  ?NA 129*  ?K 3.5  ?CL 95*  ?CO2 23  ?GLUCOSE 101*  ?BUN 8  ?CREATININE 0.64  ?CALCIUM 8.9  ? ? ?GFR: ?Estimated Creatinine Clearance: 59.8 mL/min (by C-G formula based on SCr of 0.64 mg/dL). ? ?Liver Function Tests: ?Recent Labs  ?Lab 10/29/21 ?1303  ?AST 26  ?ALT 18  ?ALKPHOS 72  ?BILITOT 0.7  ?PROT 8.5*  ?ALBUMIN 3.7  ? ? ?Urine analysis: ?   ?Component Value Date/Time  ? COLORURINE YELLOW 10/29/2021 1525  ? APPEARANCEUR CLEAR 10/29/2021 1525  ? LABSPEC 1.010 10/29/2021 1525  ? PHURINE 7.0 10/29/2021 1525  ? GLUCOSEU NEGATIVE 10/29/2021 1525  ? HGBUR TRACE (Quanasia Defino) 10/29/2021 1525  ? [place] NEGATIVE 10/29/2021 1525  ? [place] NEGATIVE 10/29/2021 1525  ? PROTEINUR NEGATIVE 10/29/2021 1525  ? NITRITE  NEGATIVE 10/29/2021 1525  ? LEUKOCYTESUR TRACE (Scharlene Catalina) 10/29/2021 1525  ? ? ?Radiological Exams on Admission: ?DG Chest 2 View ? ?Result Date: 10/29/2021 ?CLINICAL DATA:  Cough, congestion. Additional history provided: Cough, congestion, sore throat, wheezing. Flu positive on Sunday. EXAM: CHEST - 2 VIEW COMPARISON:  Prior chest radiographs 10/24/2021 and earlier. FINDINGS: Heart size within normal limits. No appreciable airspace consolidation. No evidence of pleural effusion or pneumothorax. No acute bony abnormality identified. ACDF hardware. IMPRESSION: No evidence of acute cardiopulmonary abnormality. Electronically Signed   By: Kellie Simmering D.O.   On: 10/29/2021 13:29   ? ?EKG: Independently reviewed. Sinus tach ? ?Assessment/Plan ?Principal Problem: ?  Sepsis ([place]) ?Active Problems: ?  Hypertension ?  Anxiety ?  ? ?Assessment and Plan: ?* Sepsis ([place]) ?Clinically suspect pneumonia, rhonchi to R lung base, leukocytosis, fever, tachycardia, and tachypnea ?Seen in ED on 4/2 and discharged with conservative measures ?Subsequently treated with cefdinir by PCP ?Sent here from urgent care ?CXR today without acute cardiopulm abnormality ?Follow CT chest with contrast ?Blood cultures pending, negative covid/flu tests here ?Follow MRSA PCR ?Urine strep, legionella ?RVP ?Will continue abx with ceftriaxone/azithromycin for now.  Note failure on cefdinir, would have low threshold to broaden if she worsens, but given clinical stability, continue CAP coverage at this time. ? ?Hypertension ?Continue micardis, metop, amlodipine ? ?Anxiety ?xanax ? ? ?DVT prophylaxis: lovenox  ?Code Status:   full  ?Family Communication:  husband  ?Disposition Plan:  ? Patient is from:  home ? Anticipated DC to:  home ? Anticipated DC date:  pending ? Anticipated DC barriers: Pending improvement  ?Consults called:  none  ?Admission status:  none  ? ?Severity of Illness: ?The appropriate patient status for this patient is OBSERVATION. Observation  status is judged to be reasonable and necessary in order to provide the required intensity of service to ensure the patient's safety. The patient's presenting symptoms, physical exam findings, and initial radiogra

## 2021-10-29 NOTE — ED Notes (Signed)
Phone Handoff Report given to [facility] Transport Team 

## 2021-10-29 NOTE — ED Provider Notes (Signed)
?[facility] ? ? ? ?CSN: [id number] ?Arrival date & time: 10/29/21  1121 ?  ? ?HISTORY  ? ?Chief Complaint  ?Patient presents with  ? Nasal Congestion  ? Sore Throat  ? Cough  ? ?HPI ?Christine Hart is a 76 y.o. female. Patient presents to urgent care today complaining of an upper respiratory infection that has persisted for 11 days.  Patient reports 2 negative COVID and influenza tests.  Patient states she is still having congestion, productive cough as well as sore throat.  On arrival today, patient has a blood pressure of 176/83, pulse of 107 and an O2 of 91%.  Patient has a temp of 99.8. ? ?EMR reviewed.  Patient actually tested positive for influenza A at her ED visit, the result was completed on October 26, 2021.  It is unclear why she was not made aware of this. ? ?Patient was evaluated in the emergency room on October 24, 2021, 5 days ago, with the same complaint.  At that time, COVID and flu test were negative.  CT scan performed during that visit was unremarkable for any pulmonary or cardiac disease.  Patient was advised symptoms should resolve within a week, "on average".  Patient was provided with erythromycin for possible bacterial conjunctivitis.  Patient was hospitalized in November 2022 for bilateral interstitial pneumonia likely secondary to diagnosis of influenza A in October 2022.  Patient also has regular follow-up with her primary care provider for supraventricular tachycardia, previous episodes of syncope due to SVT as well as bronchitis not specified is chronic or recurrent.  Patient has no history of smoking tobacco products.  Patient is also not currently prescribed or using any medications for her chronic versus recurrent bronchitis acutely or preventatively nor has she ever been prescribed any. ? ?The history is provided by the patient.  ?Past Medical History:  ?Diagnosis Date  ? Arthritis   ? Complication of anesthesia   ? Limited mouth opening and neck mobility, s/p TMJ surgeries   ?  Hypertension   ? MVP (mitral valve prolapse)   ? PONV (postoperative nausea and vomiting)   ? ?Patient Active Problem List  ? Diagnosis Date Noted  ? Influenza A 05/26/2021  ? Hypertension   ? Acute respiratory failure with hypoxia ([place])   ? Arthropathy of cervical facet joint 02/16/2017  ? ?Past Surgical History:  ?Procedure Laterality Date  ? ABDOMINAL HYSTERECTOMY    ? APPENDECTOMY    ? CERVICAL SPINE SURGERY    ? FIRST RIB REMOVAL Right   ? HAND SURGERY Bilateral   ? NEUROMA SURGERY Left   ? POSTERIOR CERVICAL FUSION/FORAMINOTOMY N/A 02/16/2017  ? Procedure: CERVICAL 1- CERVICAL 2 POSTERIOR INSTRUMENTATION AND FUSION;  Surgeon: Newman Pies, MD;  Location: [facility];  Service: Neurosurgery;  Laterality: N/A;  CERVICAL 1- CERVICAL 2 POSTERIOR INSTRUMENTATION AND FUSION  ? TMJ ARTHROPLASTY    ? ?OB History   ?No obstetric history on file. ?  ? ?Home Medications   ? ?Prior to Admission medications   ?Medication Sig Start Date End Date Taking? Authorizing Provider  ?acetaminophen (TYLENOL) 500 MG tablet Take 500 mg by mouth every 6 (six) hours as needed for mild pain, fever or headache.    [provider]  ?ALPRAZolam Duanne Moron) 0.5 MG tablet Take 0.5 mg by mouth 3 (three) times daily as needed for anxiety. 05/14/21   [provider]  ?celecoxib (CELEBREX) 200 MG capsule Take 200 mg by mouth 2 (two) times daily as needed for mild  pain. 05/11/21   [provider]  ?chlorpheniramine-HYDROcodone (TUSSIONEX) 10-8 MG/5ML SUER Take 5 mLs by mouth every 12 (twelve) hours as needed for cough. 05/28/21   Ezekiel Slocumb, DO  ?cyclobenzaprine (FLEXERIL) 10 MG tablet Take 1 tablet (10 mg total) by mouth 3 (three) times daily as needed for muscle spasms. 02/17/17   Newman Pies, MD  ?erythromycin ophthalmic ointment Place a 1/2 inch ribbon of ointment into the lower eyelid of right eye, four times daily for 5 days 10/24/21   Wyvonnia Dusky, MD  ?ibuprofen (ADVIL) 200 MG tablet Take 400 mg by mouth  every 6 (six) hours as needed for headache, fever or mild pain.    [provider]  ?metoprolol succinate (TOPROL-XL) 50 MG 24 hr tablet Take 50 mg by mouth daily. 03/14/21   [provider]  ?telmisartan (MICARDIS) 40 MG tablet Take 40 mg by mouth daily. 02/14/21   [provider]  ?traMADol (ULTRAM) 50 MG tablet Take 50 mg by mouth 3 (three) times daily as needed for moderate pain.    [provider]  ? ?Family History ?History reviewed. No pertinent family history. ?Social History ?Social History  ? ?Tobacco Use  ? Smoking status: Never  ? Smokeless tobacco: Never  ?Substance Use Topics  ? Alcohol use: No  ? Drug use: No  ? ?Allergies   ?Patient has no known allergies. ? ?Review of Systems ?Review of Systems ?Pertinent findings noted in history of present illness.  ? ?Physical Exam ?Triage Vital Signs ?ED Triage Vitals  ?Enc Vitals Group  ?   BP 05/21/21 0827 (!) 147/82  ?   Pulse Rate 05/21/21 0827 72  ?   Resp 05/21/21 0827 18  ?   Temp 05/21/21 0827 98.3 ?F (36.8 ?C)  ?   Temp Source 05/21/21 0827 Oral  ?   SpO2 05/21/21 0827 98 %  ?   Weight --   ?   Height --   ?   Head Circumference --   ?   Peak Flow --   ?   Pain Score 05/21/21 0826 5  ?   Pain Loc --   ?   Pain Edu? --   ?   Excl. in [place]? --   ?No data found. ? ?Updated Vital Signs ?BP (!) 176/83 (BP Location: Left Arm)   Pulse (!) 107   Temp 99.8 ?F (37.7 ?C) (Oral)   Resp 20   SpO2 91% Comment: Provider aware ? ?Physical Exam ?Constitutional:   ?   General: She is not in acute distress. ?   Appearance: She is ill-appearing. She is not toxic-appearing or diaphoretic.  ?HENT:  ?   Head: Normocephalic and atraumatic.  ?Cardiovascular:  ?   Rate and Rhythm: Normal rate and regular rhythm.  ?   Pulses: Normal pulses.  ?   Heart sounds: Normal heart sounds, S1 normal and S2 normal.  ?Pulmonary:  ?   Effort: No tachypnea, bradypnea, accessory muscle usage, prolonged expiration, respiratory distress or retractions.  ?    Breath sounds: Examination of the right-upper field reveals wheezing, rhonchi and rales. Examination of the left-upper field reveals decreased breath sounds. Examination of the right-middle field reveals rhonchi and rales. Examination of the left-middle field reveals decreased breath sounds. Examination of the right-lower field reveals rales. Examination of the left-lower field reveals decreased breath sounds. Decreased breath sounds, wheezing, rhonchi and rales present.  ?   Comments: Abnormal egophony and dullness to percussion in  left middle and lower lobes  ?Chest:  ?   Chest wall: No mass, lacerations, deformity, swelling, tenderness, crepitus or edema. There is dullness to percussion (Left middle and lower lobes).  ?Lymphadenopathy:  ?   Cervical: Cervical adenopathy present.  ?Neurological:  ?   Mental Status: She is alert.  ? ? ?Visual Acuity ?Right Eye Distance:   ?Left Eye Distance:   ?Bilateral Distance:   ? ?Right Eye Near:   ?Left Eye Near:    ?Bilateral Near:    ? ?UC Couse / Diagnostics / Procedures:  ?  ?EKG ? ?Radiology ?No results found. ? ?Procedures ?Procedures (including critical care time) ? ?UC Diagnoses / Final Clinical Impressions(s)   ?I have reviewed the triage vital signs and the nursing notes. ? ?Pertinent labs & imaging results that were available during my care of the patient were reviewed by me and considered in my medical decision making (see chart for details).   ?Final diagnoses:  ?Influenza A  ?Consolidation of left lower lobe of lung ([place])  ?Consolidation of middle lobe of lung ([place])  ?Respiratory infection  ? ?Patient was advised to go to the emergency room now evaluation for possible pleural effusion.  Patient's husband states he will take her now. ? ?ED Prescriptions   ?None ?  ? ?PDMP not reviewed this encounter. ? ?Pending results:  ?Labs Reviewed - No data to display ? ?Medications Ordered in UC: ?Medications - No data to display ? ?Disposition Upon Discharge:  ?Condition:  stable for discharge home ?Home: take medications as prescribed; routine discharge instructions as discussed; follow up as advised. ? ?Patient presented with an acute illness with associated systemic symptoms

## 2021-10-29 NOTE — Assessment & Plan Note (Signed)
Continue micardis, metop, amlodipine ?

## 2021-10-29 NOTE — Discharge Instructions (Addendum)
Please go to the emergency room now for further evaluation of respiratory infection. ?

## 2021-10-29 NOTE — Assessment & Plan Note (Addendum)
Present on admission ?Pneumonia on CT chest, no PE ?Blood cultures pending, negative covid/flu tests  ?Follow MRSA PCR ?Urine strep, legionella ?RVP ?Will continue abx with ceftriaxone/azithromycin for now.  Note failure on cefdinir, would have low threshold to broaden if she worsens, but given clinical stability, continue CAP coverage at this time. ?

## 2021-10-29 NOTE — ED Notes (Signed)
[facility] Team at bedside 

## 2021-10-29 NOTE — ED Provider Notes (Addendum)
[facility] ?Provider Note ? ? ?CSN: [id number] ?Arrival date & time: 10/29/21  1223 ? ?  ? ?History ?PMH: HTN ?Chief Complaint  ?Patient presents with  ? Cough  ? ? ?Christine Hart is a 76 y.o. female. ?Presents the emergency department with a chief complaint of shortness of breath.  She states that she has been feeling unwell for about 11 days now.  This started out with upper respiratory symptoms with fever, cough, throughout.  And sore throat.  She was reportedly seen at [facility] on April 2.  At that point she had been sick for 5 days and her PCP had put her on cefdinir.  Patient was told that she had a positive flu test at this time, however, with chart review, it looks like it is negative.  And was discharged home.  She said that her symptoms continue to worsen with a worsening productive cough with green sputum, decreased appetite, and continued sore throat.  She has continued to have fevers up to 103.  Her husband also has gotten sick recently.  She says this morning she was checking her pulse ox and it was 82%, but this got better pretty briefly after checking it.  She reports went to urgent care today and was seen.  Urgent care provider referred her here for further management.  Urgent care had concerned that she had a pleural effusion. ?Reports that she was hospitalized back in November for pneumonia and that her symptoms feel very similar. ? ?Cough ?Associated symptoms: chills, fever, shortness of breath and sore throat   ?Associated symptoms: no chest pain   ? ?  ? ?Home Medications ?Prior to Admission medications   ?Medication Sig Start Date End Date Taking? Authorizing Provider  ?acetaminophen (TYLENOL) 500 MG tablet Take 500 mg by mouth every 6 (six) hours as needed for mild pain, fever or headache.    [provider]  ?ALPRAZolam Duanne Moron) 0.5 MG tablet Take 0.5 mg by mouth 3 (three) times daily as needed for anxiety. 05/14/21   [provider]  ?amLODipine (NORVASC) 5 MG tablet Take 5 mg by mouth daily. 10/20/21   [provider]  ?chlorpheniramine-HYDROcodone (TUSSIONEX) 10-8 MG/5ML SUER Take 5 mLs by mouth every 12 (twelve) hours as needed for cough. 05/28/21   Ezekiel Slocumb, DO  ?cyclobenzaprine (FLEXERIL) 10 MG tablet Take 1 tablet (10 mg total) by mouth 3 (three) times daily as needed for muscle spasms. 02/17/17   Newman Pies, MD  ?ibuprofen (ADVIL) 200 MG tablet Take 400 mg by mouth every 6 (six) hours as needed for headache, fever or mild pain.    [provider]  ?metoprolol succinate (TOPROL-XL) 50 MG 24 hr tablet Take 50 mg by mouth daily. 03/14/21   [provider]  ?telmisartan (MICARDIS) 40 MG tablet Take 40 mg by mouth daily. 02/14/21   [provider]  ?traMADol (ULTRAM) 50 MG tablet Take 50 mg by mouth 3 (three) times daily as needed for moderate pain.    [provider]  ?   ? ?Allergies    ?Patient has no known allergies.   ? ?Review of Systems   ?Review of Systems  ?Constitutional:  Positive for appetite change, chills and fever.  ?HENT:  Positive for sore throat.   ?Respiratory:  Positive for cough and shortness of breath.   ?Cardiovascular:  Negative for chest pain.  ?Gastrointestinal:  Negative for abdominal pain, nausea and vomiting.  ?All other  systems reviewed and are negative. ? ?Physical Exam ?Updated Vital Signs ?BP (!) 151/62 (BP Location: Right Arm)   Pulse 98   Temp 100 ?F (37.8 ?C) (Oral)   Resp 20   Ht 5' 6.5" (1.689 m)   Wt 70.3 kg   SpO2 95%   BMI 24.64 kg/m?  ?Physical Exam ?Vitals and nursing note reviewed.  ?Constitutional:   ?   General: She is not in acute distress. ?   Appearance: Normal appearance. She is ill-appearing. She is not toxic-appearing or diaphoretic.  ?HENT:  ?   Head: Normocephalic and atraumatic.  ?   Right Ear: Tympanic membrane, ear canal and external ear normal. There is no impacted cerumen.  ?   Left Ear: Tympanic membrane,  ear canal and external ear normal. There is no impacted cerumen.  ?   Nose: Nose normal. No nasal deformity.  ?   Mouth/Throat:  ?   Lips: Pink. No lesions.  ?   Mouth: Mucous membranes are moist. No injury, lacerations, oral lesions or angioedema.  ?   Pharynx: Oropharynx is clear. Uvula midline. No pharyngeal swelling, oropharyngeal exudate, posterior oropharyngeal erythema or uvula swelling.  ?Eyes:  ?   General: Gaze aligned appropriately. No scleral icterus.    ?   Right eye: No discharge.     ?   Left eye: No discharge.  ?   Conjunctiva/sclera: Conjunctivae normal.  ?   Right eye: Right conjunctiva is not injected. No exudate or hemorrhage. ?   Left eye: Left conjunctiva is not injected. No exudate or hemorrhage. ?Cardiovascular:  ?   Rate and Rhythm: Regular rhythm. Tachycardia present.  ?   Pulses: Normal pulses.     ?     Radial pulses are 2+ on the right side and 2+ on the left side.  ?     Dorsalis pedis pulses are 2+ on the right side and 2+ on the left side.  ?   Heart sounds: Normal heart sounds, S1 normal and S2 normal. Heart sounds not distant. No murmur heard. ?  No friction rub. No gallop. No S3 or S4 sounds.  ?Pulmonary:  ?   Effort: Pulmonary effort is normal. No accessory muscle usage or respiratory distress.  ?   Breath sounds: No stridor. Rhonchi present. No wheezing or rales.  ?   Comments: Decreased lung sounds on left. Increased coarse lung fields on right with rhonchi in upper and middle lobes. Productive cough heard ?Chest:  ?   Chest wall: No tenderness.  ?Abdominal:  ?   General: Abdomen is flat. Bowel sounds are normal. There is no distension.  ?   Palpations: Abdomen is soft. There is no mass or pulsatile mass.  ?   Tenderness: There is no abdominal tenderness. There is no guarding or rebound.  ?Musculoskeletal:  ?   Cervical back: Neck supple.  ?   Right lower leg: No edema.  ?   Left lower leg: No edema.  ?Lymphadenopathy:  ?   Cervical: No cervical adenopathy.  ?Skin: ?   General:  Skin is warm and dry.  ?   Coloration: Skin is not jaundiced or pale.  ?   Findings: No bruising, erythema, lesion or rash.  ?Neurological:  ?   General: No focal deficit present.  ?   Mental Status: She is alert and oriented to person, place, and time.  ?   GCS: GCS eye subscore is 4. GCS verbal subscore is 5. GCS motor subscore  is 6.  ?Psychiatric:     ?   Mood and Affect: Mood normal.     ?   Behavior: Behavior normal. Behavior is cooperative.  ? ? ?ED Results / Procedures / Treatments   ?Labs ?(all labs ordered are listed, but only abnormal results are displayed) ?Labs Reviewed  ?CBC WITH DIFFERENTIAL/PLATELET - Abnormal; Notable for the following components:  ?    Result Value  ? WBC 21.3 (*)   ? Platelets 439 (*)   ? Neutro Abs 18.1 (*)   ? Monocytes Absolute 1.1 (*)   ? Abs Immature Granulocytes 0.12 (*)   ? All other components within normal limits  ?COMPREHENSIVE METABOLIC PANEL - Abnormal; Notable for the following components:  ? Sodium 129 (*)   ? Chloride 95 (*)   ? Glucose, Bld 101 (*)   ? Total Protein 8.5 (*)   ? All other components within normal limits  ?URINALYSIS, ROUTINE W REFLEX MICROSCOPIC - Abnormal; Notable for the following components:  ? Hgb urine dipstick TRACE (*)   ? Leukocytes,Ua TRACE (*)   ? All other components within normal limits  ?URINALYSIS, MICROSCOPIC (REFLEX) - Abnormal; Notable for the following components:  ? Bacteria, UA RARE (*)   ? All other components within normal limits  ?RESP PANEL BY RT-PCR (FLU A&B, COVID) [id number]  ?CULTURE, BLOOD (ROUTINE X 2)  ?CULTURE, BLOOD (ROUTINE X 2)  ?URINE CULTURE  ?LACTIC ACID, PLASMA  ?LACTIC ACID, PLASMA  ? ? ?EKG ?EKG Interpretation ? ?Date/Time:  Friday October 29 2021 13:09:00 EDT ?Ventricular Rate:  101 ?PR Interval:  148 ?QRS Duration: 87 ?QT Interval:  315 ?QTC Calculation: 409 ?R Axis:   -7 ?Text Interpretation: Sinus tachycardia No significant change since last tracing Confirmed by Blanchie Dessert [phone]) on 10/29/2021 1:42:35  PM ? ?Radiology ?DG Chest 2 View ? ?Result Date: 10/29/2021 ?CLINICAL DATA:  Cough, congestion. Additional history provided: Cough, congestion, sore throat, wheezing. Flu positive on Sunday. EXAM: CHEST - 2 VIEW COMPARISON:

## 2021-10-29 NOTE — Assessment & Plan Note (Signed)
Suspect hypovolemia, dry mucous membranes ?Follow with IVF ?

## 2021-10-29 NOTE — ED Notes (Signed)
Phone Handoff Report provided to Donna-RN at [facility] ?

## 2021-10-29 NOTE — ED Triage Notes (Addendum)
Pt c/o cough, congestion ,sorethroat wheezing,  was sent here from urgent care   tested pos for flu sunday ?

## 2021-10-29 NOTE — Plan of Care (Signed)

## 2021-10-29 NOTE — Plan of Care (Signed)
[place]: Designer, multimedia ?Requesting Physician/APP: Shirlee Limerick, PA ? ?History: ?Christine Hart is a 76 year old female with past medical history significant for anxiety/depression, essential hypertension who presented to [facility] with progressive fever, cough, malaise, and congestion.  Patient also endorses headache and myalgias.  Onset roughly 12 days ago.  Was seen in the ED and urgent care on 10/24/2021.  Her PCP started her on cefdinir for possible pneumonia 2 days ago.  Patient tested positive for influenza A on 10/26/2021. ? ?In the ED, temperature 102.3, HR 110, RR 18, BP 157/83, SPO2 95% on room air.  WBC count 21.3, sodium 129, creatinine 0.64.  Lactic acid 1.6.  Chest x-ray reported as no acute cardiopulmonary disease process, but in comparison to previous x-ray taken on 10/24/2021, increased opacification noted bilateral lower lung fields, greatest on right.  Given patient's poor control of symptoms with progression despite oral antibiotics outpatient, ED PA requested admission for further evaluation and treatment of influenza A with concern of superimposed pneumonia.  Blood cultures x2 and urinalysis/urine culture ordered.  Started on azithromycin and ceftriaxone. ? ?Plan of Care:  ?--Excepted to medical telemetry bed at [facility] or [facility], first available ? ? ?TRH will assume care on arrival to accepting facility. Until arrival, care as per EDP. However, TRH available 24/7 for questions and assistance. ?  ?Please page [place] and Consults [phone]) as soon as the patient arrives to the hospital.  ? ?Christine Hart [place] DO ? ?

## 2021-10-29 NOTE — Assessment & Plan Note (Signed)
xanax

## 2021-10-29 NOTE — ED Notes (Signed)
ED Provider at bedside. 

## 2021-10-30 ENCOUNTER — Encounter (HOSPITAL_COMMUNITY): Payer: Self-pay | Admitting: Family Medicine

## 2021-10-30 DIAGNOSIS — R509 Fever, unspecified: Secondary | ICD-10-CM | POA: Diagnosis not present

## 2021-10-30 DIAGNOSIS — A419 Sepsis, unspecified organism: Secondary | ICD-10-CM | POA: Diagnosis not present

## 2021-10-30 LAB — CBC
HCT: 35.4 % — ABNORMAL LOW (ref 36.0–46.0)
Hemoglobin: 11.6 g/dL — ABNORMAL LOW (ref 12.0–15.0)
MCH: 29.6 pg (ref 26.0–34.0)
MCHC: 32.8 g/dL (ref 30.0–36.0)
MCV: 90.3 fL (ref 80.0–100.0)
Platelets: 312 10*3/uL (ref 150–400)
RBC: 3.92 MIL/uL (ref 3.87–5.11)
RDW: 12.8 % (ref 11.5–15.5)
WBC: 15.2 10*3/uL — ABNORMAL HIGH (ref 4.0–10.5)
nRBC: 0 % (ref 0.0–0.2)

## 2021-10-30 LAB — COMPREHENSIVE METABOLIC PANEL
ALT: 14 U/L (ref 0–44)
AST: 16 U/L (ref 15–41)
Albumin: 3.1 g/dL — ABNORMAL LOW (ref 3.5–5.0)
Alkaline Phosphatase: 57 U/L (ref 38–126)
Anion gap: 8 (ref 5–15)
BUN: 6 mg/dL — ABNORMAL LOW (ref 8–23)
CO2: 26 mmol/L (ref 22–32)
Calcium: 8.8 mg/dL — ABNORMAL LOW (ref 8.9–10.3)
Chloride: 106 mmol/L (ref 98–111)
Creatinine, Ser: 0.48 mg/dL (ref 0.44–1.00)
GFR, Estimated: >60 mL/min (ref >60)
Glucose, Bld: 98 mg/dL (ref 70–99)
Potassium: 3.5 mmol/L (ref 3.5–5.1)
Sodium: 140 mmol/L (ref 135–145)
Total Bilirubin: 0.6 mg/dL (ref 0.3–1.2)
Total Protein: 7 g/dL (ref 6.5–8.1)

## 2021-10-30 LAB — RESPIRATORY PANEL BY PCR

## 2021-10-30 LAB — STREP PNEUMONIAE URINARY ANTIGEN: Strep Pneumo Urinary Antigen: NEGATIVE

## 2021-10-30 LAB — HIV ANTIBODY (ROUTINE TESTING W REFLEX): HIV Screen 4th Generation wRfx: NONREACTIVE

## 2021-10-30 MED ORDER — AZITHROMYCIN 250 MG PO TABS: 250.0000 mg | ORAL_TABLET | Freq: Every day | ORAL | 0 refills | Status: DC

## 2021-10-30 MED ORDER — HYDROCOD POLST-CPM POLST ER 10-8 MG/5ML PO SUER: 5.0000 mL | Freq: Three times a day (TID) | ORAL | 0 refills | Status: DC | PRN

## 2021-10-30 MED ORDER — BENZONATATE 200 MG PO CAPS: 200.0000 mg | ORAL_CAPSULE | Freq: Three times a day (TID) | ORAL | 0 refills | Status: DC | PRN

## 2021-10-30 NOTE — Plan of Care (Signed)

## 2021-10-30 NOTE — TOC Transition Note (Signed)
Transition of Care (TOC) - CM/SW Discharge Note ? ? ?Patient Details  ?Name: Christine Hart ?MRN: [id number] ?Date of Birth: Dec 17, 1945 ? ?Transition of Care (TOC) CM/SW Contact:  ?Tawanna Cooler, RN ?Phone Number: ?10/30/2021, 1:06 PM ? ? ?Clinical Narrative:    ? ?Was notified this would be a code 34.  Tried to call patient in the room, but no answer.  Went to patient's room, and the room is in the process of being cleaned.  Patient already left.  ? ? ?  ? ? ? ? ?

## 2021-10-30 NOTE — Plan of Care (Signed)
?  Problem: Education: ?Goal: Knowledge of General Education information will improve ?Description: Including pain rating scale, medication(s)/side effects and non-pharmacologic comfort measures ?10/30/2021 1135 by Freddy Finner, RN ?Outcome: Adequate for Discharge ?10/30/2021 1135 by Freddy Finner, RN ?Outcome: Progressing ?  ?Problem: Health Behavior/Discharge Planning: ?Goal: Ability to manage health-related needs will improve ?10/30/2021 1135 by Freddy Finner, RN ?Outcome: Adequate for Discharge ?10/30/2021 1135 by Freddy Finner, RN ?Outcome: Progressing ?  ?Problem: Clinical Measurements: ?Goal: Ability to maintain clinical measurements within normal limits will improve ?10/30/2021 1135 by Freddy Finner, RN ?Outcome: Adequate for Discharge ?10/30/2021 1135 by Freddy Finner, RN ?Outcome: Progressing ?Goal: Will remain free from infection ?10/30/2021 1135 by Freddy Finner, RN ?Outcome: Adequate for Discharge ?10/30/2021 1135 by Freddy Finner, RN ?Outcome: Progressing ?Goal: Diagnostic test results will improve ?10/30/2021 1135 by Freddy Finner, RN ?Outcome: Adequate for Discharge ?10/30/2021 1135 by Freddy Finner, RN ?Outcome: Progressing ?Goal: Respiratory complications will improve ?10/30/2021 1135 by Freddy Finner, RN ?Outcome: Adequate for Discharge ?10/30/2021 1135 by Freddy Finner, RN ?Outcome: Progressing ?Goal: Cardiovascular complication will be avoided ?10/30/2021 1135 by Freddy Finner, RN ?Outcome: Adequate for Discharge ?10/30/2021 1135 by Freddy Finner, RN ?Outcome: Progressing ?  ?Problem: Activity: ?Goal: Risk for activity intolerance will decrease ?10/30/2021 1135 by Freddy Finner, RN ?Outcome: Adequate for Discharge ?10/30/2021 1135 by Freddy Finner, RN ?Outcome: Progressing ?  ?Problem: Nutrition: ?Goal: Adequate nutrition will be maintained ?10/30/2021 1135 by Freddy Finner, RN ?Outcome: Adequate for Discharge ?10/30/2021 1135 by Freddy Finner, RN ?Outcome: Progressing ?   ?Problem: Coping: ?Goal: Level of anxiety will decrease ?10/30/2021 1135 by Freddy Finner, RN ?Outcome: Adequate for Discharge ?10/30/2021 1135 by Freddy Finner, RN ?Outcome: Progressing ?  ?Problem: Elimination: ?Goal: Will not experience complications related to bowel motility ?10/30/2021 1135 by Freddy Finner, RN ?Outcome: Adequate for Discharge ?10/30/2021 1135 by Freddy Finner, RN ?Outcome: Progressing ?Goal: Will not experience complications related to urinary retention ?10/30/2021 1135 by Freddy Finner, RN ?Outcome: Adequate for Discharge ?10/30/2021 1135 by Freddy Finner, RN ?Outcome: Progressing ?  ?Problem: Pain Managment: ?Goal: General experience of comfort will improve ?10/30/2021 1135 by Freddy Finner, RN ?Outcome: Adequate for Discharge ?10/30/2021 1135 by Freddy Finner, RN ?Outcome: Progressing ?  ?Problem: Safety: ?Goal: Ability to remain free from injury will improve ?10/30/2021 1135 by Freddy Finner, RN ?Outcome: Adequate for Discharge ?10/30/2021 1135 by Freddy Finner, RN ?Outcome: Progressing ?  ?Problem: Skin Integrity: ?Goal: Risk for impaired skin integrity will decrease ?10/30/2021 1135 by Freddy Finner, RN ?Outcome: Adequate for Discharge ?10/30/2021 1135 by Freddy Finner, RN ?Outcome: Progressing ?  ?

## 2021-10-30 NOTE — Progress Notes (Signed)
Error see d/c summary ?

## 2021-10-31 LAB — URINE CULTURE: Culture: >=10000 — AB

## 2021-10-31 NOTE — Discharge Summary (Signed)
Physician Discharge Summary  ?Christine Hart VCB:[id number] DOB: [birth year] DOA: 10/29/2021 ? ?PCP: Street, Sharon [place], MD ? ?Admit date: 10/29/2021 ?Discharge date: 10/30/2021 ? ?Admitted From: home ?Disposition: home ? ?Recommendations for Outpatient Follow-up:  ?Follow up with PCP in 1-2 weeks ? ? ?Home Health:none  ?Equipment/Devices:none ? ?Discharge Condition:good  ?CODE STATUS:FULL  ?Diet recommendation: Heart Healthy ? ? ? ? ?Admission Diagnosis: ?Sepsis d/t CAP ? ?Discharge Diagnoses:  ?Principal Problem: ?  Sepsis ([place]) d/t likely viral pneumonia  ?Active Problems: ?  Hypertension ?  Anxiety ?  Hyponatremia ? ? ? ? ?HOSPITAL COURSE: ?Christine Hart is a 76 y.o. female with medical history significant of HTN, MVP who presented to [facility] ED with 11 days of infectious symptoms which have worsened despite outpatient abx therapy. Seen in ED on 4/2 and discharged with conservative measures, subsequently treated with cefdinir by PCP, sent here from urgent care ?10/29/2021: to ED, CXR at [facility] was without acute cardiopulm abnormality, but due to fevers, leukocytosis, and concern for pneumonia she was transferred to [facility] for additional management. CT chest Consolidation in the medial left lower lobe with areas of patchy atelectasis in the right middle and lower lobes. No sizable effusion is noted. No evidence of pulmonary emboli. She was continued abx w/ cefriaxone/azithromycin.  ?Today 10/30/21: Resp PCR (+)Rhinovirus. Afebrile. SpO2 WNL on RA.  Vital signs otherwise stable, somewhat hypertensive.  Patient reports feeling dramatically better compared to when she presented to the ED, she requests to go home and given resolution of sepsis and confirmation of viral cause of symptoms, patient was cleared to go home.  Given concern for possible COPD exacerbation/chest x-ray findings, sent home on steroids, azithromycin. ? ?Procedures and Significant Results:  ?none ? ?Consultants:  ?none ? ? ? ?SUBJECTIVE:  ?Patient seen and  examined in bed, alert, pleasant, no respiratory distress, speaking in full sentences.  Patient is requesting to go home if possible ? ?Objective: ?Vital signs in last 24 hours: ?Temp:  [98 ?F (36.7 ?C)-102.3 ?F (39.1 ?C)] 99.2 ?F (37.3 ?C) (04/08 3846) ?Pulse Rate:  [82-110] 90 (04/08 0626) ?Resp:  [14-22] 17 (04/08 6599) ?BP: (137-177)/(62-90) 162/74 (04/08 3570) ?SpO2:  [91 %-97 %] 95 % (04/08 0626) ?Weight:  [70.3 kg-71.2 kg] 71.2 kg (04/08 0500) ?Weight change:  ?Last BM Date : 10/28/21 ? ?Intake/Output from previous day: ?04/07 0701 - 04/08 0700 ?In: 1015.7 [P.O.:120; I.V.:645.7; IV Piggyback:250] ?Out: 400 [Urine:400] ?Intake/Output this shift: ?No intake/output data recorded. ? ?Lab Results: ?Recent Labs  ?  10/29/21 ?1303 10/30/21 ?0428  ?WBC 21.3* 15.2*  ?HGB 13.0 11.6*  ?HCT 39.9 35.4*  ?PLT 439* 312  ? ?BMET ?Recent Labs  ?  10/29/21 ?1303 10/30/21 ?0428  ?NA 129* 140  ?K 3.5 3.5  ?CL 95* 106  ?CO2 23 26  ?GLUCOSE 101* 98  ?BUN 8 6*  ?CREATININE 0.64 0.48  ?CALCIUM 8.9 8.8*  ? ? ? ? ? ?Discharge Instructions ? ?Discharge Instructions   ? ? Call MD for:  difficulty breathing, headache or visual disturbances   Complete by: As directed ?  ? Call MD for:  persistant dizziness or light-headedness   Complete by: As directed ?  ? Call MD for:  persistant nausea and vomiting   Complete by: As directed ?  ? Call MD for:  temperature >100.4   Complete by: As directed ?  ? Diet - low sodium heart healthy   Complete by: As directed ?  ? Discharge instructions   Complete by: As directed ?  ?  CALL YOUR PCP FIRST THING ON Monday TO [place]. PLEASE RETURN TO ER IF YOU ARE FEELING WORSE. FINISH ALL ANTIBIOTICS. USE COUGH MEDICINE AS NEEDED. TRY TO GET UP AND WALK AS ABLE.  ? Increase activity slowly   Complete by: As directed ?  ? ?  ? ?Allergies as of 10/30/2021   ?No Known Allergies ?  ? ?  ?Medication List  ?  ? ?STOP taking these medications   ? ?cefdinir 300 MG capsule ?Commonly known as:  OMNICEF ?  ? ?  ? ?TAKE these medications   ? ?acetaminophen 500 MG tablet ?Commonly known as: TYLENOL ?Take 1,000 mg by mouth every 6 (six) hours as needed for mild pain, fever or headache. ?  ?ALPRAZolam 0.5 MG tablet ?Commonly known as: Duanne Moron ?Take 0.5 mg by mouth 3 (three) times daily as needed for anxiety. ?  ?amLODipine 5 MG tablet ?Commonly known as: NORVASC ?Take 5 mg by mouth daily. ?  ?azithromycin 250 MG tablet ?Commonly known as: ZITHROMAX ?Take 1 tablet (250 mg total) by mouth daily. ?  ?benzonatate 200 MG capsule ?Commonly known as: TESSALON ?Take 1 capsule (200 mg total) by mouth 3 (three) times daily as needed for cough. ?  ?betamethasone dipropionate 6.43 % cream ?1 application. 2 (two) times daily as needed (rash). ?  ?chlorpheniramine-HYDROcodone 10-8 MG/5ML Suer ?Commonly known as: [place] ?Take 5 mLs by mouth every 8 (eight) hours as needed for cough. ?What changed: when to take this ?  ?cyclobenzaprine 10 MG tablet ?Commonly known as: FLEXERIL ?Take 1 tablet (10 mg total) by mouth 3 (three) times daily as needed for muscle spasms. ?What changed: when to take this ?  ?metoprolol succinate 50 MG 24 hr tablet ?Commonly known as: TOPROL-XL ?Take 50 mg by mouth daily. ?  ?multivitamin with minerals Tabs tablet ?Take 1 tablet by mouth daily. ?  ?telmisartan 40 MG tablet ?Commonly known as: MICARDIS ?Take 40 mg by mouth daily. ?  ?traMADol 50 MG tablet ?Commonly known as: ULTRAM ?Take 50 mg by mouth 3 (three) times daily as needed for moderate pain. ?  ? ?  ? ? ? ? ? ? ?Procedures/Studies: ?DG Chest 2 View ? ?Result Date: 10/29/2021 ?CLINICAL DATA:  Cough, congestion. Additional history provided: Cough, congestion, sore throat, wheezing. Flu positive on Sunday. EXAM: CHEST - 2 VIEW COMPARISON:  Prior chest radiographs 10/24/2021 and earlier. FINDINGS: Heart size within normal limits. No appreciable airspace consolidation. No evidence of pleural effusion or pneumothorax. No acute bony abnormality  identified. ACDF hardware. IMPRESSION: No evidence of acute cardiopulmonary abnormality. Electronically Signed   By: Kellie Simmering D.O.   On: 10/29/2021 13:29  ? ?DG Chest 2 View ? ?Result Date: 10/24/2021 ?CLINICAL DATA:  76 year old female with cough congestion fever right eye drainage and redness. EXAM: CHEST - 2 VIEW COMPARISON:  Chest radiographs 05/26/2021 and earlier. FINDINGS: Chronic right axillary and chest wall surgical clips. Chronic ACDF. Lung volumes and mediastinal contours are stable from last year, with mild cardiomegaly and evidence of some right heart enlargement on the lateral view. Visualized tracheal air column is within normal limits. Both lungs appear clear. No pneumothorax or pleural effusion. No acute osseous abnormality identified. Negative visible bowel gas. IMPRESSION: Stable cardiomegaly. No acute cardiopulmonary abnormality. Electronically Signed   By: Genevie Ann M.D.   On: 10/24/2021 10:15  ? ?CT Angio Chest Pulmonary Embolism (PE) W or WO Contrast ? ?Result Date: 10/29/2021 ?CLINICAL DATA:  Shortness of breath and chest pain  for several days EXAM: CT ANGIOGRAPHY CHEST WITH CONTRAST TECHNIQUE: Multidetector CT imaging of the chest was performed using the standard protocol during bolus administration of intravenous contrast. Multiplanar CT image reconstructions and MIPs were obtained to evaluate the vascular anatomy. RADIATION DOSE REDUCTION: This exam was performed according to the departmental dose-optimization program which includes automated exposure control, adjustment of the mA and/or kV according to patient size and/or use of iterative reconstruction technique. CONTRAST:  34m OMNIPAQUE IOHEXOL 350 MG/ML SOLN COMPARISON:  Chest x-ray from earlier in the same day. FINDINGS: Cardiovascular: Thoracic aorta is well visualized with atherosclerotic calcifications. No aneurysmal dilatation or dissection is noted. Pulmonary artery shows a normal branching pattern without intraluminal filling  defect to suggest pulmonary embolism. Mild coronary calcifications are seen. Mediastinum/Nodes: Thoracic inlet is within normal limits. No sizable hilar or mediastinal adenopathy is noted. The esophagus as visualize

## 2021-11-01 LAB — LEGIONELLA PNEUMOPHILA SEROGP 1 UR AG: L. pneumophila Serogp 1 Ur Ag: NEGATIVE

## 2021-11-03 LAB — CULTURE, BLOOD (ROUTINE X 2)
Culture: NO GROWTH
Culture: NO GROWTH
Special Requests: ADEQUATE
Special Requests: ADEQUATE

## 2021-11-10 DIAGNOSIS — I10 Essential (primary) hypertension: Secondary | ICD-10-CM | POA: Diagnosis not present

## 2021-11-10 DIAGNOSIS — Z6826 Body mass index (BMI) 26.0-26.9, adult: Secondary | ICD-10-CM | POA: Diagnosis not present

## 2021-11-10 DIAGNOSIS — I471 Supraventricular tachycardia: Secondary | ICD-10-CM | POA: Diagnosis not present

## 2021-11-10 DIAGNOSIS — J1289 Other viral pneumonia: Secondary | ICD-10-CM | POA: Diagnosis not present

## 2021-11-10 DIAGNOSIS — E663 Overweight: Secondary | ICD-10-CM | POA: Diagnosis not present

## 2021-12-22 DIAGNOSIS — H2511 Age-related nuclear cataract, right eye: Secondary | ICD-10-CM | POA: Diagnosis not present

## 2021-12-29 DIAGNOSIS — M94262 Chondromalacia, left knee: Secondary | ICD-10-CM | POA: Diagnosis not present

## 2021-12-29 DIAGNOSIS — S83231A Complex tear of medial meniscus, current injury, right knee, initial encounter: Secondary | ICD-10-CM | POA: Diagnosis not present

## 2021-12-29 DIAGNOSIS — X58XXXA Exposure to other specified factors, initial encounter: Secondary | ICD-10-CM | POA: Diagnosis not present

## 2021-12-29 DIAGNOSIS — M65861 Other synovitis and tenosynovitis, right lower leg: Secondary | ICD-10-CM | POA: Diagnosis not present

## 2021-12-29 DIAGNOSIS — M659 Synovitis and tenosynovitis, unspecified: Secondary | ICD-10-CM | POA: Diagnosis not present

## 2021-12-29 DIAGNOSIS — G8918 Other acute postprocedural pain: Secondary | ICD-10-CM | POA: Diagnosis not present

## 2021-12-29 DIAGNOSIS — M6751 Plica syndrome, right knee: Secondary | ICD-10-CM | POA: Diagnosis not present

## 2021-12-29 DIAGNOSIS — M6752 Plica syndrome, left knee: Secondary | ICD-10-CM | POA: Diagnosis not present

## 2021-12-29 DIAGNOSIS — Y999 Unspecified external cause status: Secondary | ICD-10-CM | POA: Diagnosis not present

## 2021-12-29 DIAGNOSIS — S83232A Complex tear of medial meniscus, current injury, left knee, initial encounter: Secondary | ICD-10-CM | POA: Diagnosis not present

## 2021-12-29 DIAGNOSIS — M2241 Chondromalacia patellae, right knee: Secondary | ICD-10-CM | POA: Diagnosis not present

## 2022-01-05 DIAGNOSIS — M25561 Pain in right knee: Secondary | ICD-10-CM | POA: Diagnosis not present

## 2022-01-12 DIAGNOSIS — M25561 Pain in right knee: Secondary | ICD-10-CM | POA: Diagnosis not present

## 2022-01-14 DIAGNOSIS — M25561 Pain in right knee: Secondary | ICD-10-CM | POA: Diagnosis not present

## 2022-01-18 DIAGNOSIS — M47812 Spondylosis without myelopathy or radiculopathy, cervical region: Secondary | ICD-10-CM | POA: Diagnosis not present

## 2022-01-18 DIAGNOSIS — Z981 Arthrodesis status: Secondary | ICD-10-CM | POA: Diagnosis not present

## 2022-01-19 DIAGNOSIS — L820 Inflamed seborrheic keratosis: Secondary | ICD-10-CM | POA: Diagnosis not present

## 2022-01-19 DIAGNOSIS — D485 Neoplasm of uncertain behavior of skin: Secondary | ICD-10-CM | POA: Diagnosis not present

## 2022-01-19 DIAGNOSIS — I8310 Varicose veins of unspecified lower extremity with inflammation: Secondary | ICD-10-CM | POA: Diagnosis not present

## 2022-01-20 DIAGNOSIS — M25561 Pain in right knee: Secondary | ICD-10-CM | POA: Diagnosis not present

## 2022-01-26 DIAGNOSIS — M25561 Pain in right knee: Secondary | ICD-10-CM | POA: Diagnosis not present

## 2022-02-09 DIAGNOSIS — E663 Overweight: Secondary | ICD-10-CM | POA: Diagnosis not present

## 2022-02-09 DIAGNOSIS — M1991 Primary osteoarthritis, unspecified site: Secondary | ICD-10-CM | POA: Diagnosis not present

## 2022-02-09 DIAGNOSIS — F331 Major depressive disorder, recurrent, moderate: Secondary | ICD-10-CM | POA: Diagnosis not present

## 2022-02-09 DIAGNOSIS — Z6826 Body mass index (BMI) 26.0-26.9, adult: Secondary | ICD-10-CM | POA: Diagnosis not present

## 2022-02-17 DIAGNOSIS — C44321 Squamous cell carcinoma of skin of nose: Secondary | ICD-10-CM | POA: Diagnosis not present

## 2022-02-17 DIAGNOSIS — C44722 Squamous cell carcinoma of skin of right lower limb, including hip: Secondary | ICD-10-CM | POA: Diagnosis not present

## 2022-04-20 DIAGNOSIS — Z1231 Encounter for screening mammogram for malignant neoplasm of breast: Secondary | ICD-10-CM | POA: Diagnosis not present

## 2022-04-20 DIAGNOSIS — Z01419 Encounter for gynecological examination (general) (routine) without abnormal findings: Secondary | ICD-10-CM | POA: Diagnosis not present

## 2022-06-23 DIAGNOSIS — L820 Inflamed seborrheic keratosis: Secondary | ICD-10-CM | POA: Diagnosis not present

## 2022-06-23 DIAGNOSIS — C44722 Squamous cell carcinoma of skin of right lower limb, including hip: Secondary | ICD-10-CM | POA: Diagnosis not present

## 2022-06-23 DIAGNOSIS — C44321 Squamous cell carcinoma of skin of nose: Secondary | ICD-10-CM | POA: Diagnosis not present

## 2022-08-11 DIAGNOSIS — Z6825 Body mass index (BMI) 25.0-25.9, adult: Secondary | ICD-10-CM | POA: Diagnosis not present

## 2022-08-11 DIAGNOSIS — M5432 Sciatica, left side: Secondary | ICD-10-CM | POA: Diagnosis not present

## 2022-08-11 DIAGNOSIS — M5412 Radiculopathy, cervical region: Secondary | ICD-10-CM | POA: Diagnosis not present

## 2022-08-31 DIAGNOSIS — M4316 Spondylolisthesis, lumbar region: Secondary | ICD-10-CM | POA: Diagnosis not present

## 2022-08-31 DIAGNOSIS — M5136 Other intervertebral disc degeneration, lumbar region: Secondary | ICD-10-CM | POA: Diagnosis not present

## 2022-08-31 DIAGNOSIS — M47816 Spondylosis without myelopathy or radiculopathy, lumbar region: Secondary | ICD-10-CM | POA: Diagnosis not present

## 2022-08-31 DIAGNOSIS — M4807 Spinal stenosis, lumbosacral region: Secondary | ICD-10-CM | POA: Diagnosis not present

## 2022-08-31 DIAGNOSIS — M5126 Other intervertebral disc displacement, lumbar region: Secondary | ICD-10-CM | POA: Diagnosis not present

## 2022-09-14 DIAGNOSIS — M542 Cervicalgia: Secondary | ICD-10-CM | POA: Diagnosis not present

## 2022-09-14 DIAGNOSIS — E559 Vitamin D deficiency, unspecified: Secondary | ICD-10-CM | POA: Diagnosis not present

## 2022-09-14 DIAGNOSIS — M4727 Other spondylosis with radiculopathy, lumbosacral region: Secondary | ICD-10-CM | POA: Diagnosis not present

## 2022-09-14 DIAGNOSIS — K582 Mixed irritable bowel syndrome: Secondary | ICD-10-CM | POA: Diagnosis not present

## 2022-09-14 DIAGNOSIS — Z131 Encounter for screening for diabetes mellitus: Secondary | ICD-10-CM | POA: Diagnosis not present

## 2022-09-14 DIAGNOSIS — E782 Mixed hyperlipidemia: Secondary | ICD-10-CM | POA: Diagnosis not present

## 2022-09-14 DIAGNOSIS — Z0000 Encounter for general adult medical examination without abnormal findings: Secondary | ICD-10-CM | POA: Diagnosis not present

## 2022-09-14 DIAGNOSIS — M8589 Other specified disorders of bone density and structure, multiple sites: Secondary | ICD-10-CM | POA: Diagnosis not present

## 2022-09-14 DIAGNOSIS — M1991 Primary osteoarthritis, unspecified site: Secondary | ICD-10-CM | POA: Diagnosis not present

## 2022-09-14 DIAGNOSIS — I10 Essential (primary) hypertension: Secondary | ICD-10-CM | POA: Diagnosis not present

## 2022-09-14 DIAGNOSIS — M8580 Other specified disorders of bone density and structure, unspecified site: Secondary | ICD-10-CM | POA: Diagnosis not present

## 2022-09-23 DIAGNOSIS — M4316 Spondylolisthesis, lumbar region: Secondary | ICD-10-CM | POA: Diagnosis not present

## 2022-09-23 DIAGNOSIS — M48062 Spinal stenosis, lumbar region with neurogenic claudication: Secondary | ICD-10-CM | POA: Diagnosis not present

## 2022-11-09 ENCOUNTER — Other Ambulatory Visit: Payer: Self-pay | Admitting: Neurosurgery

## 2022-11-17 ENCOUNTER — Other Ambulatory Visit: Payer: Self-pay | Admitting: Neurosurgery

## 2022-11-30 NOTE — Pre-Procedure Instructions (Signed)
Surgical Instructions    Your procedure is scheduled on Wednesday, May 15th.  Report to [facility] Main Entrance "A" at 08:45 A.M., then check in with the Admitting office.  Call this number if you have problems the morning of surgery:  [phone]  If you have any questions prior to your surgery date call [phone]: Open Monday-Friday 8am-4pm If you experience any cold or flu symptoms such as cough, fever, chills, shortness of breath, etc. between now and your scheduled surgery, please notify us at the above number.     Remember:  Do not eat or drink after midnight the night before your surgery     Take these medicines the morning of surgery with A SIP OF WATER  metoprolol succinate (TOPROL-XL)   If needed: ALPRAZolam Prudy Feeler)  cyclobenzaprine (FLEXERIL)  traMADol (ULTRAM)    As of today, STOP taking any Aspirin (unless otherwise instructed by your surgeon) Aleve, Naproxen, Ibuprofen, Motrin, Advil, Goody's, BC's, all herbal medications, fish oil, and all vitamins.                     Do NOT Smoke (Tobacco/Vaping) for 24 hours prior to your procedure.  If you use a CPAP at night, you may bring your mask/headgear for your overnight stay.   Contacts, glasses, piercing's, hearing aid's, dentures or partials may not be worn into surgery, please bring cases for these belongings.    For patients admitted to the hospital, discharge time will be determined by your treatment team.   Patients discharged the day of surgery will not be allowed to drive home, and someone needs to stay with them for 24 hours.  SURGICAL WAITING ROOM VISITATION Patients having surgery or a procedure may have no more than 2 support people in the waiting area - these visitors may rotate.   Children under the age of 64 must have an adult with them who is not the patient. If the patient needs to stay at the hospital during part of their recovery, the visitor guidelines for inpatient rooms apply. Pre-op  nurse will coordinate an appropriate time for 1 support person to accompany patient in pre-op.  This support person may not rotate.   Please refer to the [facility] website for the visitor guidelines for Inpatients (after your surgery is over and you are in a regular room).      Day of Surgery: Take a shower with CHG soap. Do not wear jewelry or makeup Do not wear lotions, powders, perfumes, or deodorant. Do not bring valuables to the hospital. [facility] is not responsible for any belongings or valuables. Do not wear nail polish, gel polish, artificial nails, or any other type of covering on natural nails (fingers and toes) If you have artificial nails or gel coating that need to be removed by a nail salon, please have this removed prior to surgery. Artificial nails or gel coating may interfere with anesthesia's ability to adequately monitor your vital signs. Wear Clean/Comfortable clothing the morning of surgery Remember to brush your teeth WITH YOUR REGULAR TOOTHPASTE.   Please read over the following fact sheets that you were given.    If you received a COVID test during your pre-op visit  it is requested that you wear a mask when out in public, stay away from anyone that may not be feeling well and notify your surgeon if you develop symptoms. If you have been in contact with anyone that has tested positive in the last 10 days please  notify you surgeon.

## 2022-12-01 ENCOUNTER — Encounter (HOSPITAL_COMMUNITY): Payer: Self-pay

## 2022-12-01 ENCOUNTER — Encounter (HOSPITAL_COMMUNITY)
Admission: RE | Admit: 2022-12-01 | Discharge: 2022-12-01 | Disposition: A | Discharge status: Home / Self Care | Payer: PPO | Source: Ambulatory Visit | Attending: Neurosurgery | Admitting: Neurosurgery

## 2022-12-01 ENCOUNTER — Other Ambulatory Visit: Payer: Self-pay

## 2022-12-01 VITALS — BP 201/76 | HR 66 | Temp 97.9°F | Resp 18 | Ht 66.0 in | Wt 166.6 lb

## 2022-12-01 DIAGNOSIS — I10 Essential (primary) hypertension: Secondary | ICD-10-CM | POA: Insufficient documentation

## 2022-12-01 DIAGNOSIS — M4316 Spondylolisthesis, lumbar region: Secondary | ICD-10-CM | POA: Insufficient documentation

## 2022-12-01 DIAGNOSIS — Z01818 Encounter for other preprocedural examination: Secondary | ICD-10-CM | POA: Diagnosis not present

## 2022-12-01 DIAGNOSIS — M5137 Other intervertebral disc degeneration, lumbosacral region: Secondary | ICD-10-CM | POA: Insufficient documentation

## 2022-12-01 DIAGNOSIS — F419 Anxiety disorder, unspecified: Secondary | ICD-10-CM | POA: Diagnosis not present

## 2022-12-01 DIAGNOSIS — I341 Nonrheumatic mitral (valve) prolapse: Secondary | ICD-10-CM | POA: Diagnosis not present

## 2022-12-01 DIAGNOSIS — Z981 Arthrodesis status: Secondary | ICD-10-CM | POA: Diagnosis not present

## 2022-12-01 DIAGNOSIS — I491 Atrial premature depolarization: Secondary | ICD-10-CM | POA: Insufficient documentation

## 2022-12-01 DIAGNOSIS — Z85828 Personal history of other malignant neoplasm of skin: Secondary | ICD-10-CM | POA: Insufficient documentation

## 2022-12-01 HISTORY — DX: Anxiety disorder, unspecified: F41.9

## 2022-12-01 HISTORY — DX: Malignant (primary) neoplasm, unspecified: C80.1

## 2022-12-01 LAB — SURGICAL PCR SCREEN
MRSA, PCR: NEGATIVE
Staphylococcus aureus: NEGATIVE

## 2022-12-01 LAB — BASIC METABOLIC PANEL
Anion gap: 8 (ref 5–15)
BUN: 14 mg/dL (ref 8–23)
CO2: 27 mmol/L (ref 22–32)
Calcium: 9 mg/dL (ref 8.9–10.3)
Chloride: 103 mmol/L (ref 98–111)
Creatinine, Ser: 0.82 mg/dL (ref 0.44–1.00)
GFR, Estimated: >60 mL/min (ref >60)
Glucose, Bld: 94 mg/dL (ref 70–99)
Potassium: 4.2 mmol/L (ref 3.5–5.1)
Sodium: 138 mmol/L (ref 135–145)

## 2022-12-01 LAB — TYPE AND SCREEN
ABO/RH(D): A POS
Antibody Screen: NEGATIVE

## 2022-12-01 LAB — CBC
HCT: 41.6 % (ref 36.0–46.0)
Hemoglobin: 13.9 g/dL (ref 12.0–15.0)
MCH: 31.2 pg (ref 26.0–34.0)
MCHC: 33.4 g/dL (ref 30.0–36.0)
MCV: 93.5 fL (ref 80.0–100.0)
Platelets: 218 10*3/uL (ref 150–400)
RBC: 4.45 MIL/uL (ref 3.87–5.11)
RDW: 12.4 % (ref 11.5–15.5)
WBC: 7.2 10*3/uL (ref 4.0–10.5)
nRBC: 0 % (ref 0.0–0.2)

## 2022-12-01 NOTE — Progress Notes (Addendum)
PCP - Dr. Maryjean Ka Cardiologist - denies  PPM/ICD - n/a  Chest x-ray - n/a EKG - 12/01/22 Stress Test - 20+ years ago ECHO - denies Cardiac Cath - 30+ years ago  Sleep Study - denies CPAP - denies  Last dose of GLP1 agonist-  n/a GLP1 instructions: n/a  Blood Thinner Instructions: n/a Aspirin Instructions: n/a  ERAS Protcol -n/a PRE-SURGERY Ensure or G2- n/a  COVID TEST- n/a  Anesthesia review: Yes, EKG review and elevated BP at PAT. Initial readings: 201/76-right arm, 198/105-left arm. Pt stated she took both BP meds this morning, metoprolol and telmisartan. Repeat reading at the end of visit: 199/82-right arm, 196/85-left arm. Shonna Chock, PA with anesthesia made aware. Pt instructed to f/u with PCP for pain control or BP medication adjustments. Pt verbalized understanding and will contact PCP today.  Patient denies shortness of breath, fever, cough and chest pain at PAT appointment   All instructions explained to the patient, with a verbal understanding of the material. Patient agrees to go over the instructions while at home for a better understanding. Patient also instructed to self quarantine after being tested for COVID-19. The opportunity to ask questions was provided.

## 2022-12-02 DIAGNOSIS — M4727 Other spondylosis with radiculopathy, lumbosacral region: Secondary | ICD-10-CM | POA: Diagnosis not present

## 2022-12-02 DIAGNOSIS — I10 Essential (primary) hypertension: Secondary | ICD-10-CM | POA: Diagnosis not present

## 2022-12-02 NOTE — Progress Notes (Signed)
Anesthesia Chart Review:  Case: [id number] Date/Time: 12/07/22 1030   Procedure: PLIF,IP,POSTERIOR INSTRUMENTATION, L45 - 3C   Anesthesia type: General   Pre-op diagnosis: SPONDYLOLISTHESIS, LUMBAR REGION   [facility]: [facility] ROOM 21 / [facility] OR   Surgeons: Tressie Stalker, MD       DISCUSSION: Patient is a 77 year old female scheduled for the above procedure.  History includes never smoker, post-operative N/V, HTN, MVP, skin cancer (BCC; , SCC excision 2023), anxiety, spinal surgery (C5-7 ACDF 05/26/11; C1-2 posterior fusion 02/17/27), first rib resection (with right ulnar nerve decompression), TMJ arthroplasty (~ 30 years ago; reportedly had "Teflon discs" removed after ~ 5 years due to reaction of headaches/nausea), meniscal tear (s/p left knee arthroscopy/partial meniscectomy 09/2020 & on the right 12/2021), viral PNA (with hospitalization 10/2021).  Primary care notes indicate she also has a history of Polio at age 55 with ~ 2 weeks of LE paralysis.   She is a potential difficult intubation due to limited mouth opening due to limited neck mobility (multiple c-spine surgeries) and mouth opening (related to TMJ/surgery) with prominent teeth.  A McGrath video laryngoscopy was use to placed ETT for 05/26/11 ACDF at [facility]. 3 and Glidescope used to place 7.0 mm ETT on 02/17/27, mask ventilation without difficulty.   BP elevated at 12/01/22 PAT (see VS). She had taken me metoprolol succintate and telmisartan earlier that day. She did report significant pain at 8/10, which could be exacerbating. She denied chest pain or SOB. She is able to monitor readings at home. She was also instructed to contact her PCP Dr. Casper Harrison with readings to consider BP medication and/or pain medication adjustments to better control her BP prior to surgery. I updated Nikki at Dr. York Ram office.    Preoperative labs within normal limits. 12/01/22 EKG showed NSR, occasional PAC, septal infarct (age  undetermined). Her Qr pattern is more isolated to V1 on her 10/29/21 EKG tracing, but looks more similar in V1 and V2 on tracings from 02/09/17 and 05/26/21.   I will attempt to follow-up with her prior to surgery for update on BP readings.   ADDENDUM 12/06/22 9:59 AM: I called to follow-up with patient about her BP. She reported going by her PCP office on 12/02/22. Her Toprol was increased from 50 mg daily to 100 mg daily. She takes this in the mornings. She also takes gabapentin 100 mg 1 to 2 capsules TID as needed for pain. She is typically taking 2 gabapentin capsules twice a day currently. She indicated that pain and weakness in her legs remains fairly severe and constant, RLE > LLE. Gabapentin is helping some. She has been monitoring her BP at home since the medication adjustments with SBP ~ 142-155 and DBP readings ~ 79-90's. She believes her HR has been primarily in the 60's-70's range. BP readings have improved with medication adjustments. 12/01/22 EKG confirmed by cardiologist as NSR, occasional PACs, poor R wave progression, rate slower when compared to prior tracing. Anesthesia team to evaluate on the day of surgery.     VS: BP (!) 201/76   Pulse 66   Temp 36.6 C (Oral)   Resp 18   Ht 5\' 6"  (1.676 m)   Wt 75.6 kg   SpO2 100%   BMI 26.89 kg/m  Recheck BP 199/82 RUE, 196/85 LUE [facility]: BP 197/101 on 09/23/22, 174/76 on 08/11/22,159/83 on 01/18/22   PROVIDERS: Street, Stephanie Coup, MD is PCP [facility], phone [phone], fax [phone])  LABS: Labs reviewed: Acceptable for surgery. (all labs ordered are listed, but only abnormal results are displayed)  Labs Reviewed  SURGICAL PCR SCREEN  BASIC METABOLIC PANEL  CBC  TYPE AND SCREEN    IMAGES: MRI L-spine 08/31/22 (Canopy/PACS): IMPRESSION: 1. Lumbar spondylosis and degenerative disc disease, causing prominent impingement at L4-5; moderate impingement at L3-4 and L5-S1; and borderline  impingement at L2-3. 2. Degenerative anterolisthesis at L3-4 and L4-5. Small facet joint effusions at L5-S1 with associated marrow edema along the degenerated facet joints.   EKG: 12/01/22: Sinus rhythm with Premature atrial complexes Poor R wave progression Abnormal ECG When compared with ECG of 29-Oct-2021 13:09, Rate slower Confirmed by Donato Schultz ([id number]) on 12/03/2022 8:48:59 AM - See DISCUSSION.   CV: I spoke with her prior to 2018 c-spine fusion. She reported a normal cardiac cath at the time of her right first rib resection ~ 30 years ago. She thinks she may have had a stress test > 20 years ago. She did not recall ever having an echo, althought she believes she's been told she has MVP. I did not appreciate a much then.    Past Medical History:  Diagnosis Date   Anxiety    Arthritis    Cancer (HCC)    basal and squamous cell carcinoma. Lesion excisions in 2023.   Complication of anesthesia    Limited mouth opening and neck mobility, s/p TMJ surgeries    Hypertension    MVP (mitral valve prolapse)    Pneumonia 2023   Hospitalized for 2 nights, [place].   PONV (postoperative nausea and vomiting)     Past Surgical History:  Procedure Laterality Date   ABDOMINAL HYSTERECTOMY     APPENDECTOMY     CERVICAL SPINE SURGERY     FIRST RIB REMOVAL Right    HAND SURGERY Bilateral    NEUROMA SURGERY Left    POSTERIOR CERVICAL FUSION/FORAMINOTOMY N/A 02/16/2017   Procedure: CERVICAL 1- CERVICAL 2 POSTERIOR INSTRUMENTATION AND FUSION;  Surgeon: Tressie Stalker, MD;  Location: [facility] OR;  Service: Neurosurgery;  Laterality: N/A;  CERVICAL 1- CERVICAL 2 POSTERIOR INSTRUMENTATION AND FUSION   SQUAMOUS CELL CARCINOMA EXCISION  2023   2 on right leg and 1 on nose.   TMJ ARTHROPLASTY      MEDICATIONS:  ALPRAZolam (XANAX) 0.5 MG tablet   azithromycin (ZITHROMAX) 250 MG tablet   benzonatate (TESSALON) 200 MG capsule   betamethasone dipropionate 0.05 % cream    chlorpheniramine-HYDROcodone (TUSSIONEX) 10-8 MG/5ML SUER   cyclobenzaprine (FLEXERIL) 10 MG tablet   metoprolol succinate (TOPROL-XL) 50 MG 24 hr tablet   Prenatal Vit-Fe Fumarate-FA (PRENATAL MULTIVITAMIN) TABS tablet   telmisartan (MICARDIS) 40 MG tablet   traMADol (ULTRAM) 50 MG tablet   No current facility-administered medications for this encounter.    Shonna Chock, PA-C Surgical Short Stay/Anesthesiology [facility] Phone [phone] [facility] Phone [phone] 12/02/2022 2:43 PM

## 2022-12-05 MED ORDER — CHLORHEXIDINE GLUCONATE 0.12 % MT SOLN
OROMUCOSAL | Status: AC
  Filled 2022-12-05: qty 15

## 2022-12-06 NOTE — Anesthesia Preprocedure Evaluation (Signed)
Anesthesia Evaluation  Patient identified by MRN, date of birth, ID band Patient awake    Reviewed: Allergy & Precautions, NPO status , Patient's Chart, lab work & pertinent test results  History of Anesthesia Complications (+) PONV and history of anesthetic complications  Airway Mallampati: II  TM Distance: <3 FB Neck ROM: Full    Dental  (+) Teeth Intact, Dental Advisory Given   Pulmonary neg pulmonary ROS   breath sounds clear to auscultation       Cardiovascular hypertension, Pt. on home beta blockers and Pt. on medications  Rhythm:Regular Rate:Normal     Neuro/Psych   Anxiety     negative neurological ROS     GI/Hepatic negative GI ROS, Neg liver ROS,,,  Endo/Other  negative endocrine ROS    Renal/GU negative Renal ROS     Musculoskeletal  (+) Arthritis ,    Abdominal   Peds  Hematology negative hematology ROS (+)   Anesthesia Other Findings   Reproductive/Obstetrics                             Anesthesia Physical Anesthesia Plan  ASA: 2  Anesthesia Plan: General   Post-op Pain Management: Tylenol PO (pre-op)*   Induction: Intravenous  PONV Risk Score and Plan: 4 or greater and Ondansetron and Treatment may vary due to age or medical condition  Airway Management Planned: Oral ETT and Video Laryngoscope Planned  Additional Equipment: None  Intra-op Plan:   Post-operative Plan: Extubation in OR  Informed Consent: I have reviewed the patients History and Physical, chart, labs and discussed the procedure including the risks, benefits and alternatives for the proposed anesthesia with the patient or authorized representative who has indicated his/her understanding and acceptance.     Dental advisory given  Plan Discussed with: CRNA  Anesthesia Plan Comments: (PAT note written by Shonna Chock, PA-C.  )       Anesthesia Quick Evaluation

## 2022-12-07 ENCOUNTER — Ambulatory Visit (HOSPITAL_BASED_OUTPATIENT_CLINIC_OR_DEPARTMENT_OTHER): Payer: PPO | Admitting: Certified Registered Nurse Anesthetist

## 2022-12-07 ENCOUNTER — Ambulatory Visit (HOSPITAL_COMMUNITY)
Admission: RE | Admit: 2022-12-07 | Discharge: 2022-12-08 | Disposition: A | Discharge status: Home / Self Care | Payer: PPO | Attending: Neurosurgery | Admitting: Neurosurgery

## 2022-12-07 ENCOUNTER — Encounter (HOSPITAL_COMMUNITY): Payer: Self-pay | Admitting: Neurosurgery

## 2022-12-07 ENCOUNTER — Ambulatory Visit (HOSPITAL_COMMUNITY): Payer: PPO | Admitting: Vascular Surgery

## 2022-12-07 ENCOUNTER — Other Ambulatory Visit: Payer: Self-pay

## 2022-12-07 ENCOUNTER — Ambulatory Visit (HOSPITAL_COMMUNITY): Payer: PPO

## 2022-12-07 ENCOUNTER — Encounter (HOSPITAL_COMMUNITY)
Admission: RE | Disposition: A | Discharge status: Home / Self Care | Payer: Self-pay | Source: Home / Self Care | Attending: Neurosurgery

## 2022-12-07 DIAGNOSIS — F419 Anxiety disorder, unspecified: Secondary | ICD-10-CM

## 2022-12-07 DIAGNOSIS — M4316 Spondylolisthesis, lumbar region: Secondary | ICD-10-CM | POA: Diagnosis not present

## 2022-12-07 DIAGNOSIS — M4726 Other spondylosis with radiculopathy, lumbar region: Secondary | ICD-10-CM | POA: Diagnosis not present

## 2022-12-07 DIAGNOSIS — M48062 Spinal stenosis, lumbar region with neurogenic claudication: Secondary | ICD-10-CM

## 2022-12-07 DIAGNOSIS — M5116 Intervertebral disc disorders with radiculopathy, lumbar region: Secondary | ICD-10-CM | POA: Insufficient documentation

## 2022-12-07 DIAGNOSIS — I10 Essential (primary) hypertension: Secondary | ICD-10-CM | POA: Diagnosis not present

## 2022-12-07 DIAGNOSIS — Z4789 Encounter for other orthopedic aftercare: Secondary | ICD-10-CM | POA: Diagnosis not present

## 2022-12-07 DIAGNOSIS — Z981 Arthrodesis status: Secondary | ICD-10-CM | POA: Diagnosis not present

## 2022-12-07 SURGERY — POSTERIOR LUMBAR FUSION 1 LEVEL
Anesthesia: General

## 2022-12-07 MED ORDER — ONDANSETRON HCL 4 MG/2ML IJ SOLN
4.0000 mg | Freq: Four times a day (QID) | INTRAMUSCULAR | Status: DC | PRN
  Administered 2022-12-07: 4 mg via INTRAVENOUS
  Filled 2022-12-07: qty 2

## 2022-12-07 MED ORDER — BUPIVACAINE LIPOSOME 1.3 % IJ SUSP
INTRAMUSCULAR | Status: AC
  Filled 2022-12-07: qty 20

## 2022-12-07 MED ORDER — PROPOFOL 10 MG/ML IV BOLUS
INTRAVENOUS | Status: DC | PRN
  Administered 2022-12-07: 130 mg via INTRAVENOUS

## 2022-12-07 MED ORDER — AMISULPRIDE (ANTIEMETIC) 5 MG/2ML IV SOLN
10.0000 mg | Freq: Once | INTRAVENOUS | Status: AC | PRN
  Administered 2022-12-07: 10 mg via INTRAVENOUS

## 2022-12-07 MED ORDER — ACETAMINOPHEN 160 MG/5ML PO SOLN: 325.0000 mg | Freq: Once | ORAL | Status: DC | PRN

## 2022-12-07 MED ORDER — LACTATED RINGERS IV SOLN: INTRAVENOUS | Status: DC

## 2022-12-07 MED ORDER — DOCUSATE SODIUM 100 MG PO CAPS
100.0000 mg | ORAL_CAPSULE | Freq: Two times a day (BID) | ORAL | Status: DC
  Administered 2022-12-07: 100 mg via ORAL
  Filled 2022-12-07: qty 1

## 2022-12-07 MED ORDER — BACITRACIN ZINC 500 UNIT/GM EX OINT
TOPICAL_OINTMENT | CUTANEOUS | Status: DC | PRN
  Administered 2022-12-07: 1 via TOPICAL

## 2022-12-07 MED ORDER — SUGAMMADEX SODIUM 200 MG/2ML IV SOLN
INTRAVENOUS | Status: DC | PRN
  Administered 2022-12-07: 200 mg via INTRAVENOUS

## 2022-12-07 MED ORDER — ROCURONIUM BROMIDE 10 MG/ML (PF) SYRINGE
PREFILLED_SYRINGE | INTRAVENOUS | Status: AC
  Filled 2022-12-07: qty 10

## 2022-12-07 MED ORDER — THROMBIN 5000 UNITS EX SOLR
OROMUCOSAL | Status: DC | PRN
  Administered 2022-12-07: 5 mL via TOPICAL

## 2022-12-07 MED ORDER — FENTANYL CITRATE (PF) 250 MCG/5ML IJ SOLN
INTRAMUSCULAR | Status: AC
  Filled 2022-12-07: qty 5

## 2022-12-07 MED ORDER — ACETAMINOPHEN 500 MG PO TABS
1000.0000 mg | ORAL_TABLET | Freq: Four times a day (QID) | ORAL | Status: DC
  Administered 2022-12-07 – 2022-12-08 (×3): 1000 mg via ORAL
  Filled 2022-12-07 (×3): qty 2

## 2022-12-07 MED ORDER — CEFAZOLIN SODIUM-DEXTROSE 2-4 GM/100ML-% IV SOLN
2.0000 g | INTRAVENOUS | Status: AC
  Administered 2022-12-07: 2 g via INTRAVENOUS
  Filled 2022-12-07: qty 100

## 2022-12-07 MED ORDER — BUPIVACAINE-EPINEPHRINE (PF) 0.25% -1:200000 IJ SOLN
INTRAMUSCULAR | Status: AC
  Filled 2022-12-07: qty 30

## 2022-12-07 MED ORDER — ROCURONIUM BROMIDE 10 MG/ML (PF) SYRINGE
PREFILLED_SYRINGE | INTRAVENOUS | Status: DC | PRN
  Administered 2022-12-07: 30 mg via INTRAVENOUS
  Administered 2022-12-07: 20 mg via INTRAVENOUS
  Administered 2022-12-07: 70 mg via INTRAVENOUS

## 2022-12-07 MED ORDER — SODIUM CHLORIDE 0.9 % IV SOLN
250.0000 mL | INTRAVENOUS | Status: DC
  Administered 2022-12-07: 250 mL via INTRAVENOUS

## 2022-12-07 MED ORDER — THROMBIN 5000 UNITS EX SOLR
CUTANEOUS | Status: AC
  Filled 2022-12-07: qty 5000

## 2022-12-07 MED ORDER — BISACODYL 10 MG RE SUPP: 10.0000 mg | Freq: Every day | RECTAL | Status: DC | PRN

## 2022-12-07 MED ORDER — CHLORHEXIDINE GLUCONATE 0.12 % MT SOLN
15.0000 mL | Freq: Once | OROMUCOSAL | Status: AC
  Administered 2022-12-07: 15 mL via OROMUCOSAL
  Filled 2022-12-07: qty 15

## 2022-12-07 MED ORDER — CYCLOBENZAPRINE HCL 10 MG PO TABS
ORAL_TABLET | ORAL | Status: AC
  Filled 2022-12-07: qty 2

## 2022-12-07 MED ORDER — IRBESARTAN 150 MG PO TABS
150.0000 mg | ORAL_TABLET | Freq: Every day | ORAL | Status: DC
  Filled 2022-12-07: qty 1

## 2022-12-07 MED ORDER — LIDOCAINE 2% (20 MG/ML) 5 ML SYRINGE
INTRAMUSCULAR | Status: DC | PRN
  Administered 2022-12-07: 50 mg via INTRAVENOUS

## 2022-12-07 MED ORDER — BUPIVACAINE-EPINEPHRINE (PF) 0.25% -1:200000 IJ SOLN
INTRAMUSCULAR | Status: DC | PRN
  Administered 2022-12-07: 10 mL

## 2022-12-07 MED ORDER — ZOLPIDEM TARTRATE 5 MG PO TABS: 5.0000 mg | ORAL_TABLET | Freq: Every evening | ORAL | Status: DC | PRN

## 2022-12-07 MED ORDER — METOPROLOL SUCCINATE ER 50 MG PO TB24
50.0000 mg | ORAL_TABLET | Freq: Every day | ORAL | Status: DC
  Filled 2022-12-07: qty 1

## 2022-12-07 MED ORDER — MENTHOL 3 MG MT LOZG: 1.0000 | LOZENGE | OROMUCOSAL | Status: DC | PRN

## 2022-12-07 MED ORDER — GABAPENTIN 100 MG PO CAPS: 100.0000 mg | ORAL_CAPSULE | Freq: Three times a day (TID) | ORAL | Status: DC | PRN

## 2022-12-07 MED ORDER — ONDANSETRON HCL 4 MG PO TABS
4.0000 mg | ORAL_TABLET | Freq: Four times a day (QID) | ORAL | Status: DC | PRN
  Administered 2022-12-08 (×2): 4 mg via ORAL
  Filled 2022-12-07 (×2): qty 1

## 2022-12-07 MED ORDER — HYDRALAZINE HCL 20 MG/ML IJ SOLN
5.0000 mg | INTRAMUSCULAR | Status: AC | PRN
  Administered 2022-12-07 (×3): 5 mg via INTRAVENOUS

## 2022-12-07 MED ORDER — SODIUM CHLORIDE 0.9% FLUSH: 3.0000 mL | Freq: Two times a day (BID) | INTRAVENOUS | Status: DC

## 2022-12-07 MED ORDER — ACETAMINOPHEN 500 MG PO TABS: 1000.0000 mg | ORAL_TABLET | Freq: Once | ORAL | Status: DC

## 2022-12-07 MED ORDER — ALPRAZOLAM 0.5 MG PO TABS: 0.5000 mg | ORAL_TABLET | Freq: Three times a day (TID) | ORAL | Status: DC | PRN

## 2022-12-07 MED ORDER — SODIUM CHLORIDE 0.9% FLUSH: 3.0000 mL | INTRAVENOUS | Status: DC | PRN

## 2022-12-07 MED ORDER — MEPERIDINE HCL 25 MG/ML IJ SOLN: 6.2500 mg | INTRAMUSCULAR | Status: DC | PRN

## 2022-12-07 MED ORDER — ONDANSETRON HCL 4 MG/2ML IJ SOLN
INTRAMUSCULAR | Status: AC
  Filled 2022-12-07: qty 2

## 2022-12-07 MED ORDER — HYDRALAZINE HCL 20 MG/ML IJ SOLN
INTRAMUSCULAR | Status: AC
  Filled 2022-12-07: qty 1

## 2022-12-07 MED ORDER — OXYCODONE HCL 5 MG PO TABS: 10.0000 mg | ORAL_TABLET | ORAL | Status: DC | PRN

## 2022-12-07 MED ORDER — CHLORHEXIDINE GLUCONATE CLOTH 2 % EX PADS: 6.0000 | MEDICATED_PAD | Freq: Once | CUTANEOUS | Status: DC

## 2022-12-07 MED ORDER — EPHEDRINE SULFATE-NACL 50-0.9 MG/10ML-% IV SOSY
PREFILLED_SYRINGE | INTRAVENOUS | Status: DC | PRN
  Administered 2022-12-07 (×2): 5 mg via INTRAVENOUS

## 2022-12-07 MED ORDER — BUPIVACAINE LIPOSOME 1.3 % IJ SUSP
INTRAMUSCULAR | Status: DC | PRN
  Administered 2022-12-07: 20 mL

## 2022-12-07 MED ORDER — ACETAMINOPHEN 325 MG PO TABS: 325.0000 mg | ORAL_TABLET | Freq: Once | ORAL | Status: DC | PRN

## 2022-12-07 MED ORDER — CEFAZOLIN SODIUM-DEXTROSE 2-4 GM/100ML-% IV SOLN
2.0000 g | Freq: Three times a day (TID) | INTRAVENOUS | Status: AC
  Administered 2022-12-07 – 2022-12-08 (×2): 2 g via INTRAVENOUS
  Filled 2022-12-07 (×2): qty 100

## 2022-12-07 MED ORDER — PROPOFOL 10 MG/ML IV BOLUS
INTRAVENOUS | Status: AC
  Filled 2022-12-07: qty 20

## 2022-12-07 MED ORDER — DEXAMETHASONE SODIUM PHOSPHATE 10 MG/ML IJ SOLN
INTRAMUSCULAR | Status: AC
  Filled 2022-12-07: qty 1

## 2022-12-07 MED ORDER — PROMETHAZINE HCL 25 MG/ML IJ SOLN: 6.2500 mg | INTRAMUSCULAR | Status: DC | PRN

## 2022-12-07 MED ORDER — LIDOCAINE 2% (20 MG/ML) 5 ML SYRINGE
INTRAMUSCULAR | Status: AC
  Filled 2022-12-07: qty 5

## 2022-12-07 MED ORDER — ORAL CARE MOUTH RINSE: 15.0000 mL | Freq: Once | OROMUCOSAL | Status: AC

## 2022-12-07 MED ORDER — DEXAMETHASONE SODIUM PHOSPHATE 10 MG/ML IJ SOLN
INTRAMUSCULAR | Status: DC | PRN
  Administered 2022-12-07: 5 mg via INTRAVENOUS

## 2022-12-07 MED ORDER — FENTANYL CITRATE (PF) 250 MCG/5ML IJ SOLN
INTRAMUSCULAR | Status: DC | PRN
  Administered 2022-12-07 (×7): 50 ug via INTRAVENOUS

## 2022-12-07 MED ORDER — ACETAMINOPHEN 10 MG/ML IV SOLN: 1000.0000 mg | Freq: Once | INTRAVENOUS | Status: DC | PRN

## 2022-12-07 MED ORDER — HYDROMORPHONE HCL 1 MG/ML IJ SOLN
INTRAMUSCULAR | Status: AC
  Filled 2022-12-07: qty 1

## 2022-12-07 MED ORDER — CYCLOBENZAPRINE HCL 10 MG PO TABS
10.0000 mg | ORAL_TABLET | Freq: Three times a day (TID) | ORAL | Status: DC | PRN
  Administered 2022-12-07 – 2022-12-08 (×2): 10 mg via ORAL
  Filled 2022-12-07: qty 1

## 2022-12-07 MED ORDER — HYDROMORPHONE HCL 1 MG/ML IJ SOLN
0.2500 mg | INTRAMUSCULAR | Status: DC | PRN
  Administered 2022-12-07: 0.25 mg via INTRAVENOUS
  Administered 2022-12-07 (×2): 0.5 mg via INTRAVENOUS
  Administered 2022-12-07 (×3): 0.25 mg via INTRAVENOUS

## 2022-12-07 MED ORDER — PHENOL 1.4 % MT LIQD: 1.0000 | OROMUCOSAL | Status: DC | PRN

## 2022-12-07 MED ORDER — SURGIRINSE WOUND IRRIGATION SYSTEM - OPTIME
TOPICAL | Status: DC | PRN
  Administered 2022-12-07: 450 mL via TOPICAL

## 2022-12-07 MED ORDER — ACETAMINOPHEN 650 MG RE SUPP: 650.0000 mg | RECTAL | Status: DC | PRN

## 2022-12-07 MED ORDER — ACETAMINOPHEN 10 MG/ML IV SOLN
INTRAVENOUS | Status: DC | PRN
  Administered 2022-12-07: 1000 mg via INTRAVENOUS

## 2022-12-07 MED ORDER — ACETAMINOPHEN 325 MG PO TABS: 650.0000 mg | ORAL_TABLET | ORAL | Status: DC | PRN

## 2022-12-07 MED ORDER — ACETAMINOPHEN 10 MG/ML IV SOLN
INTRAVENOUS | Status: AC
  Filled 2022-12-07: qty 100

## 2022-12-07 MED ORDER — TRAMADOL HCL 50 MG PO TABS
50.0000 mg | ORAL_TABLET | Freq: Four times a day (QID) | ORAL | Status: DC | PRN
  Administered 2022-12-07: 50 mg via ORAL
  Filled 2022-12-07: qty 1

## 2022-12-07 MED ORDER — BACITRACIN ZINC 500 UNIT/GM EX OINT
TOPICAL_OINTMENT | CUTANEOUS | Status: AC
  Filled 2022-12-07: qty 28.35

## 2022-12-07 MED ORDER — AMISULPRIDE (ANTIEMETIC) 5 MG/2ML IV SOLN
INTRAVENOUS | Status: AC
  Filled 2022-12-07: qty 4

## 2022-12-07 MED ORDER — ONDANSETRON HCL 4 MG/2ML IJ SOLN
INTRAMUSCULAR | Status: DC | PRN
  Administered 2022-12-07: 4 mg via INTRAVENOUS

## 2022-12-07 MED ORDER — PHENYLEPHRINE HCL-NACL 20-0.9 MG/250ML-% IV SOLN
INTRAVENOUS | Status: DC | PRN
  Administered 2022-12-07: 15 ug/min via INTRAVENOUS

## 2022-12-07 MED ORDER — MORPHINE SULFATE (PF) 4 MG/ML IV SOLN: 4.0000 mg | INTRAVENOUS | Status: DC | PRN

## 2022-12-07 MED ORDER — 0.9 % SODIUM CHLORIDE (POUR BTL) OPTIME
TOPICAL | Status: DC | PRN
  Administered 2022-12-07: 1000 mL

## 2022-12-07 MED ORDER — OXYCODONE HCL 5 MG PO TABS
5.0000 mg | ORAL_TABLET | ORAL | Status: DC | PRN
  Administered 2022-12-08 (×2): 5 mg via ORAL
  Filled 2022-12-07 (×2): qty 1

## 2022-12-07 MED ORDER — EPHEDRINE 5 MG/ML INJ
INTRAVENOUS | Status: AC
  Filled 2022-12-07: qty 5

## 2022-12-07 SURGICAL SUPPLY — 68 items
APL SKNCLS STERI-STRIP NONHPOA (GAUZE/BANDAGES/DRESSINGS) ×1
BAG COUNTER SPONGE SURGICOUNT (BAG) ×1 IMPLANT
BAG SPNG CNTER NS LX DISP (BAG) ×2
BASKET BONE COLLECTION (BASKET) ×1 IMPLANT
BENZOIN TINCTURE PRP APPL 2/3 (GAUZE/BANDAGES/DRESSINGS) ×1 IMPLANT
BLADE CLIPPER SURG (BLADE) IMPLANT
BUR MATCHSTICK NEURO 3.0 LAGG (BURR) ×1 IMPLANT
BUR PRECISION FLUTE 6.0 (BURR) ×1 IMPLANT
CANISTER SUCT 3000ML PPV (MISCELLANEOUS) ×1 IMPLANT
CAP LOCK DLX THRD (Cap) IMPLANT
CNTNR URN SCR LID CUP LEK RST (MISCELLANEOUS) ×1 IMPLANT
CONT SPEC 4OZ STRL OR WHT (MISCELLANEOUS) ×1
COVER BACK TABLE 60X90IN (DRAPES) ×1 IMPLANT
DRAPE C-ARM 42X72 X-RAY (DRAPES) ×2 IMPLANT
DRAPE HALF SHEET 40X57 (DRAPES) ×1 IMPLANT
DRAPE LAPAROTOMY 100X72X124 (DRAPES) ×1 IMPLANT
DRAPE SURG 17X23 STRL (DRAPES) ×4 IMPLANT
DRSG OPSITE POSTOP 4X6 (GAUZE/BANDAGES/DRESSINGS) ×1 IMPLANT
ELECT BLADE 4.0 EZ CLEAN MEGAD (MISCELLANEOUS) ×1
ELECT REM PT RETURN 9FT ADLT (ELECTROSURGICAL) ×1
ELECTRODE BLDE 4.0 EZ CLN MEGD (MISCELLANEOUS) ×1 IMPLANT
ELECTRODE REM PT RTRN 9FT ADLT (ELECTROSURGICAL) ×1 IMPLANT
EVACUATOR 1/8 PVC DRAIN (DRAIN) IMPLANT
GAUZE 4X4 16PLY ~~LOC~~+RFID DBL (SPONGE) ×1 IMPLANT
GLOVE BIO SURGEON STRL SZ 6 (GLOVE) ×1 IMPLANT
GLOVE BIO SURGEON STRL SZ8 (GLOVE) ×2 IMPLANT
GLOVE BIO SURGEON STRL SZ8.5 (GLOVE) ×2 IMPLANT
GLOVE BIOGEL PI IND STRL 6.5 (GLOVE) ×1 IMPLANT
GLOVE EXAM NITRILE XL STR (GLOVE) IMPLANT
GOWN STRL REUS W/ TWL LRG LVL3 (GOWN DISPOSABLE) ×1 IMPLANT
GOWN STRL REUS W/ TWL XL LVL3 (GOWN DISPOSABLE) ×2 IMPLANT
GOWN STRL REUS W/TWL 2XL LVL3 (GOWN DISPOSABLE) IMPLANT
GOWN STRL REUS W/TWL LRG LVL3 (GOWN DISPOSABLE) ×1
GOWN STRL REUS W/TWL XL LVL3 (GOWN DISPOSABLE) ×2
HEMOSTAT POWDER KIT SURGIFOAM (HEMOSTASIS) ×1 IMPLANT
KIT BASIN OR (CUSTOM PROCEDURE TRAY) ×1 IMPLANT
KIT GRAFTMAG DEL NEURO DISP (NEUROSURGERY SUPPLIES) IMPLANT
KIT POSITION SURG JACKSON T1 (MISCELLANEOUS) ×1 IMPLANT
KIT TURNOVER KIT B (KITS) ×1 IMPLANT
NDL HYPO 21X1.5 SAFETY (NEEDLE) IMPLANT
NEEDLE HYPO 21X1.5 SAFETY (NEEDLE) ×1 IMPLANT
NEEDLE HYPO 22GX1.5 SAFETY (NEEDLE) ×1 IMPLANT
NS IRRIG 1000ML POUR BTL (IV SOLUTION) ×1 IMPLANT
PACK LAMINECTOMY NEURO (CUSTOM PROCEDURE TRAY) ×1 IMPLANT
PAD ARMBOARD 7.5X6 YLW CONV (MISCELLANEOUS) ×3 IMPLANT
PATTIES SURGICAL .5 X1 (DISPOSABLE) IMPLANT
PUTTY DBM 10CC CALC GRAN (Putty) IMPLANT
ROD CREO DLX CVD 6.35X40 (Rod) IMPLANT
ROD CURVED TI 6.35X40 (Rod) ×2 IMPLANT
SCREW PA DLX CREO 7.5X50 (Screw) IMPLANT
SCREW PA DLX CREO 7.5X55 (Screw) IMPLANT
SOL ELECTROSURG ANTI STICK (MISCELLANEOUS) ×1
SOLUTION ELECTROSURG ANTI STCK (MISCELLANEOUS) ×1 IMPLANT
SOLUTION IRRIG SURGIPHOR (IV SOLUTION) ×1 IMPLANT
SPACER ALTERA 10X31 8-12MM-8 (Spacer) IMPLANT
SPIKE FLUID TRANSFER (MISCELLANEOUS) ×1 IMPLANT
SPONGE NEURO XRAY DETECT 1X3 (DISPOSABLE) IMPLANT
SPONGE SURGIFOAM ABS GEL 100 (HEMOSTASIS) IMPLANT
SPONGE T-LAP 4X18 ~~LOC~~+RFID (SPONGE) IMPLANT
STRIP CLOSURE SKIN 1/2X4 (GAUZE/BANDAGES/DRESSINGS) ×1 IMPLANT
SUT VIC AB 1 CT1 18XBRD ANBCTR (SUTURE) ×2 IMPLANT
SUT VIC AB 1 CT1 8-18 (SUTURE) ×2
SUT VIC AB 2-0 CP2 18 (SUTURE) ×2 IMPLANT
SYR 20ML LL LF (SYRINGE) IMPLANT
TOWEL GREEN STERILE (TOWEL DISPOSABLE) ×1 IMPLANT
TOWEL GREEN STERILE FF (TOWEL DISPOSABLE) ×1 IMPLANT
TRAY FOLEY MTR SLVR 16FR STAT (SET/KITS/TRAYS/PACK) ×1 IMPLANT
WATER STERILE IRR 1000ML POUR (IV SOLUTION) ×1 IMPLANT

## 2022-12-07 NOTE — H&P (Signed)
Subjective: The patient is a 77 year old white female who was complained of back and right greater left leg pain consistent with neurogenic claudication/lumbar radiculopathy.  She has failed medical management and was worked up with a lumbar MRI and lumbar x-rays which demonstrated an L4-5 spondylolisthesis with spinal stenosis.  I discussed the various treatment options with her.  She has decided proceed with surgery.  Past Medical History:  Diagnosis Date   Anxiety    Arthritis    Cancer (HCC)    basal and squamous cell carcinoma. Lesion excisions in 2023.   Complication of anesthesia    Limited mouth opening and neck mobility, s/p TMJ surgeries    Hypertension    MVP (mitral valve prolapse)    Pneumonia 2023   Hospitalized for 2 nights, [place].   PONV (postoperative nausea and vomiting)     Past Surgical History:  Procedure Laterality Date   ABDOMINAL HYSTERECTOMY     APPENDECTOMY     CERVICAL SPINE SURGERY     FIRST RIB REMOVAL Right    HAND SURGERY Bilateral    NEUROMA SURGERY Left    POSTERIOR CERVICAL FUSION/FORAMINOTOMY N/A 02/16/2017   Procedure: CERVICAL 1- CERVICAL 2 POSTERIOR INSTRUMENTATION AND FUSION;  Surgeon: Tressie Stalker, MD;  Location: [facility] OR;  Service: Neurosurgery;  Laterality: N/A;  CERVICAL 1- CERVICAL 2 POSTERIOR INSTRUMENTATION AND FUSION   SQUAMOUS CELL CARCINOMA EXCISION  2023   2 on right leg and 1 on nose.   TMJ ARTHROPLASTY      No Known Allergies  Social History   Tobacco Use   Smoking status: Never   Smokeless tobacco: Never  Substance Use Topics   Alcohol use: Yes    Alcohol/week: 3.0 standard drinks of alcohol    Types: 3 Shots of liquor per week    Comment: 3 drinks a week.    History reviewed. No pertinent family history. Prior to Admission medications   Medication Sig Start Date End Date Taking? Authorizing Provider  ALPRAZolam Prudy Feeler) 0.5 MG tablet Take 0.5 mg by mouth 3 (three) times daily as needed for anxiety. 05/14/21   Yes [provider]  cyclobenzaprine (FLEXERIL) 10 MG tablet Take 1 tablet (10 mg total) by mouth 3 (three) times daily as needed for muscle spasms. 02/17/17  Yes Tressie Stalker, MD  gabapentin (NEURONTIN) 100 MG capsule Take 100 mg by mouth 3 (three) times daily as needed (for pain). Take 1 to 2 capsules 3 times daily as needed for pain.   Yes [provider]  metoprolol succinate (TOPROL-XL) 50 MG 24 hr tablet Take 50 mg by mouth daily. Toprol XL increased to 50 mg 2 tablets daily on 12/02/22. 03/14/21  Yes [provider]  Prenatal Vit-Fe Fumarate-FA (PRENATAL MULTIVITAMIN) TABS tablet Take 1 tablet by mouth daily at 12 noon.   Yes [provider]  telmisartan (MICARDIS) 40 MG tablet Take 40 mg by mouth daily. 02/14/21  Yes [provider]  traMADol (ULTRAM) 50 MG tablet Take 50 mg by mouth 4 (four) times daily as needed for moderate pain.   Yes [provider]  azithromycin (ZITHROMAX) 250 MG tablet Take 1 tablet (250 mg total) by mouth daily. Patient not taking: Reported on 11/30/2022 10/30/21   Sunnie Nielsen, DO  benzonatate (TESSALON) 200 MG capsule Take 1 capsule (200 mg total) by mouth 3 (three) times daily as needed for cough. Patient not taking: Reported on 11/30/2022 10/30/21   Sunnie Nielsen, DO  betamethasone dipropionate 0.05 % cream 1  application. 2 (two) times daily as needed (rash). Patient not taking: Reported on 11/30/2022 09/16/21   [provider]  chlorpheniramine-HYDROcodone (TUSSIONEX) 10-8 MG/5ML SUER Take 5 mLs by mouth every 8 (eight) hours as needed for cough. Patient not taking: Reported on 11/30/2022 10/30/21   Sunnie Nielsen, DO     Review of Systems  Positive ROS: As above  All other systems have been reviewed and were otherwise negative with the exception of those mentioned in the HPI and as above.  Objective: Vital signs in last 24 hours: Temp:  [97.6 F (36.4 C)] 97.6 F (36.4 C) (05/15  0844) Pulse Rate:  [68] 68 (05/15 0844) Resp:  [17] 17 (05/15 0844) BP: (195)/(87) 195/87 (05/15 0844) SpO2:  [99 %] 99 % (05/15 0844) Weight:  [74.8 kg] 74.8 kg (05/15 0844) Estimated body mass index is 26.63 kg/m as calculated from the following:   Height as of this encounter: 5\' 6"  (1.676 m).   Weight as of this encounter: 74.8 kg.   General Appearance: Alert Head: Normocephalic, without obvious abnormality, atraumatic Eyes: PERRL, conjunctiva/corneas clear, EOM's intact,    Ears: Normal  Throat: Normal  Neck: Supple, Back: unremarkable Lungs: Clear to auscultation bilaterally, respirations unlabored Heart: Regular rate and rhythm, no murmur, rub or gallop Abdomen: Soft, non-tender Extremities: Extremities normal, atraumatic, no cyanosis or edema Skin: unremarkable  NEUROLOGIC:   Mental status: alert and oriented,Motor Exam - grossly normal Sensory Exam - grossly normal Reflexes:  Coordination - grossly normal Gait - grossly normal Balance - grossly normal Cranial Nerves: I: smell Not tested  II: visual acuity  OS: Normal  OD: Normal   II: visual fields Full to confrontation  II: pupils Equal, round, reactive to light  III,VII: ptosis None  III,IV,VI: extraocular muscles  Full ROM  V: mastication Normal  V: facial light touch sensation  Normal  V,VII: corneal reflex  Present  VII: facial muscle function - upper  Normal  VII: facial muscle function - lower Normal  VIII: hearing Not tested  IX: soft palate elevation  Normal  IX,X: gag reflex Present  XI: trapezius strength  5/5  XI: sternocleidomastoid strength 5/5  XI: neck flexion strength  5/5  XII: tongue strength  Normal    Data Review Lab Results  Component Value Date   WBC 7.2 12/01/2022   HGB 13.9 12/01/2022   HCT 41.6 12/01/2022   MCV 93.5 12/01/2022   PLT 218 12/01/2022   Lab Results  Component Value Date   NA 138 12/01/2022   K 4.2 12/01/2022   CL 103 12/01/2022   CO2 27 12/01/2022    BUN 14 12/01/2022   CREATININE 0.82 12/01/2022   GLUCOSE 94 12/01/2022   No results found for: "INR", "PROTIME"  Assessment/Plan: L4-5 spondylolisthesis, spinal stenosis, facet arthropathy, lumbar radiculopathy, neurogenic claudication, lumbago: I have discussed the situation with the patient and her husband.  I reviewed her imaging studies with him and pointed out the abnormalities.  We have discussed the various treatment options including surgery.  I have described the surgical treatment option of an L4-5 decompression, instrumentation and fusion.  I have shown her surgical models.  I have given her a surgical pamphlet.  We have discussed the risk, benefits, alternatives, expected postop course, and likelihood of achieving our goals with surgery.  I have answered all her questions.  She has decided proceed with surgery.   Cristi Loron 12/07/2022 11:33 AM

## 2022-12-07 NOTE — Anesthesia Postprocedure Evaluation (Signed)
Anesthesia Post Note  Patient: Christine Hart  Procedure(s) Performed: POSTERIOR LUMBAR INTERBODY FUSION,INTERBODY PROSTHESIS,POSTERIOR INSTRUMENTATION, LUMBAR FOUR-FIVE     Patient location during evaluation: PACU Anesthesia Type: General Level of consciousness: awake and alert Pain management: pain level controlled Vital Signs Assessment: post-procedure vital signs reviewed and stable Respiratory status: spontaneous breathing, nonlabored ventilation, respiratory function stable and patient connected to nasal cannula oxygen Cardiovascular status: blood pressure returned to baseline and stable Postop Assessment: no apparent nausea or vomiting Anesthetic complications: yes  Encounter Notable Events  Notable Event Outcome Phase Comment  Difficult to intubate - expected  Intraprocedure Filed from anesthesia note documentation.    Last Vitals:  Vitals:   12/07/22 1545 12/07/22 1600  BP: (!) 179/81 (!) 170/73  Pulse: 62 (!) 59  Resp: 13 13  Temp:    SpO2: 100% 99%    Last Pain:  Vitals:   12/07/22 1545  TempSrc:   PainSc: 10-Worst pain ever                 Shelton Silvas

## 2022-12-07 NOTE — Progress Notes (Signed)
Orthopedic Tech Progress Note Patient Details:  Christine Hart [birth year] [id number] LSO was delivered to patient's room.  Ortho Devices Type of Ortho Device: Lumbar corsett Ortho Device/Splint Interventions: Ordered      Genelle Bal Tyner Codner 12/07/2022, 5:44 PM

## 2022-12-07 NOTE — Transfer of Care (Signed)
Immediate Anesthesia Transfer of Care Note  Patient: Christine Hart  Procedure(s) Performed: POSTERIOR LUMBAR INTERBODY FUSION,INTERBODY PROSTHESIS,POSTERIOR INSTRUMENTATION, LUMBAR FOUR-FIVE  Patient Location: PACU  Anesthesia Type:General  Level of Consciousness: drowsy and patient cooperative  Airway & Oxygen Therapy: Patient Spontanous Breathing and Patient connected to nasal cannula oxygen  Post-op Assessment: Report given to RN and Post -op Vital signs reviewed and stable  Post vital signs: Reviewed and stable  Last Vitals:  Vitals Value Taken Time  BP 149/91 12/07/22 1528  Temp    Pulse 69 12/07/22 1531  Resp 20 12/07/22 1531  SpO2 100 % 12/07/22 1531  Vitals shown include unvalidated device data.  Last Pain:  Vitals:   12/07/22 0859  TempSrc:   PainSc: 6       Patients Stated Pain Goal: 3 (12/07/22 0859)  Complications:  Encounter Notable Events  Notable Event Outcome Phase Comment  Difficult to intubate - expected  Intraprocedure Filed from anesthesia note documentation.

## 2022-12-07 NOTE — Anesthesia Procedure Notes (Signed)
Procedure Name: Intubation Date/Time: 12/07/2022 12:10 PM  Performed by: Waynard Edwards, CRNAPre-anesthesia Checklist: Patient identified, Emergency Drugs available, Suction available and Patient being monitored Patient Re-evaluated:Patient Re-evaluated prior to induction Oxygen Delivery Method: Circle system utilized Preoxygenation: Pre-oxygenation with 100% oxygen Induction Type: IV induction Ventilation: Mask ventilation without difficulty Laryngoscope Size: Glidescope and 3 Grade View: Grade I Tube type: Oral Tube size: 7.0 mm Number of attempts: 1 Airway Equipment and Method: Video-laryngoscopy and Rigid stylet Placement Confirmation: ETT inserted through vocal cords under direct vision, positive ETCO2 and breath sounds checked- equal and bilateral Secured at: 22 cm Tube secured with: Tape Dental Injury: Teeth and Oropharynx as per pre-operative assessment  Difficulty Due To: Difficulty was anticipated, Difficult Airway- due to limited oral opening and Difficult Airway- due to reduced neck mobility

## 2022-12-07 NOTE — Op Note (Signed)
Brief history: The patient is a 77 year old white female who is complaining of back and bilateral leg pain consistent with neurogenic claudication.  She has failed medical management.  She was worked up with a lumbar MRI and lumbar x-rays which demonstrated lumbar spondylolisthesis, facet arthropathy, spinal stenosis, etc.  I discussed the various treatment options with her.  She has decided to proceed with surgery.  Preoperative diagnosis: L4-5 spondylolisthesis, facet arthropathy, degenerative disc disease, spinal stenosis compressing both the L4 and the L5 nerve nerve roots; lumbago; lumbar radiculopathy; neurogenic claudication  Postoperative diagnosis: The same  Procedure: Bilateral L4-5 laminotomy/foraminotomies/medial facetectomy to decompress the bilateral L4 and L5 nerve roots(the work required to do this was in addition to the work required to do the posterior lumbar interbody fusion because of the patient's spinal stenosis, facet arthropathy. Etc. requiring a wide decompression of the nerve roots.);  Right L4-5 transforaminal lumbar interbody fusion with local morselized autograft bone and Zimmer DBM; insertion of interbody prosthesis at L4-5 (globus peek expandable interbody prosthesis); posterior nonsegmental instrumentation from L4 to L5 with globus titanium pedicle screws and rods; posterior lateral arthrodesis at L4-5 with local morselized autograft bone and Zimmer DBM.  Surgeon: Dr. Delma Officer  Asst.: Dr. Coletta Memos and Hildred Priest, NP  Anesthesia: Gen. endotracheal  Estimated blood loss: 200 cc  Drains: None  Complications: None  Description of procedure: The patient was brought to the operating room by the anesthesia team. General endotracheal anesthesia was induced. The patient was turned to the prone position on the Wilson frame. The patient's lumbosacral region was then prepared with Betadine scrub and Betadine solution. Sterile drapes were applied.  I then injected  the area to be incised with Marcaine with epinephrine solution. I then used the scalpel to make a linear midline incision over the L4-5 interspace. I then used electrocautery to perform a bilateral subperiosteal dissection exposing the spinous process and lamina of L4-5. We then obtained intraoperative radiograph to confirm our location. We then inserted the Verstrac retractor to provide exposure.  I began the decompression by using the high speed drill to perform laminotomies at L4-5 bilaterally. We then used the Kerrison punches to widen the laminotomy and removed the ligamentum flavum at L4-5 bilaterally. We used the Kerrison punches to remove the medial facets at L4-5 bilaterally, we removed the right L4-5 facet. We performed wide foraminotomies about the bilateral L4 and L5 nerve roots completing the decompression.  We now turned our attention to the posterior lumbar interbody fusion. I used a scalpel to incise the intervertebral disc at L4-5 bilaterally. I then performed a partial intervertebral discectomy at L4-5 bilaterally using the pituitary forceps. We prepared the vertebral endplates at L4-5 bilaterally for the fusion by removing the soft tissues with the curettes. We then used the trial spacers to pick the appropriate sized interbody prosthesis. We prefilled his prosthesis with a combination of local morselized autograft bone that we obtained during the decompression as well as Zimmer DBM. We inserted the prefilled prosthesis into the interspace at L4-5 from the right, we then turned and expanded the prosthesis. There was a good snug fit of the prosthesis in the interspace. We then filled and the remainder of the intervertebral disc space with local morselized autograft bone and Zimmer DBM. This completed the posterior lumbar interbody arthrodesis.  During the decompression and insertion of the prosthesis the assistant protected the thecal sac and nerve roots with the D'Errico retractor.  We now  turned attention to the instrumentation. Under  fluoroscopic guidance we cannulated the bilateral L4 and L5 pedicles with the bone probe. We then removed the bone probe. We then tapped the pedicle with a 6.5 millimeter tap. We then removed the tap. We probed inside the tapped pedicle with a ball probe to rule out cortical breaches. We then inserted a 7.5 x 50 and 55 millimeter pedicle screw into the L4 and L5 pedicles bilaterally under fluoroscopic guidance. We then palpated along the medial aspect of the pedicles to rule out cortical breaches. There were none. The nerve roots were not injured. We then connected the unilateral pedicle screws with a lordotic rod. We compressed the construct and secured the rod in place with the caps. We then tightened the caps appropriately. This completed the instrumentation from L4-5 bilaterally.  We now turned our attention to the posterior lateral arthrodesis at L4-5. We used the high-speed drill to decorticate the remainder of the facets, pars, transverse process at L4-5. We then applied a combination of local morselized autograft bone and Zimmer DBM over these decorticated posterior lateral structures. This completed the posterior lateral arthrodesis.  We then obtained hemostasis using bipolar electrocautery. We irrigated the wound out with Betadine solution. We inspected the thecal sac and nerve roots and noted they were well decompressed. We then removed the retractor.  We injected Exparel . We reapproximated patient's thoracolumbar fascia with interrupted #1 Vicryl suture. We reapproximated patient's subcutaneous tissue with interrupted 2-0 Vicryl suture. The reapproximated patient's skin with Steri-Strips and benzoin. The wound was then coated with bacitracin ointment. A sterile dressing was applied. The drapes were removed. The patient was subsequently returned to the supine position where they were extubated by the anesthesia team. He was then transported to the post  anesthesia care unit in stable condition. All sponge instrument and needle counts were reportedly correct at the end of this case.

## 2022-12-08 DIAGNOSIS — M4316 Spondylolisthesis, lumbar region: Secondary | ICD-10-CM | POA: Diagnosis not present

## 2022-12-08 MED ORDER — CYCLOBENZAPRINE HCL 5 MG PO TABS: 5.0000 mg | ORAL_TABLET | Freq: Three times a day (TID) | ORAL | Status: DC | PRN

## 2022-12-08 MED ORDER — DOCUSATE SODIUM 100 MG PO CAPS: 100.0000 mg | ORAL_CAPSULE | Freq: Two times a day (BID) | ORAL | 0 refills | Status: DC

## 2022-12-08 MED ORDER — OXYCODONE-ACETAMINOPHEN 5-325 MG PO TABS: 1.0000 | ORAL_TABLET | ORAL | 0 refills | Status: DC | PRN

## 2022-12-08 MED ORDER — OXYCODONE-ACETAMINOPHEN 5-325 MG PO TABS: 1.0000 | ORAL_TABLET | ORAL | Status: DC | PRN

## 2022-12-08 NOTE — Progress Notes (Signed)
Patient alert and oriented, voiding adequately, MAE well with no difficulty. Incision area cdi with no s/s of infection. Patient discharged home per order. Patient and husband stated understanding of discharge instructions given. Patient has an appointment with Dr. Lovell Sheehan

## 2022-12-08 NOTE — Discharge Summary (Signed)
Physician Discharge Summary  Patient ID: Christine Hart MRN: [id number] DOB/AGE: 1946-05-22 77 y.o.  Admit date: 12/07/2022 Discharge date: 12/08/2022  Admission Diagnoses: Lumbar spondylolisthesis, lumbar spinal stenosis, lumbar facet arthropathy, lumbar radiculopathy, neurogenic claudication, lumbago, lumbar degenerative disease  Discharge Diagnoses: The same Principal Problem:   Spondylolisthesis of lumbar region   Discharged Condition: good  Hospital Course: I performed an L4-5 decompression, instrumentation and fusion on the patient on 12/07/2022.  The surgery went well.  The patient's postoperative course was unremarkable.  On postoperative day #1 she requested discharge to home.  The patient was given verbal and written discharge instructions.  All her questions were answered.  Consults: PT, care management Significant Diagnostic Studies: None Treatments: L4-5 decompression, instrumentation and fusion. Discharge Exam: Blood pressure 137/60, pulse 65, temperature 98.4 F (36.9 C), temperature source Oral, resp. rate 16, height 5\' 6"  (1.676 m), weight 74.8 kg, SpO2 98 %. The patient is alert and pleasant.  She looks well.  Her strength is normal.  Disposition: Home  Discharge Instructions     Call MD for:  difficulty breathing, headache or visual disturbances   Complete by: As directed    Call MD for:  extreme fatigue   Complete by: As directed    Call MD for:  hives   Complete by: As directed    Call MD for:  persistant dizziness or light-headedness   Complete by: As directed    Call MD for:  persistant nausea and vomiting   Complete by: As directed    Call MD for:  redness, tenderness, or signs of infection (pain, swelling, redness, odor or green/yellow discharge around incision site)   Complete by: As directed    Call MD for:  severe uncontrolled pain   Complete by: As directed    Call MD for:  temperature >100.4   Complete by: As directed    Diet - low sodium  heart healthy   Complete by: As directed    Discharge instructions   Complete by: As directed    Call [phone] for a followup appointment. Take a stool softener while you are using pain medications.   Driving Restrictions   Complete by: As directed    Do not drive for 2 weeks.   Increase activity slowly   Complete by: As directed    Lifting restrictions   Complete by: As directed    Do not lift more than 5 pounds. No excessive bending or twisting.   May shower / Bathe   Complete by: As directed    Remove the dressing for 3 days after surgery.  You may shower, but leave the incision alone.   Remove dressing in 48 hours   Complete by: As directed       Allergies as of 12/08/2022   No Known Allergies      Medication List     STOP taking these medications    cyclobenzaprine 10 MG tablet Commonly known as: FLEXERIL   traMADol 50 MG tablet Commonly known as: ULTRAM       TAKE these medications    ALPRAZolam 0.5 MG tablet Commonly known as: XANAX Take 0.5 mg by mouth 3 (three) times daily as needed for anxiety.   azithromycin 250 MG tablet Commonly known as: ZITHROMAX Take 1 tablet (250 mg total) by mouth daily.   benzonatate 200 MG capsule Commonly known as: TESSALON Take 1 capsule (200 mg total) by mouth 3 (three) times daily as needed for cough.   betamethasone dipropionate  0.05 % cream 1 application. 2 (two) times daily as needed (rash).   chlorpheniramine-HYDROcodone 10-8 MG/5ML Suer Commonly known as: TUSSIONEX Take 5 mLs by mouth every 8 (eight) hours as needed for cough.   docusate sodium 100 MG capsule Commonly known as: COLACE Take 1 capsule (100 mg total) by mouth 2 (two) times daily.   gabapentin 100 MG capsule Commonly known as: NEURONTIN Take 100 mg by mouth 3 (three) times daily as needed (for pain). Take 1 to 2 capsules 3 times daily as needed for pain.   metoprolol succinate 50 MG 24 hr tablet Commonly known as: TOPROL-XL Take 50 mg  by mouth daily. Toprol XL increased to 50 mg 2 tablets daily on 12/02/22.   oxyCODONE-acetaminophen 5-325 MG tablet Commonly known as: PERCOCET/ROXICET Take 1-2 tablets by mouth every 4 (four) hours as needed for moderate pain.   prenatal multivitamin Tabs tablet Take 1 tablet by mouth daily at 12 noon.   telmisartan 40 MG tablet Commonly known as: MICARDIS Take 40 mg by mouth daily.        Follow-up Information     Tressie Stalker, MD. Call.   Specialty: Neurosurgery Why: As needed, If symptoms worsen Contact information: [address] [place] [address] [phone]                 Signed: Cristi Loron 12/08/2022, 8:01 AM

## 2022-12-08 NOTE — Evaluation (Signed)
Occupational Therapy Evaluation and Discharge Patient Details Name: Christine Hart MRN: [id number] DOB: Jul 21, 1946 Today's Date: 12/08/2022   History of Present Illness Pt ia a 77 year old woman admitted 5/15 for L 4-5 decompression and fusion. PMH: anxiety, arthritis, skin ca, HTN, MVP.   Clinical Impression   All education completed and reinforced with written handout with husband also present, both verbalized understanding. Pt needs up to supervision for basic ADLs. Husband is willing and able to assist with IADLs as needed. No further OT needs.      Recommendations for follow up therapy are one component of a multi-disciplinary discharge planning process, led by the attending physician.  Recommendations may be updated based on patient status, additional functional criteria and insurance authorization.   Assistance Recommended at Discharge Intermittent Supervision/Assistance  Patient can return home with the following Assistance with cooking/housework;Assist for transportation;Help with stairs or ramp for entrance    Functional Status Assessment  Patient has had a recent decline in their functional status and demonstrates the ability to make significant improvements in function in a reasonable and predictable amount of time.  Equipment Recommendations  None recommended by OT    Recommendations for Other Services       Precautions / Restrictions Precautions Precautions: Back;Fall Precaution Booklet Issued: Yes (comment) Required Braces or Orthoses: Spinal Brace Spinal Brace: Lumbar corset;Applied in sitting position Restrictions Weight Bearing Restrictions: No      Mobility Bed Mobility               General bed mobility comments: seated EOB upon arrival    Transfers Overall transfer level: Needs assistance Equipment used: None Transfers: Sit to/from Stand Sit to Stand: Supervision                  Balance Overall balance assessment: Needs assistance    Sitting balance-Leahy Scale: Good     Standing balance support: No upper extremity supported Standing balance-Leahy Scale: Fair                             ADL either performed or assessed with clinical judgement   ADL Overall ADL's : Needs assistance/impaired Eating/Feeding: Independent   Grooming: Standing;Supervision/safety   Upper Body Bathing: Set up;Sitting   Lower Body Bathing: Supervison/ safety;Sit to/from stand   Upper Body Dressing : Set up;Sitting   Lower Body Dressing: Supervision/safety;Sit to/from stand Lower Body Dressing Details (indicate cue type and reason): can perform figure 4 Toilet Transfer: Supervision/safety;Ambulation   Toileting- Clothing Manipulation and Hygiene: Supervision/safety;Sitting/lateral lean       Functional mobility during ADLs: Supervision/safety General ADL Comments: Educated pt in compensatory strategies for ADLs avoiding bending, lifting and twisting and IADLs to avoid.     Vision Ability to See in Adequate Light: 0 Adequate Patient Visual Report: No change from baseline       Perception     Praxis      Pertinent Vitals/Pain Pain Assessment Pain Assessment: Faces Faces Pain Scale: Hurts a little bit Pain Location: back Pain Descriptors / Indicators: Discomfort, Sore Pain Intervention(s): Monitored during session     Hand Dominance Right   Extremity/Trunk Assessment Upper Extremity Assessment Upper Extremity Assessment: Overall WFL for tasks assessed   Lower Extremity Assessment Lower Extremity Assessment: Defer to PT evaluation   Cervical / Trunk Assessment Cervical / Trunk Assessment: Back Surgery   Communication Communication Communication: HOH   Cognition Arousal/Alertness: Awake/alert Behavior During Therapy: [facility] for tasks  assessed/performed Overall Cognitive Status: Within Functional Limits for tasks assessed                                       General Comments        Exercises     Shoulder Instructions      Home Living Family/patient expects to be discharged to:: Private residence Living Arrangements: Spouse/significant other Available Help at Discharge: Family;Available 24 hours/day Type of Home: House Home Access: Stairs to enter [employer] of Steps: 2 Entrance Stairs-Rails: None Home Layout: One level     Bathroom Shower/Tub: Producer, television/film/video: Handicapped height     Home Equipment: Agricultural consultant (2 wheels);Shower seat          Prior Functioning/Environment Prior Level of Function : Independent/Modified Independent                        OT Problem List:        OT Treatment/Interventions:      OT Goals(Current goals can be found in the care plan section)    OT Frequency:      Co-evaluation              AM-PAC OT "6 Clicks" Daily Activity     Outcome Measure Help from another person eating meals?: None Help from another person taking care of personal grooming?: None Help from another person toileting, which includes using toliet, bedpan, or urinal?: None Help from another person bathing (including washing, rinsing, drying)?: None Help from another person to put on and taking off regular upper body clothing?: None Help from another person to put on and taking off regular lower body clothing?: None 6 Click Score: 24   End of Session Equipment Utilized During Treatment: Rolling walker (2 wheels);Gait belt;Back brace  Activity Tolerance: Patient tolerated treatment well Patient left: in bed;with call bell/phone within reach;with family/visitor present  OT Visit Diagnosis: Unsteadiness on feet (R26.81)                Time: 8119-1478 OT Time Calculation (min): 15 min Charges:  OT General Charges $OT Visit: 1 Visit OT Evaluation $OT Eval Low Complexity: 1 Low  Berna Spare, OTR/L Acute Rehabilitation Services Office: [phone]   Evern Bio 12/08/2022, 9:08 AM

## 2022-12-08 NOTE — Evaluation (Signed)
Physical Therapy Evaluation  Patient Details Name: Christine Hart MRN: [id number] DOB: 10-25-1945 Today's Date: 12/08/2022  History of Present Illness  Pt ia a 77 year old woman admitted 5/15 for L 4-5 decompression and fusion. PMH: anxiety, arthritis, skin ca, HTN, MVP.   Clinical Impression  Pt admitted with above diagnosis. At the time of PT eval, pt was able to demonstrate transfers and ambulation with gross min guard assist and no AD. Pt was educated on precautions, brace application/wearing schedule, appropriate activity progression, and car transfer. Pt currently with functional limitations due to the deficits listed below (see PT Problem List). Pt will benefit from skilled PT to increase their independence and safety with mobility to allow discharge to the venue listed below.         Recommendations for follow up therapy are one component of a multi-disciplinary discharge planning process, led by the attending physician.  Recommendations may be updated based on patient status, additional functional criteria and insurance authorization.  Follow Up Recommendations       Assistance Recommended at Discharge PRN  Patient can return home with the following  A little help with walking and/or transfers;A little help with bathing/dressing/bathroom;Assistance with cooking/housework;Assist for transportation;Help with stairs or ramp for entrance    Equipment Recommendations None recommended by PT  Recommendations for Other Services       Functional Status Assessment Patient has had a recent decline in their functional status and demonstrates the ability to make significant improvements in function in a reasonable and predictable amount of time.     Precautions / Restrictions Precautions Precautions: Back;Fall Precaution Booklet Issued: Yes (comment) Precaution Comments: Reviewed handout and pt was cued for precautions during functional mobility. Required Braces or Orthoses: Spinal  Brace Spinal Brace: Lumbar corset;Applied in sitting position Restrictions Weight Bearing Restrictions: No      Mobility  Bed Mobility Overal bed mobility: Needs Assistance Bed Mobility: Rolling, Sidelying to Sit, Sit to Sidelying Rolling: Supervision Sidelying to sit: Supervision     Sit to sidelying: Supervision General bed mobility comments: VC's for optimal log roll technique. HOB flat and rails lowered to simulate home environment.    Transfers Overall transfer level: Needs assistance Equipment used: None Transfers: Sit to/from Stand Sit to Stand: Supervision           General transfer comment: VC's for hand placement on seated surface for safety. No assist required.    Ambulation/Gait Ambulation/Gait assistance: Min guard Gait Distance (Feet): 500 Feet Assistive device: None Gait Pattern/deviations: Step-through pattern, Decreased stride length, Trunk flexed Gait velocity: Decreased Gait velocity interpretation: 1.31 - 2.62 ft/sec, indicative of limited community ambulator   General Gait Details: VC's for improved posture. Mildly unsteable throughout gait training however no overt LOB noted. Hands on guarding provided for safety.  Stairs Stairs: Yes Stairs assistance: Min assist Stair Management: No rails, Step to pattern, Forwards (HHA on the R from husband) Number of Stairs: 2 General stair comments: Husband present for education. PT instructed in optimal  Wheelchair Mobility    Modified Rankin (Stroke Patients Only)       Balance Overall balance assessment: Needs assistance   Sitting balance-Leahy Scale: Good     Standing balance support: No upper extremity supported Standing balance-Leahy Scale: Fair                               Pertinent Vitals/Pain Pain Assessment Pain Assessment: Faces Faces Pain Scale: Hurts  a little bit Pain Location: back Pain Descriptors / Indicators: Discomfort, Sore Pain Intervention(s): Limited  activity within patient's tolerance, Monitored during session, Repositioned    Home Living Family/patient expects to be discharged to:: Private residence Living Arrangements: Spouse/significant other Available Help at Discharge: Family;Available 24 hours/day Type of Home: House Home Access: Stairs to enter Entrance Stairs-Rails: None Entrance Stairs-Number of Steps: 2   Home Layout: One level Home Equipment: Agricultural consultant (2 wheels);Shower seat      Prior Function Prior Level of Function : Independent/Modified Independent                     Hand Dominance   Dominant Hand: Right    Extremity/Trunk Assessment   Upper Extremity Assessment Upper Extremity Assessment: Defer to OT evaluation    Lower Extremity Assessment Lower Extremity Assessment: Generalized weakness (MIld; consistent with pre-op diagnosis)    Cervical / Trunk Assessment Cervical / Trunk Assessment: Back Surgery  Communication   Communication: HOH  Cognition Arousal/Alertness: Awake/alert Behavior During Therapy: WFL for tasks assessed/performed Overall Cognitive Status: Within Functional Limits for tasks assessed                                          General Comments      Exercises     Assessment/Plan    PT Assessment Patient needs continued PT services  PT Problem List Decreased strength;Decreased activity tolerance;Decreased balance;Decreased mobility;Decreased knowledge of use of DME;Decreased safety awareness;Decreased knowledge of precautions;Pain       PT Treatment Interventions DME instruction;Gait training;Stair training;Functional mobility training;Therapeutic activities;Therapeutic exercise;Balance training;Patient/family education    PT Goals (Current goals can be found in the Care Plan section)  Acute Rehab PT Goals Patient Stated Goal: Home today PT Goal Formulation: With patient/family Time For Goal Achievement: 12/15/22 Potential to Achieve Goals:  Good    Frequency Min 5X/week     Co-evaluation               AM-PAC PT "6 Clicks" Mobility  Outcome Measure Help needed turning from your back to your side while in a flat bed without using bedrails?: A Little Help needed moving from lying on your back to sitting on the side of a flat bed without using bedrails?: A Little Help needed moving to and from a bed to a chair (including a wheelchair)?: A Little Help needed standing up from a chair using your arms (e.g., wheelchair or bedside chair)?: A Little Help needed to walk in hospital room?: A Little Help needed climbing 3-5 steps with a railing? : A Little 6 Click Score: 18    End of Session Equipment Utilized During Treatment: Gait belt;Back brace Activity Tolerance: Patient tolerated treatment well Patient left: in bed;with call bell/phone within reach;with family/visitor present Nurse Communication: Mobility status PT Visit Diagnosis: Unsteadiness on feet (R26.81);Pain Pain - part of body:  (back)    Time: 5409-8119 PT Time Calculation (min) (ACUTE ONLY): 28 min   Charges:   PT Evaluation $PT Eval Low Complexity: 1 Low PT Treatments $Gait Training: 8-22 mins        Conni Slipper, PT, DPT Acute Rehabilitation Services Secure Chat Preferred Office: [phone]   Marylynn Pearson 12/08/2022, 10:32 AM

## 2022-12-14 MED FILL — Sodium Chloride IV Soln 0.9%: INTRAVENOUS | Qty: 1000 | Status: AC

## 2022-12-14 MED FILL — Heparin Sodium (Porcine) Inj 1000 Unit/ML: INTRAMUSCULAR | Qty: 30 | Status: AC

## 2022-12-15 DIAGNOSIS — M4316 Spondylolisthesis, lumbar region: Secondary | ICD-10-CM | POA: Diagnosis not present

## 2023-03-01 DIAGNOSIS — M48061 Spinal stenosis, lumbar region without neurogenic claudication: Secondary | ICD-10-CM | POA: Diagnosis not present

## 2023-03-01 DIAGNOSIS — M545 Low back pain, unspecified: Secondary | ICD-10-CM | POA: Diagnosis not present

## 2023-03-01 DIAGNOSIS — M4316 Spondylolisthesis, lumbar region: Secondary | ICD-10-CM | POA: Diagnosis not present

## 2023-03-01 DIAGNOSIS — M4807 Spinal stenosis, lumbosacral region: Secondary | ICD-10-CM | POA: Diagnosis not present

## 2023-03-17 DIAGNOSIS — M5417 Radiculopathy, lumbosacral region: Secondary | ICD-10-CM | POA: Diagnosis not present

## 2023-03-17 DIAGNOSIS — M48062 Spinal stenosis, lumbar region with neurogenic claudication: Secondary | ICD-10-CM | POA: Diagnosis not present

## 2023-03-30 DIAGNOSIS — M5417 Radiculopathy, lumbosacral region: Secondary | ICD-10-CM | POA: Diagnosis not present

## 2023-03-30 DIAGNOSIS — M48062 Spinal stenosis, lumbar region with neurogenic claudication: Secondary | ICD-10-CM | POA: Diagnosis not present

## 2023-04-10 DIAGNOSIS — L814 Other melanin hyperpigmentation: Secondary | ICD-10-CM | POA: Diagnosis not present

## 2023-04-10 DIAGNOSIS — D485 Neoplasm of uncertain behavior of skin: Secondary | ICD-10-CM | POA: Diagnosis not present

## 2023-06-09 DIAGNOSIS — Z6827 Body mass index (BMI) 27.0-27.9, adult: Secondary | ICD-10-CM | POA: Diagnosis not present

## 2023-06-09 DIAGNOSIS — M5417 Radiculopathy, lumbosacral region: Secondary | ICD-10-CM | POA: Diagnosis not present

## 2023-06-12 DIAGNOSIS — I10 Essential (primary) hypertension: Secondary | ICD-10-CM | POA: Diagnosis not present

## 2023-06-12 DIAGNOSIS — F411 Generalized anxiety disorder: Secondary | ICD-10-CM | POA: Diagnosis not present

## 2023-06-12 DIAGNOSIS — Z6828 Body mass index (BMI) 28.0-28.9, adult: Secondary | ICD-10-CM | POA: Diagnosis not present

## 2023-07-04 DIAGNOSIS — R0989 Other specified symptoms and signs involving the circulatory and respiratory systems: Secondary | ICD-10-CM | POA: Diagnosis not present

## 2023-07-04 DIAGNOSIS — F331 Major depressive disorder, recurrent, moderate: Secondary | ICD-10-CM | POA: Diagnosis not present

## 2023-07-04 DIAGNOSIS — Z6829 Body mass index (BMI) 29.0-29.9, adult: Secondary | ICD-10-CM | POA: Diagnosis not present

## 2023-07-04 DIAGNOSIS — F411 Generalized anxiety disorder: Secondary | ICD-10-CM | POA: Diagnosis not present

## 2023-07-04 DIAGNOSIS — E663 Overweight: Secondary | ICD-10-CM | POA: Diagnosis not present

## 2023-07-04 DIAGNOSIS — M1991 Primary osteoarthritis, unspecified site: Secondary | ICD-10-CM | POA: Diagnosis not present

## 2023-07-04 DIAGNOSIS — I10 Essential (primary) hypertension: Secondary | ICD-10-CM | POA: Diagnosis not present

## 2023-07-25 IMAGING — DX DG CHEST 2V
2 series · 2 of 2 positions shown · non-contrast
Comparison: None.

CLINICAL DATA: Cough, congestion, fever

EXAM:
CHEST - 2 VIEW

[chest pa]
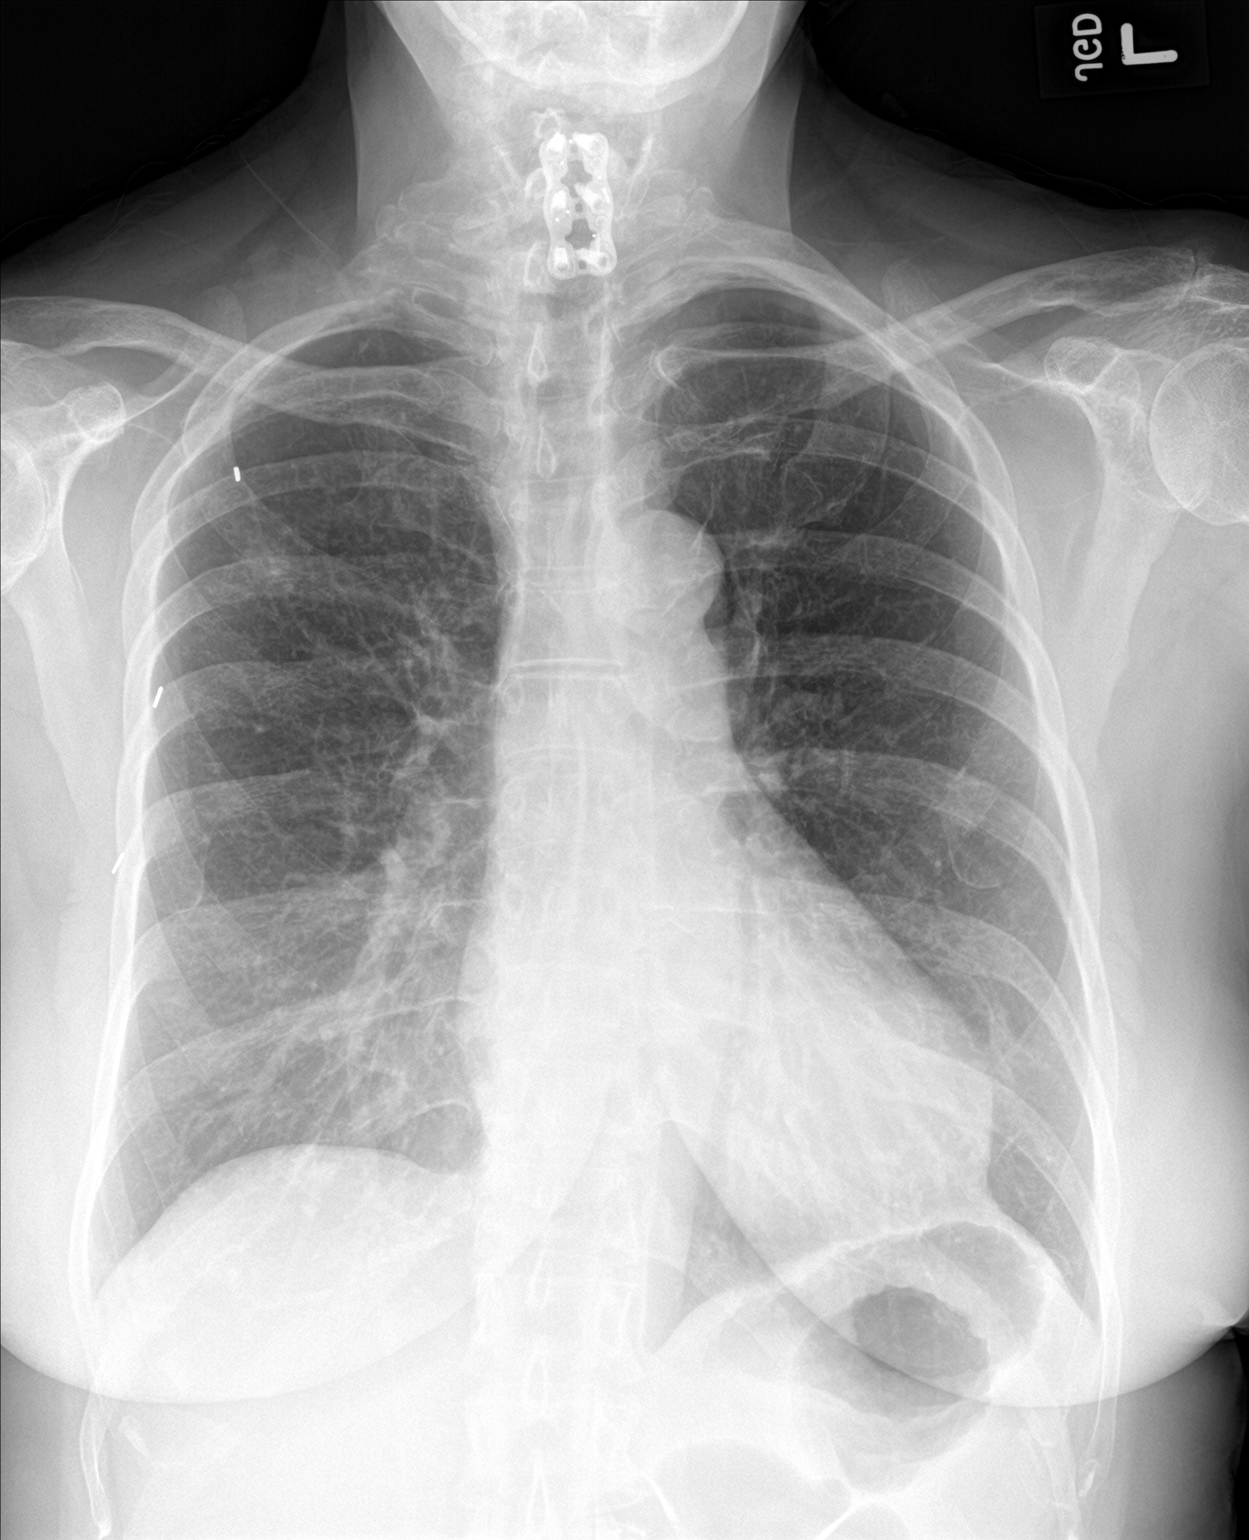

[chest lat]
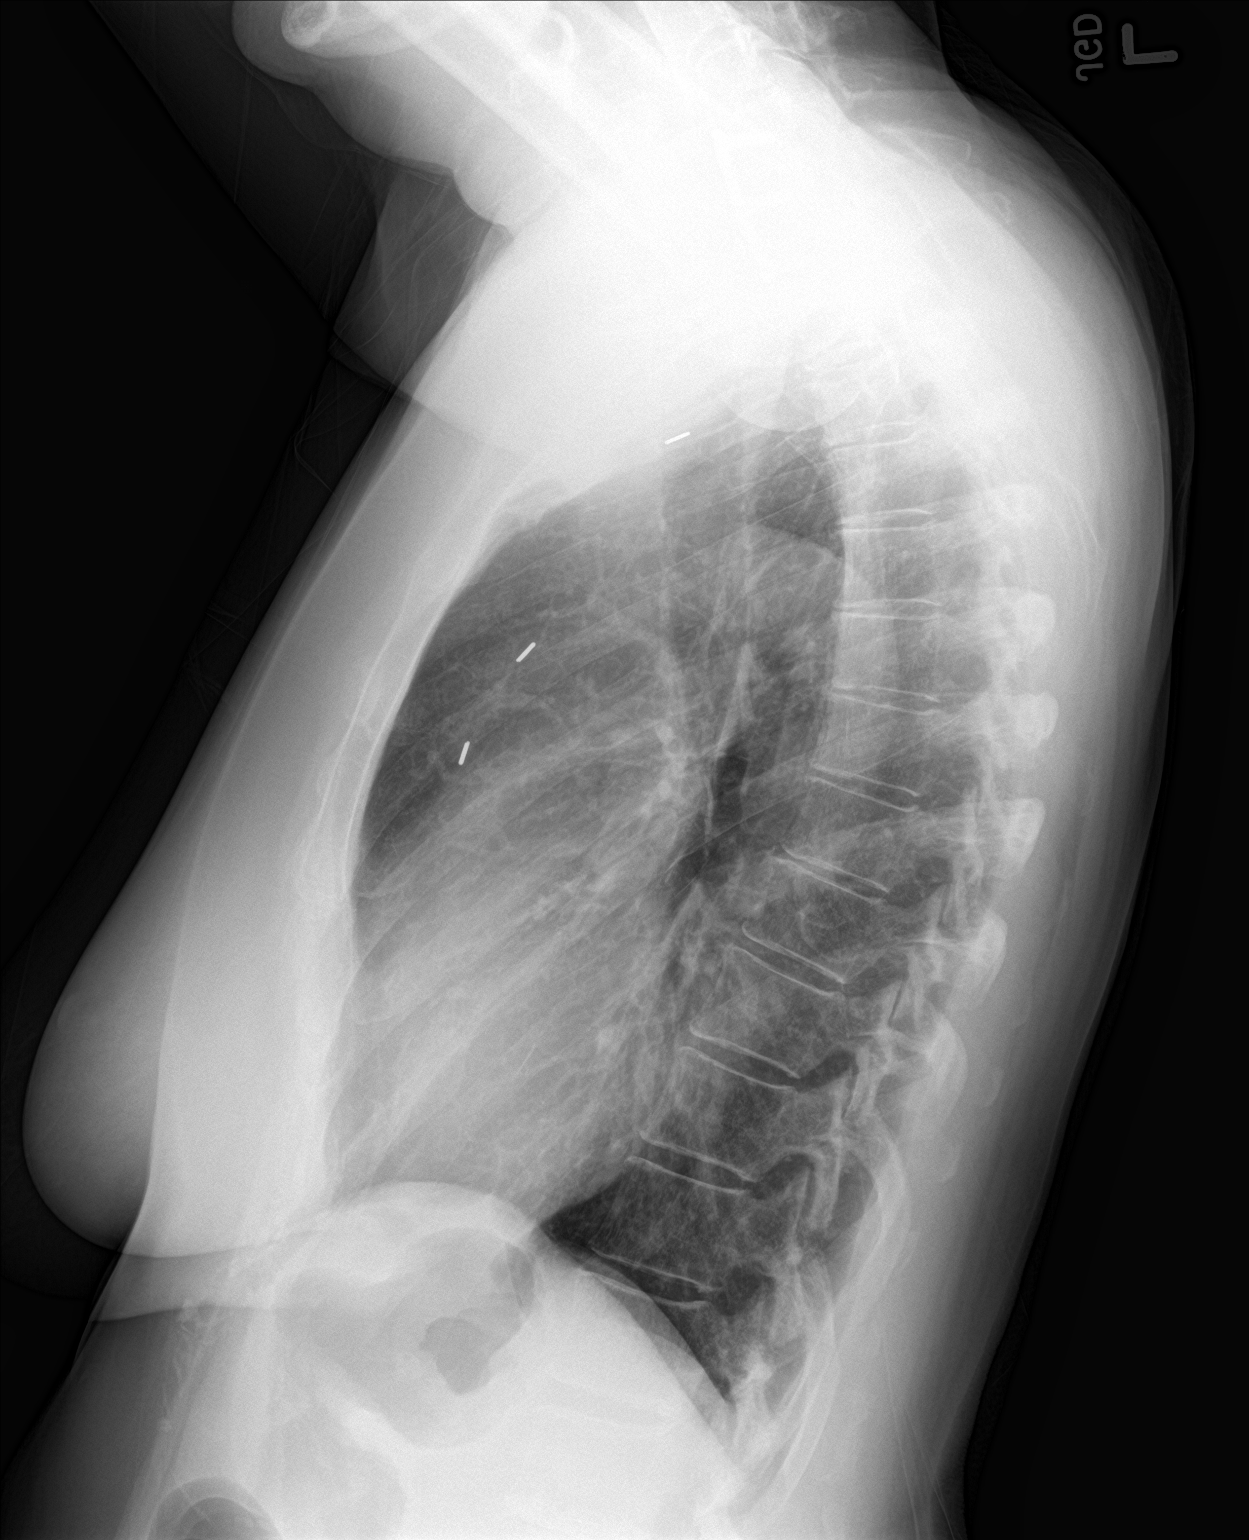

[2 of 2 positions shown; findings below may reference images not displayed]

FINDINGS: The heart size and mediastinal contours are within normal limits.
Both lungs are clear. The visualized skeletal structures are
unremarkable.
IMPRESSION: No active cardiopulmonary disease.

## 2023-07-28 IMAGING — DX DG CHEST 1V PORT
1 series · 1 of 1 positions shown · non-contrast
Comparison: 05/23/2021

CLINICAL DATA: Cough, shortness of breath

EXAM:
PORTABLE CHEST 1 VIEW

[chest ap]
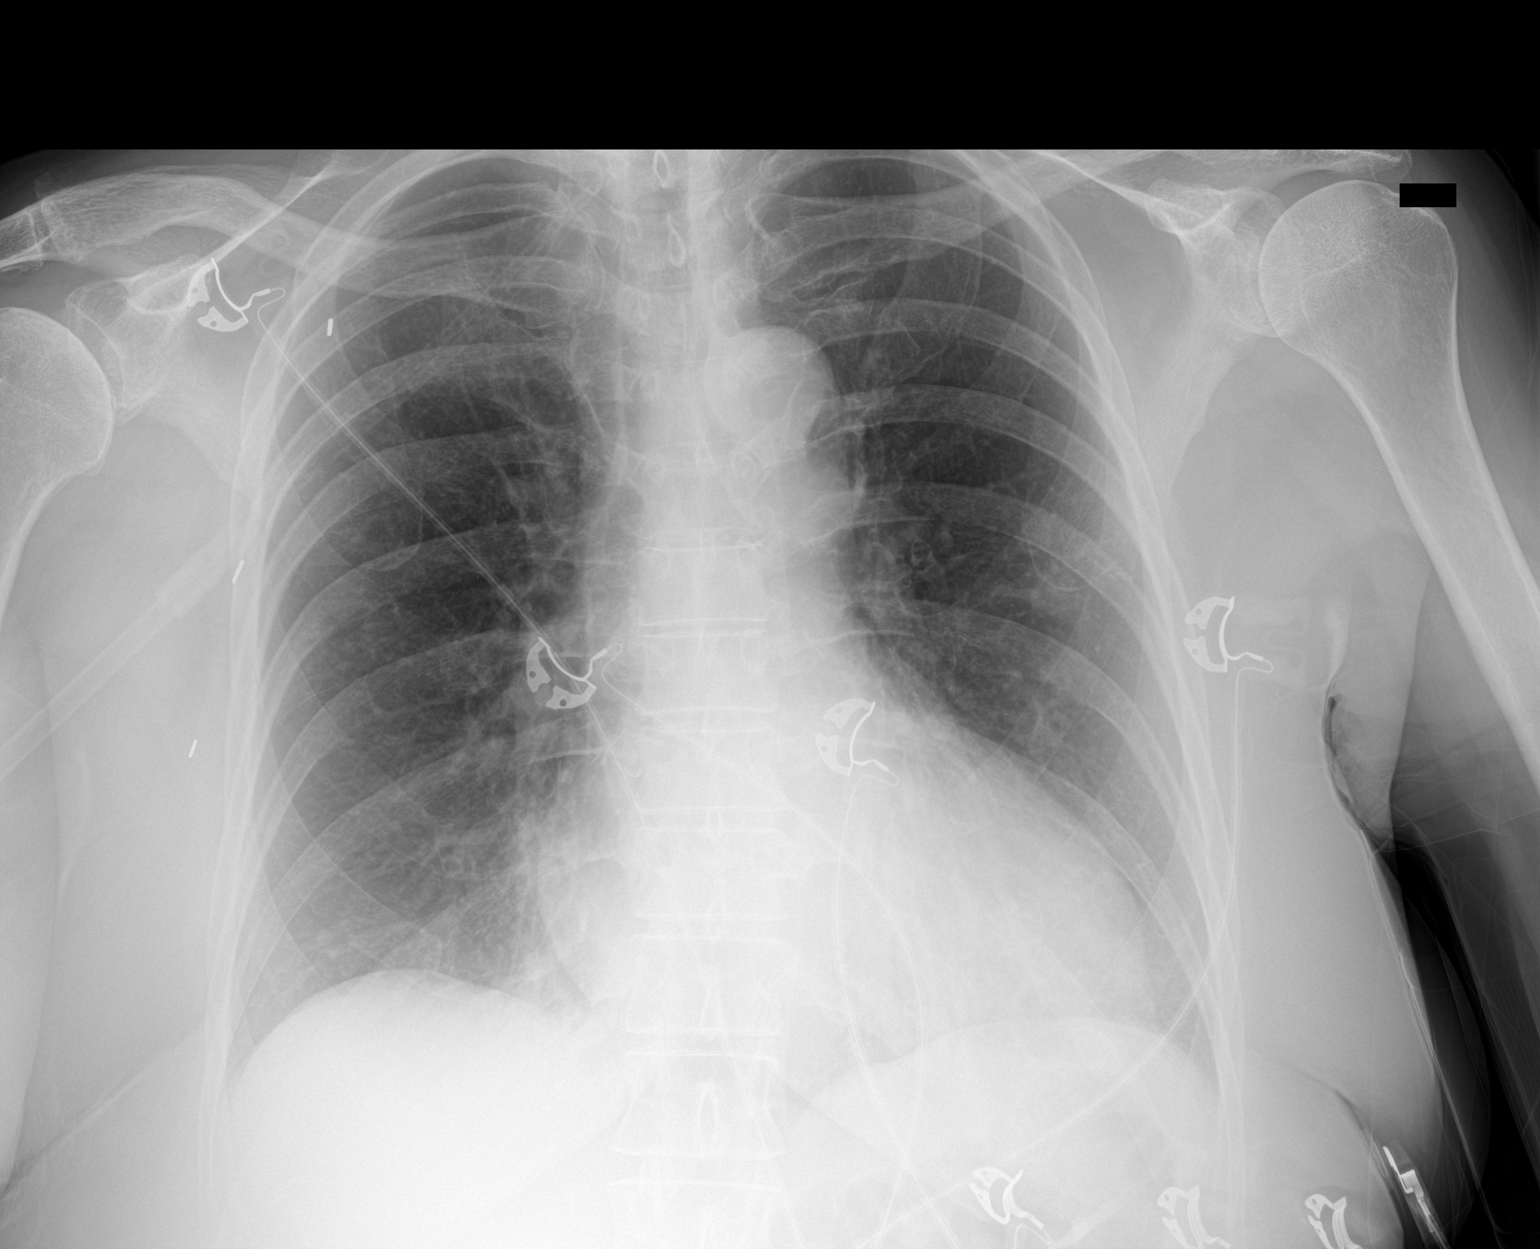

[1 of 1 positions shown; findings below may reference images not displayed]

FINDINGS: Unchanged cardiomediastinal silhouette. There are bilateral medial
basilar opacities. No large pleural effusion or visible
pneumothorax. There is no acute osseous abnormality. Partially
visualized cervical spine fusion hardware.
IMPRESSION: Faint bilateral medial basilar opacities which could be atelectasis
or developing infection.

## 2023-08-01 DIAGNOSIS — M5417 Radiculopathy, lumbosacral region: Secondary | ICD-10-CM | POA: Diagnosis not present

## 2023-09-07 ENCOUNTER — Ambulatory Visit (HOSPITAL_BASED_OUTPATIENT_CLINIC_OR_DEPARTMENT_OTHER)
Admission: EM | Admit: 2023-09-07 | Discharge: 2023-09-07 | Disposition: A | Discharge status: Home / Self Care | Payer: PPO

## 2023-09-07 ENCOUNTER — Encounter (HOSPITAL_BASED_OUTPATIENT_CLINIC_OR_DEPARTMENT_OTHER): Payer: Self-pay | Admitting: Emergency Medicine

## 2023-09-07 DIAGNOSIS — H6121 Impacted cerumen, right ear: Secondary | ICD-10-CM

## 2023-09-07 DIAGNOSIS — H60501 Unspecified acute noninfective otitis externa, right ear: Secondary | ICD-10-CM

## 2023-09-07 MED ORDER — CIPRO HC 0.2-1 % OT SUSP: 3.0000 [drp] | Freq: Two times a day (BID) | OTIC | 0 refills | Status: DC

## 2023-09-07 NOTE — Discharge Instructions (Addendum)
Ear lavage done a successful.  There was some erythema and bleeding of the right ear canal after the lavage.  Will treat with Cipro 2 eardrops, 2 drops into the right ear twice daily for 5 days otitis externa.  Patient tolerated the procedure well.  Educated about prevention of earwax.  Follow-up here as needed.

## 2023-09-07 NOTE — ED Provider Notes (Signed)
[name]    CSN: [id number] Arrival date & time: 09/07/23  1414      History   Chief Complaint Chief Complaint  Patient presents with   Ear Fullness    HPI Christine Hart is a 78 y.o. female.   Pt went for a hearing test today and was told that her right ear is full of wax. Pt reports she did a ear wash on Monday but nothing came out.      Ear Fullness Pertinent negatives include no chest pain, no abdominal pain and no shortness of breath.    Past Medical History:  Diagnosis Date   Anxiety    Arthritis    Cancer (HCC)    basal and squamous cell carcinoma. Lesion excisions in 2023.   Complication of anesthesia    Limited mouth opening and neck mobility, s/p TMJ surgeries    Hypertension    MVP (mitral valve prolapse)    Pneumonia 2023   Hospitalized for 2 nights, [place].   PONV (postoperative nausea and vomiting)     Patient Active Problem List   Diagnosis Date Noted   Spondylolisthesis of lumbar region 12/07/2022   Sepsis (HCC) 10/29/2021   Anxiety 10/29/2021   Hyponatremia 10/29/2021   Influenza A 05/26/2021   Hypertension    Acute respiratory failure with hypoxia (HCC)    Arthropathy of cervical facet joint 02/16/2017    Past Surgical History:  Procedure Laterality Date   ABDOMINAL HYSTERECTOMY     APPENDECTOMY     CERVICAL SPINE SURGERY     FIRST RIB REMOVAL Right    HAND SURGERY Bilateral    NEUROMA SURGERY Left    POSTERIOR CERVICAL FUSION/FORAMINOTOMY N/A 02/16/2017   Procedure: CERVICAL 1- CERVICAL 2 POSTERIOR INSTRUMENTATION AND FUSION;  Surgeon: Tressie Stalker, MD;  Location: [facility] OR;  Service: Neurosurgery;  Laterality: N/A;  CERVICAL 1- CERVICAL 2 POSTERIOR INSTRUMENTATION AND FUSION   SQUAMOUS CELL CARCINOMA EXCISION  2023   2 on right leg and 1 on nose.   TMJ ARTHROPLASTY      OB History   No obstetric history on file.      Home Medications    Prior to Admission medications   Medication Sig Start Date End  Date Taking? Authorizing Provider  amLODipine (NORVASC) 5 MG tablet Take 5 mg by mouth daily. 07/09/23  Yes [provider]  ciprofloxacin-hydrocortisone (CIPRO HC) OTIC suspension Place 3 drops into the right ear 2 (two) times daily for 5 days. 09/07/23 09/12/23 Yes Prescilla Sours, FNP  metoprolol succinate (TOPROL-XL) 50 MG 24 hr tablet Take 50 mg by mouth daily. Toprol XL increased to 50 mg 2 tablets daily on 12/02/22. 03/14/21  Yes [provider]  telmisartan (MICARDIS) 40 MG tablet Take 40 mg by mouth daily. 02/14/21  Yes [provider]  ALPRAZolam Prudy Feeler) 0.5 MG tablet Take 0.5 mg by mouth 3 (three) times daily as needed for anxiety. 05/14/21   [provider]  azithromycin (ZITHROMAX) 250 MG tablet Take 1 tablet (250 mg total) by mouth daily. Patient not taking: Reported on 11/30/2022 10/30/21   Sunnie Nielsen, DO  benzonatate (TESSALON) 200 MG capsule Take 1 capsule (200 mg total) by mouth 3 (three) times daily as needed for cough. Patient not taking: Reported on 11/30/2022 10/30/21   Sunnie Nielsen, DO  betamethasone dipropionate 0.05 % cream 1 application. 2 (two) times daily as needed (rash). Patient not taking: Reported on 11/30/2022 09/16/21   [provider]  chlorpheniramine-HYDROcodone (  TUSSIONEX) 10-8 MG/5ML SUER Take 5 mLs by mouth every 8 (eight) hours as needed for cough. Patient not taking: Reported on 11/30/2022 10/30/21   Sunnie Nielsen, DO  docusate sodium (COLACE) 100 MG capsule Take 1 capsule (100 mg total) by mouth 2 (two) times daily. 12/08/22   Tressie Stalker, MD  gabapentin (NEURONTIN) 100 MG capsule Take 100 mg by mouth 3 (three) times daily as needed (for pain). Take 1 to 2 capsules 3 times daily as needed for pain.    [provider]  oxyCODONE-acetaminophen (PERCOCET/ROXICET) 5-325 MG tablet Take 1-2 tablets by mouth every 4 (four) hours as needed for moderate pain. 12/08/22   Tressie Stalker, MD  Prenatal Vit-Fe  Fumarate-FA (PRENATAL MULTIVITAMIN) TABS tablet Take 1 tablet by mouth daily at 12 noon.    [provider]    Family History History reviewed. No pertinent family history.  Social History Social History   Tobacco Use   Smoking status: Never   Smokeless tobacco: Never  Vaping Use   Vaping status: Never Used  Substance Use Topics   Alcohol use: Yes    Alcohol/week: 3.0 standard drinks of alcohol    Types: 3 Shots of liquor per week    Comment: 3 drinks a week.   Drug use: No     Allergies   Patient has no known allergies.   Review of Systems Review of Systems  Constitutional:  Negative for chills and fever.  HENT:  Positive for ear discharge (Right cerumen impaction). Negative for ear pain and sore throat.   Eyes:  Negative for pain and visual disturbance.  Respiratory:  Negative for cough and shortness of breath.   Cardiovascular:  Negative for chest pain and palpitations.  Gastrointestinal:  Negative for abdominal pain and vomiting.  Genitourinary:  Negative for dysuria and hematuria.  Musculoskeletal:  Negative for arthralgias and back pain.  Skin:  Negative for color change and rash.  Neurological:  Negative for seizures and syncope.  All other systems reviewed and are negative.    Physical Exam Triage Vital Signs ED Triage Vitals  Encounter Vitals Group     BP 09/07/23 1420 (!) 169/85     Systolic BP Percentile --      Diastolic BP Percentile --      Pulse Rate 09/07/23 1420 73     Resp 09/07/23 1420 18     Temp 09/07/23 1420 97.8 F (36.6 C)     Temp Source 09/07/23 1420 Oral     SpO2 09/07/23 1420 97 %     Weight --      Height --      Head Circumference --      Peak Flow --      Pain Score 09/07/23 1419 0     Pain Loc --      Pain Education --      Exclude from Growth Chart --    No data found.  Updated Vital Signs BP (!) 169/85 (BP Location: Right Arm)   Pulse 73   Temp 97.8 F (36.6 C) (Oral)   Resp 18   SpO2 97%   Visual  Acuity Right Eye Distance:   Left Eye Distance:   Bilateral Distance:    Right Eye Near:   Left Eye Near:    Bilateral Near:     Physical Exam Vitals and nursing note reviewed.  Constitutional:      General: She is not in acute distress.    Appearance: She is  well-developed. She is not ill-appearing.  HENT:     Head: Normocephalic and atraumatic.     Right Ear: There is impacted cerumen (Complete impaction with obstruction of canal.  TM is not visible.).     Left Ear: Hearing, tympanic membrane, ear canal and external ear normal.     Ears:     Comments: Reassessment after ear lavage on the right: Wax is gone.  Some erythema and bleeding of the tissue after the ear lavage.    Nose: Nose normal.     Mouth/Throat:     Lips: Pink.     Mouth: Mucous membranes are moist.  Eyes:     Conjunctiva/sclera: Conjunctivae normal.     Pupils: Pupils are equal, round, and reactive to light.  Cardiovascular:     Rate and Rhythm: Normal rate and regular rhythm.     Heart sounds: S1 normal and S2 normal. No murmur heard. Pulmonary:     Effort: Pulmonary effort is normal. No respiratory distress.     Breath sounds: Normal breath sounds. No decreased breath sounds, wheezing, rhonchi or rales.  Abdominal:     Palpations: Abdomen is soft.     Tenderness: There is no abdominal tenderness.  Musculoskeletal:        General: No swelling.     Cervical back: Neck supple.  Skin:    General: Skin is warm and dry.     Capillary Refill: Capillary refill takes less than 2 seconds.     Findings: No rash.  Neurological:     Mental Status: She is alert and oriented to person, place, and time.  Psychiatric:        Mood and Affect: Mood normal.      UC Treatments / Results  Labs (all labs ordered are listed, but only abnormal results are displayed) Labs Reviewed - No data to display  EKG   Radiology No results found.  Procedures Procedures (including critical care time)  Medications Ordered  in UC Medications - No data to display  Initial Impression / Assessment and Plan / UC Course  I have reviewed the triage vital signs and the nursing notes.  Pertinent labs & imaging results that were available during my care of the patient were reviewed by me and considered in my medical decision making (see chart for details).     Ear lavage successful.  Minimal erythema right ear canal with some bleeding after repair lavage.  All wax was removed.  Canal is tender.  Will treat with Cipro otic drops, 3 drops into the right ear twice daily for up to 5 days.    Patient tolerated the procedure well.  Educated about prevention of earwax.  Follow-up here as needed. Final Clinical Impressions(s) / UC Diagnoses   Final diagnoses:  Hearing loss of right ear due to cerumen impaction  Acute otitis externa of right ear, unspecified type     Discharge Instructions      Ear lavage done a successful.  There was some erythema and bleeding of the right ear canal after the lavage.  Will treat with Cipro 2 eardrops, 2 drops into the right ear twice daily for 5 days otitis externa.  Patient tolerated the procedure well.  Educated about prevention of earwax.  Follow-up here as needed.     ED Prescriptions     Medication Sig Dispense Auth. Provider   ciprofloxacin-hydrocortisone (CIPRO HC) OTIC suspension Place 3 drops into the right ear 2 (two) times daily for 5 days.  10 mL Prescilla Sours, FNP      PDMP not reviewed this encounter.   Prescilla Sours, FNP 09/07/23 [phone]

## 2023-09-07 NOTE — ED Triage Notes (Signed)
Pt went for a hearing test today and was told that her right ear is full of wax. Pt reports she did a ear wash on Monday but nothing came out.

## 2023-09-08 ENCOUNTER — Telehealth (HOSPITAL_BASED_OUTPATIENT_CLINIC_OR_DEPARTMENT_OTHER): Payer: Self-pay | Admitting: Emergency Medicine

## 2023-09-08 MED ORDER — NEOMYCIN-POLYMYXIN-HC 3.5-10000-1 OT SUSP: 4.0000 [drp] | Freq: Three times a day (TID) | OTIC | 0 refills | Status: AC

## 2023-09-08 NOTE — Telephone Encounter (Signed)
Patient seen yesterday by different provider Was prescribed cipro-hydrocortisone ear drops after wax removal. Patient called stating too expensive. I have cancelled the original prescription and instead sent the Cortisporin ear drops which are more affordable.  See original provider note for full MDM

## 2023-09-18 DIAGNOSIS — M1991 Primary osteoarthritis, unspecified site: Secondary | ICD-10-CM | POA: Diagnosis not present

## 2023-09-18 DIAGNOSIS — M542 Cervicalgia: Secondary | ICD-10-CM | POA: Diagnosis not present

## 2023-09-18 DIAGNOSIS — M8580 Other specified disorders of bone density and structure, unspecified site: Secondary | ICD-10-CM | POA: Diagnosis not present

## 2023-09-18 DIAGNOSIS — H60393 Other infective otitis externa, bilateral: Secondary | ICD-10-CM | POA: Diagnosis not present

## 2023-09-18 DIAGNOSIS — I10 Essential (primary) hypertension: Secondary | ICD-10-CM | POA: Diagnosis not present

## 2023-09-18 DIAGNOSIS — Z0000 Encounter for general adult medical examination without abnormal findings: Secondary | ICD-10-CM | POA: Diagnosis not present

## 2023-09-18 DIAGNOSIS — Z79899 Other long term (current) drug therapy: Secondary | ICD-10-CM | POA: Diagnosis not present

## 2023-09-18 DIAGNOSIS — M4727 Other spondylosis with radiculopathy, lumbosacral region: Secondary | ICD-10-CM | POA: Diagnosis not present

## 2023-09-18 DIAGNOSIS — E559 Vitamin D deficiency, unspecified: Secondary | ICD-10-CM | POA: Diagnosis not present

## 2023-09-18 DIAGNOSIS — R0989 Other specified symptoms and signs involving the circulatory and respiratory systems: Secondary | ICD-10-CM | POA: Diagnosis not present

## 2023-09-18 DIAGNOSIS — E782 Mixed hyperlipidemia: Secondary | ICD-10-CM | POA: Diagnosis not present

## 2023-09-18 DIAGNOSIS — K582 Mixed irritable bowel syndrome: Secondary | ICD-10-CM | POA: Diagnosis not present

## 2023-09-26 DIAGNOSIS — M47817 Spondylosis without myelopathy or radiculopathy, lumbosacral region: Secondary | ICD-10-CM | POA: Diagnosis not present

## 2023-09-26 DIAGNOSIS — M4317 Spondylolisthesis, lumbosacral region: Secondary | ICD-10-CM | POA: Diagnosis not present

## 2023-10-03 DIAGNOSIS — C44729 Squamous cell carcinoma of skin of left lower limb, including hip: Secondary | ICD-10-CM | POA: Diagnosis not present

## 2023-10-03 DIAGNOSIS — L578 Other skin changes due to chronic exposure to nonionizing radiation: Secondary | ICD-10-CM | POA: Diagnosis not present

## 2023-10-03 DIAGNOSIS — C44319 Basal cell carcinoma of skin of other parts of face: Secondary | ICD-10-CM | POA: Diagnosis not present

## 2023-10-03 DIAGNOSIS — L821 Other seborrheic keratosis: Secondary | ICD-10-CM | POA: Diagnosis not present

## 2023-10-05 ENCOUNTER — Encounter (HOSPITAL_BASED_OUTPATIENT_CLINIC_OR_DEPARTMENT_OTHER): Payer: Self-pay | Admitting: Emergency Medicine

## 2023-10-05 ENCOUNTER — Ambulatory Visit (HOSPITAL_BASED_OUTPATIENT_CLINIC_OR_DEPARTMENT_OTHER)
Admission: EM | Admit: 2023-10-05 | Discharge: 2023-10-05 | Disposition: A | Discharge status: Home / Self Care | Attending: Family Medicine | Admitting: Family Medicine

## 2023-10-05 DIAGNOSIS — H9201 Otalgia, right ear: Secondary | ICD-10-CM

## 2023-10-05 DIAGNOSIS — H6123 Impacted cerumen, bilateral: Secondary | ICD-10-CM | POA: Diagnosis not present

## 2023-10-05 NOTE — ED Provider Notes (Signed)
 [facility]    CSN: [id number] Arrival date & time: 10/05/23  0956      History   Chief Complaint No chief complaint on file.   HPI Christine Hart is a 78 y.o. female.   The patient was seen on 09/07/2023.  She had had exams for hearing aids and was told she had a lot of wax in both ears.  She came here and we cleaned out her ears but in the process she had some irritation and even some bleeding on the right.  She was treated for otitis externa with Cipro drops.  It seemed to get a little better but then her hearing got worse not better on the right.  She saw her family doctor  (on 09/18/23) and he told her she had some kind of a scab or crust on her eardrum.  She was prescribed more eardrops but feels like they did not help very much at all.  She really felt like she could not hear at all on the right and that is normally her good ear and her left ear is her bad ear.  She was messing with her ear in the last 2 days and feels like she has some improvement in hearing on the right but still it is not good.  She is here for [place] to check her ears and clean them if needed.     Past Medical History:  Diagnosis Date   Anxiety    Arthritis    Cancer (HCC)    basal and squamous cell carcinoma. Lesion excisions in 2023.   Complication of anesthesia    Limited mouth opening and neck mobility, s/p TMJ surgeries    Hypertension    MVP (mitral valve prolapse)    Pneumonia 2023   Hospitalized for 2 nights, [place].   PONV (postoperative nausea and vomiting)     Patient Active Problem List   Diagnosis Date Noted   Spondylolisthesis of lumbar region 12/07/2022   Sepsis (HCC) 10/29/2021   Anxiety 10/29/2021   Hyponatremia 10/29/2021   Influenza A 05/26/2021   Hypertension    Acute respiratory failure with hypoxia (HCC)    Arthropathy of cervical facet joint 02/16/2017    Past Surgical History:  Procedure Laterality Date   ABDOMINAL HYSTERECTOMY     APPENDECTOMY      CERVICAL SPINE SURGERY     FIRST RIB REMOVAL Right    HAND SURGERY Bilateral    NEUROMA SURGERY Left    POSTERIOR CERVICAL FUSION/FORAMINOTOMY N/A 02/16/2017   Procedure: CERVICAL 1- CERVICAL 2 POSTERIOR INSTRUMENTATION AND FUSION;  Surgeon: Tressie Stalker, MD;  Location: [facility] OR;  Service: Neurosurgery;  Laterality: N/A;  CERVICAL 1- CERVICAL 2 POSTERIOR INSTRUMENTATION AND FUSION   SQUAMOUS CELL CARCINOMA EXCISION  2023   2 on right leg and 1 on nose.   TMJ ARTHROPLASTY      OB History   No obstetric history on file.      Home Medications    Prior to Admission medications   Medication Sig Start Date End Date Taking? Authorizing Provider  ALPRAZolam Prudy Feeler) 0.5 MG tablet Take 0.5 mg by mouth 3 (three) times daily as needed for anxiety. 05/14/21   [provider]  amLODipine (NORVASC) 5 MG tablet Take 5 mg by mouth daily. 07/09/23   [provider]  azithromycin (ZITHROMAX) 250 MG tablet Take 1 tablet (250 mg total) by mouth daily. Patient not taking: Reported on 11/30/2022 10/30/21   Sunnie Nielsen, DO  benzonatate (TESSALON) 200 MG capsule Take 1 capsule (200 mg total) by mouth 3 (three) times daily as needed for cough. Patient not taking: Reported on 11/30/2022 10/30/21   Sunnie Nielsen, DO  betamethasone dipropionate 0.05 % cream 1 application. 2 (two) times daily as needed (rash). Patient not taking: Reported on 11/30/2022 09/16/21   [provider]  chlorpheniramine-HYDROcodone (TUSSIONEX) 10-8 MG/5ML SUER Take 5 mLs by mouth every 8 (eight) hours as needed for cough. Patient not taking: Reported on 11/30/2022 10/30/21   Sunnie Nielsen, DO  docusate sodium (COLACE) 100 MG capsule Take 1 capsule (100 mg total) by mouth 2 (two) times daily. 12/08/22   Tressie Stalker, MD  gabapentin (NEURONTIN) 100 MG capsule Take 100 mg by mouth 3 (three) times daily as needed (for pain). Take 1 to 2 capsules 3 times daily as needed for pain.    [provider]   metoprolol succinate (TOPROL-XL) 50 MG 24 hr tablet Take 50 mg by mouth daily. Toprol XL increased to 50 mg 2 tablets daily on 12/02/22. 03/14/21   [provider]  oxyCODONE-acetaminophen (PERCOCET/ROXICET) 5-325 MG tablet Take 1-2 tablets by mouth every 4 (four) hours as needed for moderate pain. 12/08/22   Tressie Stalker, MD  Prenatal Vit-Fe Fumarate-FA (PRENATAL MULTIVITAMIN) TABS tablet Take 1 tablet by mouth daily at 12 noon.    [provider]  telmisartan (MICARDIS) 40 MG tablet Take 40 mg by mouth daily. 02/14/21   [provider]    Family History History reviewed. No pertinent family history.  Social History Social History   Tobacco Use   Smoking status: Never   Smokeless tobacco: Never  Vaping Use   Vaping status: Never Used  Substance Use Topics   Alcohol use: Yes    Alcohol/week: 3.0 standard drinks of alcohol    Types: 3 Shots of liquor per week    Comment: 3 drinks a week.   Drug use: No     Allergies   Patient has no known allergies.   Review of Systems Review of Systems  Constitutional:  Negative for chills and fever.  HENT:  Positive for ear pain and hearing loss. Negative for sore throat.   Eyes:  Negative for pain and visual disturbance.  Respiratory:  Negative for cough and shortness of breath.   Cardiovascular:  Negative for chest pain and palpitations.  Gastrointestinal:  Negative for abdominal pain, constipation, diarrhea, nausea and vomiting.  Genitourinary:  Negative for dysuria and hematuria.  Musculoskeletal:  Negative for arthralgias and back pain.  Skin:  Negative for color change and rash.  Neurological:  Negative for seizures and syncope.  All other systems reviewed and are negative.    Physical Exam Triage Vital Signs ED Triage Vitals  Encounter Vitals Group     BP 10/05/23 1004 (!) 177/102     Systolic BP Percentile --      Diastolic BP Percentile --      Pulse Rate 10/05/23 1004 66     Resp 10/05/23  1004 18     Temp 10/05/23 1004 (!) 97.3 F (36.3 C)     Temp Source 10/05/23 1004 Oral     SpO2 10/05/23 1004 96 %     Weight --      Height --      Head Circumference --      Peak Flow --      Pain Score 10/05/23 1003 0     Pain Loc --  Pain Education --      Exclude from Growth Chart --    No data found.  Updated Vital Signs BP (!) 177/102 (BP Location: Right Arm)   Pulse 66   Temp (!) 97.3 F (36.3 C) (Oral)   Resp 18   SpO2 96%   Visual Acuity Right Eye Distance:   Left Eye Distance:   Bilateral Distance:    Right Eye Near:   Left Eye Near:    Bilateral Near:     Physical Exam Vitals and nursing note reviewed.  Constitutional:      General: She is not in acute distress.    Appearance: She is well-developed. She is not ill-appearing or toxic-appearing.  HENT:     Head: Normocephalic and atraumatic.     Right Ear: Hearing and external ear normal. There is impacted cerumen.     Left Ear: Hearing and external ear normal. There is impacted cerumen.     Ears:     Comments: Reassessment after ear lavage: Ears are clear.  Minimal scabbing on the ear canal from previous abrasion.  TMs are gray and normal.  Hearing has improved.    Nose: No congestion or rhinorrhea.     Right Sinus: No maxillary sinus tenderness or frontal sinus tenderness.     Left Sinus: No maxillary sinus tenderness or frontal sinus tenderness.     Mouth/Throat:     Lips: Pink.     Mouth: Mucous membranes are moist.     Pharynx: Uvula midline. No oropharyngeal exudate or posterior oropharyngeal erythema.     Tonsils: No tonsillar exudate.  Eyes:     Conjunctiva/sclera: Conjunctivae normal.     Pupils: Pupils are equal, round, and reactive to light.  Cardiovascular:     Rate and Rhythm: Normal rate and regular rhythm.     Heart sounds: S1 normal and S2 normal. No murmur heard. Pulmonary:     Effort: Pulmonary effort is normal. No respiratory distress.     Breath sounds: Normal breath  sounds. No decreased breath sounds, wheezing, rhonchi or rales.  Abdominal:     General: Bowel sounds are normal.     Palpations: Abdomen is soft.     Tenderness: There is no abdominal tenderness.  Musculoskeletal:        General: No swelling.     Cervical back: Neck supple.  Lymphadenopathy:     Head:     Right side of head: No submental, submandibular, tonsillar, preauricular or posterior auricular adenopathy.     Left side of head: No submental, submandibular, tonsillar, preauricular or posterior auricular adenopathy.     Cervical: No cervical adenopathy.     Right cervical: No superficial cervical adenopathy.    Left cervical: No superficial cervical adenopathy.  Skin:    General: Skin is warm and dry.     Capillary Refill: Capillary refill takes less than 2 seconds.     Findings: No rash.  Neurological:     Mental Status: She is alert and oriented to person, place, and time.  Psychiatric:        Mood and Affect: Mood normal.      UC Treatments / Results  Labs (all labs ordered are listed, but only abnormal results are displayed) Labs Reviewed - No data to display  EKG   Radiology No results found.  Procedures Ear Cerumen Removal  Date/Time: 10/05/2023 11:39 AM  Performed by: Prescilla Sours, FNP Authorized by: Prescilla Sours, FNP   Consent:  Consent obtained:  Verbal   Consent given by:  Patient   Risks, benefits, and alternatives were discussed: yes     Risks discussed:  Bleeding, dizziness, incomplete removal, infection, pain and TM perforation   Alternatives discussed:  No treatment Universal protocol:    Procedure explained and questions answered to patient or proxy's satisfaction: yes     Patient identity confirmed:  Verbally with patient Procedure details:    Location:  R ear and L ear   Procedure type: irrigation     Procedure outcomes: cerumen removed   Post-procedure details:    Inspection:  Ear canal clear and some cerumen remaining (Minimal  crusted blood and cerumen at the base of the right ear canal.  This is not any kind of acute irritation but is from previous lavage.  TMs are normal bilaterally.)   Hearing quality:  Improved   Procedure completion:  Tolerated well, no immediate complications  (including critical care time)  Medications Ordered in UC Medications - No data to display  Initial Impression / Assessment and Plan / UC Course  I have reviewed the triage vital signs and the nursing notes.  Pertinent labs & imaging results that were available during my care of the patient were reviewed by me and considered in my medical decision making (see chart for details).     Ears have cleared with ear lavage.  No sign of infection or redness.  Minimal crusted blood and a little bit of wax in the lower canal on the right.  This is old and from previous ear clearing or lavage.  Return to audiologist for testing for hearing.  Follow-up here if any pain or discharge from ears. Final Clinical Impressions(s) / UC Diagnoses   Final diagnoses:  Hearing loss of both ears due to cerumen impaction  Right ear pain     Discharge Instructions      Ears are clear after ear lavage.  Patient reports some improvement in hearing on the right.  Encouraged to go back to get the hearing assessment and and get the hearing testing for possible hearing aids as planned.  Follow-up here if any pain or discharge from her ears.     ED Prescriptions   None    PDMP not reviewed this encounter.   Prescilla Sours, FNP 10/05/23 1140

## 2023-10-05 NOTE — Discharge Instructions (Addendum)
 Ears are clear after ear lavage.  Patient reports some improvement in hearing on the right.  Encouraged to go back to get the hearing assessment and and get the hearing testing for possible hearing aids as planned.  Follow-up here if any pain or discharge from her ears.

## 2023-10-05 NOTE — ED Triage Notes (Signed)
 Pt reports she has lost her hearing in right ear since last visit on 2/13, she does reports on Sunday she massaged her ear and did get some hearing back. Pt seen PCP 02/24 and was prescribed 2 drops but reports she feels like she has water in the right ear

## 2023-10-17 DIAGNOSIS — H6121 Impacted cerumen, right ear: Secondary | ICD-10-CM | POA: Diagnosis not present

## 2023-10-17 DIAGNOSIS — H903 Sensorineural hearing loss, bilateral: Secondary | ICD-10-CM | POA: Diagnosis not present

## 2023-10-25 DIAGNOSIS — H903 Sensorineural hearing loss, bilateral: Secondary | ICD-10-CM | POA: Diagnosis not present

## 2023-11-21 DIAGNOSIS — H903 Sensorineural hearing loss, bilateral: Secondary | ICD-10-CM | POA: Diagnosis not present

## 2023-11-23 DIAGNOSIS — Z6828 Body mass index (BMI) 28.0-28.9, adult: Secondary | ICD-10-CM | POA: Diagnosis not present

## 2023-11-23 DIAGNOSIS — I10 Essential (primary) hypertension: Secondary | ICD-10-CM | POA: Diagnosis not present

## 2023-12-02 NOTE — Progress Notes (Deleted)
   Cardiology Office Note:    Date:  12/02/2023   ID:  Christine Hart, DOB 1946/03/05, MRN [id number]  PCP:  Street, Renford Cartwright, MD  Cardiologist:  None { Click to update primary MD,subspecialty MD or APP then REFRESH:1}    Referring MD: Street, Renford Cartwright, *   Chief Complaint: ***  History of Present Illness:    Christine Hart is a 78 y.o. female with a history of ***  EKGs/Labs/Other Studies Reviewed:    The following studies were reviewed today: ***  EKG:  EKG *** ordered today. EKG personally reviewed and demonstrates ***.  Recent Labs: No results found for requested labs within last 365 days.  Recent Lipid Panel No results found for: "CHOL", "TRIG", "HDL", "CHOLHDL", "VLDL", "LDLCALC", "LDLDIRECT"  Physical Exam:    Vital Signs: There were no vitals taken for this visit.    Wt Readings from Last 3 Encounters:  12/07/22 165 lb (74.8 kg)  12/01/22 166 lb 9.6 oz (75.6 kg)  10/30/21 157 lb 0.5 oz (71.2 kg)     General: 78 y.o. female in no acute distress. HEENT: Normocephalic and atraumatic. Sclera clear.  Neck: Supple. No carotid bruits. No JVD. Heart: *** RRR. Distinct S1 and S2. No murmurs, gallops, or rubs.  Lungs: No increased work of breathing. Clear to ausculation bilaterally. No wheezes, rhonchi, or rales.  Abdomen: Soft, non-distended, and non-tender to palpation.  Extremities: No lower extremity edema.  Radial and distal pedal pulses 2+ and equal bilaterally. Skin: Warm and dry. Neuro: No focal deficits. Psych: Normal affect. Responds appropriately.   Assessment:    No diagnosis found.  Plan:     Disposition: Follow up in ***   Signed, Casimer Clear, PA-C  12/02/2023 11:21 AM     [facility]

## 2023-12-05 DIAGNOSIS — M5416 Radiculopathy, lumbar region: Secondary | ICD-10-CM | POA: Diagnosis not present

## 2023-12-05 NOTE — Progress Notes (Signed)
 Cardiology Office Note:    Date:  12/12/2023   ID:  Christine Hart, DOB 11-02-1945, MRN [id number]  PCP:  Street, Renford Cartwright, MD  Cardiologist:  None     Referring MD: Christine Hart, *   Chief Complaint: hypertension   History of Present Illness:    Christine Hart is a 78 y.o. female with a history of mitral valve prolapse, SVT, hypertension, hyperlipidemia, arthritis, chronic back pain, and anxiety who presents today as a new patient for further evaluation of hypertension.  Patient states she was first diagnosed with hypertension about 35 years ago.  Her BP has reportedly been as high as 200-220/110 in the past but is doing well on current medications.  She is currently on Toprol  XL 50 mg daily, Telmisartan 40 mg daily, and HCTZ 25 mg daily.  She has been on Toprol -XL and Telmisartan for years but HCTZ was recently added about 1 month ago.  She was previously on Amlodipine  but this was stopped due to her lower extremity swelling.  She does describe some occasional chest pain.  She describes this as a dull/pressure sensation that occurs at rest and last for about 30 to 45 seconds.  It sometimes starts in her back and radiates forward.  She states this has been going on for a couple of years.  She denies any real exertional chest pain.  However, she also reports some dull chest pressure that has woken her up from sleep a couple times in the middle the night.  Pain is not associated with meals.  She states she has trouble getting her lungs full and states her lungs "do not feel satisfied" but she denies any real shortnes of breath.  No orthopnea or PND.  She has occasional palpitations that she describes as heart racing/fluttering.  She states this occurs about once a month and only last for 3 seconds.  She states she was remotely diagnosed with SVT which is why she was initially started on the Toprol  XL.  She no edema since Amlodipine  was stopped.  She does describe some occasional  lightheadedness/dizziness when bending over or going from sitting to standing.  No syncope. She has reports feeling more fatigued over the last several months.  She is bradycardic today with rates of 45 bpm which may be contributing to this.  She states she was diagnosed with mitral valve prolapse in the remote past but no recent Echo.  From a noncardiac standpoint, she has a history of back pain and has undergone surgery on both her cervical and lumbar spine.  She has sciatica of her right leg which does limit her activity some.  She denies a history of remote tobacco use.  She states she smoked briefly in her teens/young 22 but nothing since then.  She does have a family history of heart disease.  Her father had a history of CAD and underwent CABG 3.  Her mother also had a history of MI and CVA.  Both her parents died at age 40.  EKGs/Labs/Other Studies Reviewed:    The following studies were reviewed:  N/A  EKG:  EKG ordered today.   EKG Interpretation Date/Time:  Tuesday Dec 12 2023 08:40:52 EDT Ventricular Rate:  45 PR Interval:  164 QRS Duration:  86 QT Interval:  434 QTC Calculation: 375 R Axis:   29  Text Interpretation: Sinus bradycardia No acute ST/T wave changes No significant changes compared to prior tracings (other than lower heart rate) Confirmed by Hawkin Charo (  [id number]) on 12/12/2023 8:50:48 AM    Recent Labs: No results found for requested labs within last 365 days.  Recent Lipid Panel No results found for: "CHOL", "TRIG", "HDL", "CHOLHDL", "VLDL", "LDLCALC", "LDLDIRECT"  Physical Exam:    Vital Signs: BP 132/78 (BP Location: Left Arm, Patient Position: Sitting, Cuff Size: Normal)   Pulse (!) 45   Ht 5\' 6"  (1.676 m)   Wt 171 lb 6.4 oz (77.7 kg)   SpO2 97%   BMI 27.66 kg/m     Wt Readings from Last 3 Encounters:  12/12/23 171 lb 6.4 oz (77.7 kg)  12/07/22 165 lb (74.8 kg)  12/01/22 166 lb 9.6 oz (75.6 kg)     General: 78 y.o. Caucasian female in no  acute distress. HEENT: Normocephalic and atraumatic. Sclera clear.  Neck: Supple. No JVD. Heart: RRR. Distinct S1 and S2. No murmurs, gallops, or rubs.  Lungs: No increased work of breathing. Clear to ausculation bilaterally. No wheezes, rhonchi, or rales.  Extremities: No lower extremity edema.  Radial a pulses 2+ and equal bilaterally. Skin: Warm and dry. Neuro: No focal deficits. Psych: Normal affect. Responds appropriately.   Assessment:    1. Precordial pain   2. Hypertension, unspecified type   3. History of SVT   4. Sinus bradycardia   5. Mitral valve prolapse   6. Hyperlipidemia, unspecified hyperlipidemia type     Plan:    Chest Pain Patient describes some occasional chest pressure/ dullness at rest for the last couple of years. She also describes a few episodes of dull chest pressure waking her up from sleep over the last 6 months. Pain not associated with meals. No exertional chest pain. - EKG shows no acute ischemic changes but does shows some Q waves in leads V1-V2. - Symptoms sounds atypical. However, she has a family history of CAD. Will get Echo and coronary CTA for further evaluation.  Hypertension BP borderline elevated at 132/78. - Current medications:  Telmisartan 40mg  daily, Toprol -XL 50mg  daily, and HCTZ 25mg  daily. Previously on Amlodipine  but this was stopped due to lower extremity edema. - Will decrease Toprol -XL to 25mg  daily given bradycardia and increase Telmisartan to 80mg  daily. - Will check BMET in 1-2 weeks. Asked patient to drop off BP/HR log when she comes by for these repeat labs.  History of SVT Sinus Bradycardia Patient has a self reported history of SVT that was diagnosed several years ago and she was started on Toprol -XL. However, she is bradycardic today.  - EKG today shows sinus bradycardia with rate of 45 bpm. She reports some increased fatigue with this but is otherwise asymptomatic. - Will decrease Toprol -XL to 25mg  daily.  Mitral  Valve Prolapse Patient has a self reported history of mitral valve prolapse. No Echo in [facility] or in Care Everywhere. - Will order Echo.   Hyperlipidemia Per patient report, LDL was around 120 when PCP last checked labs in 08/2023. Unable to see these labs. - She is not currently on any statin. - Will asked for most recent labs to be faxed to us  and will wait for coronary CTA results to help guide management.   Disposition: Follow up in 3 months after the above studies.   Signed, Casimer Clear, PA-C  12/12/2023 9:50 AM    [facility]

## 2023-12-12 ENCOUNTER — Ambulatory Visit: Attending: Student | Admitting: Student

## 2023-12-12 ENCOUNTER — Institutional Professional Consult (permissible substitution) (INDEPENDENT_AMBULATORY_CARE_PROVIDER_SITE_OTHER)

## 2023-12-12 ENCOUNTER — Ambulatory Visit (INDEPENDENT_AMBULATORY_CARE_PROVIDER_SITE_OTHER): Admitting: Audiology

## 2023-12-12 ENCOUNTER — Encounter: Payer: Self-pay | Admitting: Student

## 2023-12-12 VITALS — BP 132/78 | HR 45 | Ht 66.0 in | Wt 171.4 lb

## 2023-12-12 DIAGNOSIS — R001 Bradycardia, unspecified: Secondary | ICD-10-CM | POA: Diagnosis not present

## 2023-12-12 DIAGNOSIS — E785 Hyperlipidemia, unspecified: Secondary | ICD-10-CM | POA: Diagnosis not present

## 2023-12-12 DIAGNOSIS — I10 Essential (primary) hypertension: Secondary | ICD-10-CM | POA: Diagnosis not present

## 2023-12-12 DIAGNOSIS — I471 Supraventricular tachycardia, unspecified: Secondary | ICD-10-CM | POA: Diagnosis not present

## 2023-12-12 DIAGNOSIS — R072 Precordial pain: Secondary | ICD-10-CM | POA: Diagnosis not present

## 2023-12-12 DIAGNOSIS — I341 Nonrheumatic mitral (valve) prolapse: Secondary | ICD-10-CM | POA: Diagnosis not present

## 2023-12-12 MED ORDER — TELMISARTAN 80 MG PO TABS: 80.0000 mg | ORAL_TABLET | Freq: Every day | ORAL | 2 refills | Status: DC

## 2023-12-12 MED ORDER — METOPROLOL SUCCINATE ER 25 MG PO TB24: 25.0000 mg | ORAL_TABLET | Freq: Every day | ORAL | 2 refills | Status: DC

## 2023-12-12 NOTE — Patient Instructions (Addendum)
 Medication Instructions:  DECREASE METOPROLOL  SUCCINATE 25MG  DAILY INCREASE TELIMESARTAN 80MG  DAILY  *If you need a refill on your cardiac medications before your next appointment, please call your pharmacy*  Lab Work:  BMET IN 1-2 WEEKS If you have labs (blood work) drawn today and your tests are completely normal, you will receive your results only by:  MyChart Message (if you have MyChart) OR A paper copy in the mail If you have any lab test that is abnormal or we need to change your treatment, we will call you to review the results.  Testing/Procedures: Your physician has requested that you have an echocardiogram. Echocardiography is a painless test that uses sound waves to create images of your heart. It provides your doctor with information about the size and shape of your heart and how well your heart's chambers and valves are working. This procedure takes approximately one hour. There are no restrictions for this procedure. Please do NOT wear cologne, perfume, aftershave, or lotions (deodorant is allowed). Please arrive 15 minutes prior to your appointment time.  Please note: We ask at that you not bring children with you during ultrasound (echo/ vascular) testing. Due to room size and safety concerns, children are not allowed in the ultrasound rooms during exams. Our front office staff cannot provide observation of children in our lobby area while testing is being conducted. An adult accompanying a patient to their appointment will only be allowed in the ultrasound room at the discretion of the ultrasound technician under special circumstances. We apologize for any inconvenience.  Your physician has requested that you have cardiac CT. Cardiac computed tomography (CT) is a painless test that uses an x-ray machine to take clear, detailed pictures of your heart. For further information please visit [email or web address] Please follow instruction sheet as given.    Follow-Up: At [facility], you and your health needs are our priority.  As part of our continuing mission to provide you with exceptional heart care, our providers are all part of one team.  This team includes your primary Cardiologist (physician) and Advanced Practice Providers or APPs (Physician Assistants and Nurse Practitioners) who all work together to provide you with the care you need, when you need it.  Your next appointment:   AFTER TESTING   Provider:   None or Sharren Decree, PA-C          We recommend signing up for the patient portal called "MyChart".  Sign up information is provided on this After Visit Summary.  MyChart is used to connect with patients for Virtual Visits (Telemedicine).  Patients are able to view lab/test results, encounter notes, upcoming appointments, etc.  Non-urgent messages can be sent to your provider as well.   To learn more about what you can do with MyChart, go to [email or web address].   Other Instructions   Your cardiac CT will be scheduled at one of the below locations:   [facility] [address] [place], [place] [address] [phone]    Jeralene Mom. [facility] and Vascular Tower [address]  [place], [place] [address] Opening November 20, 2023  If scheduled at [facility], please arrive at the [facility] and Children's Entrance (Entrance C2) of [facility] 30 minutes prior to test start time. You can use the FREE valet parking offered at entrance C (encouraged to control the heart rate for the test)  Proceed to the [facility] Radiology Department (first floor) to check-in and test prep.   All  radiology patients and guests should use entrance C2 at [facility], accessed from [facility], even though the hospital's physical address listed is [address]    If scheduled at the Heart and Vascular Tower at [address], please enter the parking lot using the [address] entrance and  use the FREE valet service at the patient drop-off area. Enter the buidling and check-in with registration on the main floor.    Please follow these instructions carefully (unless otherwise directed):  An IV will be required for this test and Nitroglycerin will be given.  Hold all erectile dysfunction medications at least 3 days (72 hrs) prior to test. (Ie viagra, cialis, sildenafil, tadalafil, etc)   On the Night Before the Test: Be sure to Drink plenty of water. Do not consume any caffeinated/decaffeinated beverages or chocolate 12 hours prior to your test. Do not take any antihistamines 12 hours prior to your test.  On the Day of the Test: Drink plenty of water until 1 hour prior to the test. Do not eat any food 1 hour prior to test. You may take your regular medications prior to the test.  Take metoprolol  succinate (Toprol  XL) 25 mg by mouth two hours prior to test. If you take Hydrochlorothiazide please HOLD on the morning of the test. Patients who wear a continuous glucose monitor MUST remove the device prior to scanning. FEMALES- please wear underwire-free bra if available, avoid dresses & tight clothing  After the Test: Drink plenty of water. After receiving IV contrast, you may experience a mild flushed feeling. This is normal. On occasion, you may experience a mild rash up to 24 hours after the test. This is not dangerous. If this occurs, you can take Benadryl 25 mg, Zyrtec, Claritin, or Allegra and increase your fluid intake. (Patients taking Tikosyn should avoid Benadryl, and may take Zyrtec, Claritin, or Allegra) If you experience trouble breathing, this can be serious. If it is severe call 911 IMMEDIATELY. If it is mild, please call our office.  We will call to schedule your test 2-4 weeks out understanding that some insurance companies will need an authorization prior to the service being performed.   For more information and frequently asked questions, please visit our  website : [email or web address]  For non-scheduling related questions, please contact the cardiac imaging nurse navigator should you have any questions/concerns: Cardiac Imaging Nurse Navigators Direct Office Dial: [phone]   For scheduling needs, including cancellations and rescheduling, please call [place], [phone].

## 2023-12-12 NOTE — Addendum Note (Signed)
 Addended by: Dorthey Gave on: 12/12/2023 11:00 AM   Modules accepted: Orders

## 2023-12-14 ENCOUNTER — Telehealth: Payer: Self-pay

## 2023-12-14 NOTE — Telephone Encounter (Signed)
 Delaine Favorite, LPN CT Scheduled 12/25/23 ECHO Scheduled 01-18-24

## 2023-12-14 NOTE — Telephone Encounter (Signed)
-----   Message from Nurse Aneta Bar sent at 12/12/2023  9:57 AM EDT ----- Regarding: CARDIAC CT PER CALLIE GOODRICH PA-C Callie Goodrich PA-C ordered for this pt to get a Cardiac CT for precordial chest pain  We will have her come back in 1 week for a bmet  Order is there  Please pre-cert  Please call and arrange and shoot Lavonda Pour the date for her follow-up   Thanks all, James Mcardle

## 2023-12-15 ENCOUNTER — Telehealth (HOSPITAL_BASED_OUTPATIENT_CLINIC_OR_DEPARTMENT_OTHER): Payer: Self-pay | Admitting: Family

## 2023-12-15 MED ORDER — METOPROLOL SUCCINATE ER 25 MG PO TB24: 25.0000 mg | ORAL_TABLET | Freq: Every day | ORAL | 3 refills | Status: DC

## 2023-12-15 NOTE — Telephone Encounter (Signed)
 Pt's medication was sent to pt's pharmacy as requested. Confirmation received.

## 2023-12-15 NOTE — Telephone Encounter (Signed)
 Pt c/o medication issue:  1. Name of Medication: Metoprolol   2. How are you currently taking this medication (dosage and times per day)?   3. Are you having a reaction (difficulty breathing--STAT)?   4. What is your medication issue? The pharmacist would not refill this medicine, because there were no directions      *STAT* If patient is at the pharmacy, call can be transferred to refill team.   1. Which medications need to be refilled? (please list name of each medication and dose if known) Metoprolol    2. Would you like to learn more about the convenience, safety, & potential cost savings by using the [facility]?     3. Are you open to using the [facility] (Type [facility]  4. Which pharmacy/location (including street and city if local pharmacy) is medication to be sent to? *[facility] [place]    5. Do they need a 30 day or 90 day supply? 30 days

## 2023-12-22 ENCOUNTER — Telehealth (HOSPITAL_COMMUNITY): Payer: Self-pay | Admitting: Emergency Medicine

## 2023-12-22 ENCOUNTER — Encounter (HOSPITAL_COMMUNITY): Payer: Self-pay

## 2023-12-22 NOTE — Telephone Encounter (Signed)
 Reaching out to patient to offer assistance regarding upcoming cardiac imaging study; pt verbalizes understanding of appt date/time, parking situation and where to check in, pre-test NPO status and medications ordered, and verified current allergies; name and call back number provided for further questions should they arise Rockwell Alexandria RN Navigator Cardiac Imaging [facility] [phone] office [phone] cell

## 2023-12-25 ENCOUNTER — Ambulatory Visit (HOSPITAL_COMMUNITY)
Admission: RE | Admit: 2023-12-25 | Discharge: 2023-12-25 | Disposition: A | Discharge status: Home / Self Care | Source: Ambulatory Visit | Attending: Cardiovascular Disease | Admitting: Cardiovascular Disease

## 2023-12-25 DIAGNOSIS — R072 Precordial pain: Secondary | ICD-10-CM | POA: Insufficient documentation

## 2023-12-25 DIAGNOSIS — K582 Mixed irritable bowel syndrome: Secondary | ICD-10-CM | POA: Diagnosis not present

## 2023-12-25 DIAGNOSIS — I2510 Atherosclerotic heart disease of native coronary artery without angina pectoris: Secondary | ICD-10-CM | POA: Insufficient documentation

## 2023-12-25 DIAGNOSIS — R931 Abnormal findings on diagnostic imaging of heart and coronary circulation: Secondary | ICD-10-CM | POA: Insufficient documentation

## 2023-12-25 DIAGNOSIS — Q2112 Patent foramen ovale: Secondary | ICD-10-CM | POA: Insufficient documentation

## 2023-12-25 DIAGNOSIS — Z6828 Body mass index (BMI) 28.0-28.9, adult: Secondary | ICD-10-CM | POA: Diagnosis not present

## 2023-12-25 DIAGNOSIS — M1991 Primary osteoarthritis, unspecified site: Secondary | ICD-10-CM | POA: Diagnosis not present

## 2023-12-25 DIAGNOSIS — I10 Essential (primary) hypertension: Secondary | ICD-10-CM | POA: Diagnosis not present

## 2023-12-25 DIAGNOSIS — M4727 Other spondylosis with radiculopathy, lumbosacral region: Secondary | ICD-10-CM | POA: Diagnosis not present

## 2023-12-25 DIAGNOSIS — E663 Overweight: Secondary | ICD-10-CM | POA: Diagnosis not present

## 2023-12-25 MED ORDER — NITROGLYCERIN 0.4 MG SL SUBL
SUBLINGUAL_TABLET | SUBLINGUAL | Status: AC
  Filled 2023-12-25: qty 2

## 2023-12-25 MED ORDER — IOHEXOL 350 MG/ML SOLN
100.0000 mL | Freq: Once | INTRAVENOUS | Status: AC | PRN
  Administered 2023-12-25: 100 mL via INTRAVENOUS

## 2023-12-25 MED ORDER — NITROGLYCERIN 0.4 MG SL SUBL
0.8000 mg | SUBLINGUAL_TABLET | Freq: Once | SUBLINGUAL | Status: AC
  Administered 2023-12-25: 0.8 mg via SUBLINGUAL

## 2023-12-26 ENCOUNTER — Ambulatory Visit: Payer: Self-pay | Admitting: Student

## 2023-12-26 ENCOUNTER — Other Ambulatory Visit: Payer: Self-pay | Admitting: Cardiology

## 2023-12-26 ENCOUNTER — Ambulatory Visit (HOSPITAL_COMMUNITY)
Admission: RE | Admit: 2023-12-26 | Discharge: 2023-12-26 | Disposition: A | Discharge status: Home / Self Care | Source: Ambulatory Visit | Attending: Cardiology

## 2023-12-26 DIAGNOSIS — R931 Abnormal findings on diagnostic imaging of heart and coronary circulation: Secondary | ICD-10-CM

## 2023-12-26 DIAGNOSIS — D485 Neoplasm of uncertain behavior of skin: Secondary | ICD-10-CM | POA: Diagnosis not present

## 2023-12-26 DIAGNOSIS — I2510 Atherosclerotic heart disease of native coronary artery without angina pectoris: Secondary | ICD-10-CM | POA: Diagnosis not present

## 2023-12-26 DIAGNOSIS — R072 Precordial pain: Secondary | ICD-10-CM | POA: Diagnosis not present

## 2023-12-26 DIAGNOSIS — Z01812 Encounter for preprocedural laboratory examination: Secondary | ICD-10-CM

## 2023-12-26 DIAGNOSIS — L821 Other seborrheic keratosis: Secondary | ICD-10-CM | POA: Diagnosis not present

## 2023-12-26 MED ORDER — ASPIRIN 81 MG PO TBEC: 81.0000 mg | DELAYED_RELEASE_TABLET | Freq: Every day | ORAL | Status: AC

## 2023-12-26 MED ORDER — ROSUVASTATIN CALCIUM 40 MG PO TABS
40.0000 mg | ORAL_TABLET | Freq: Every day | ORAL | 3 refills | Status: AC
  Filled 2024-03-26: qty 90, 90d supply, fill #0
  Filled 2024-06-16: qty 90, 90d supply, fill #1

## 2023-12-27 DIAGNOSIS — I10 Essential (primary) hypertension: Secondary | ICD-10-CM | POA: Diagnosis not present

## 2023-12-27 DIAGNOSIS — Z01812 Encounter for preprocedural laboratory examination: Secondary | ICD-10-CM | POA: Diagnosis not present

## 2023-12-27 DIAGNOSIS — M545 Low back pain, unspecified: Secondary | ICD-10-CM | POA: Diagnosis not present

## 2023-12-27 LAB — CBC
Hematocrit: 41.3 % (ref 34.0–46.6)
Hemoglobin: 13.2 g/dL (ref 11.1–15.9)
MCH: 30.3 pg (ref 26.6–33.0)
MCHC: 32 g/dL (ref 31.5–35.7)
MCV: 95 fL (ref 79–97)
Platelets: 228 10*3/uL (ref 150–450)
RBC: 4.35 x10E6/uL (ref 3.77–5.28)
RDW: 12.1 % (ref 11.7–15.4)
WBC: 7.3 10*3/uL (ref 3.4–10.8)

## 2023-12-28 LAB — BASIC METABOLIC PANEL WITH GFR
BUN/Creatinine Ratio: 17 (ref 12–28)
BUN: 16 mg/dL (ref 8–27)
CO2: 23 mmol/L (ref 20–29)
Calcium: 9.6 mg/dL (ref 8.7–10.3)
Chloride: 97 mmol/L (ref 96–106)
Creatinine, Ser: 0.96 mg/dL (ref 0.57–1.00)
Glucose: 92 mg/dL (ref 70–99)
Potassium: 4.3 mmol/L (ref 3.5–5.2)
Sodium: 136 mmol/L (ref 134–144)
eGFR: 62 mL/min/{1.73_m2} (ref >59)

## 2023-12-31 IMAGING — DX DG CHEST 2V
2 series · 2 of 2 positions shown · non-contrast
Comparison: Prior chest radiographs 10/24/2021 and earlier.

CLINICAL DATA: Cough, congestion. Additional history provided:
Cough, congestion, sore throat, wheezing. Flu positive on [REDACTED].

EXAM:
CHEST - 2 VIEW

[chest pa]
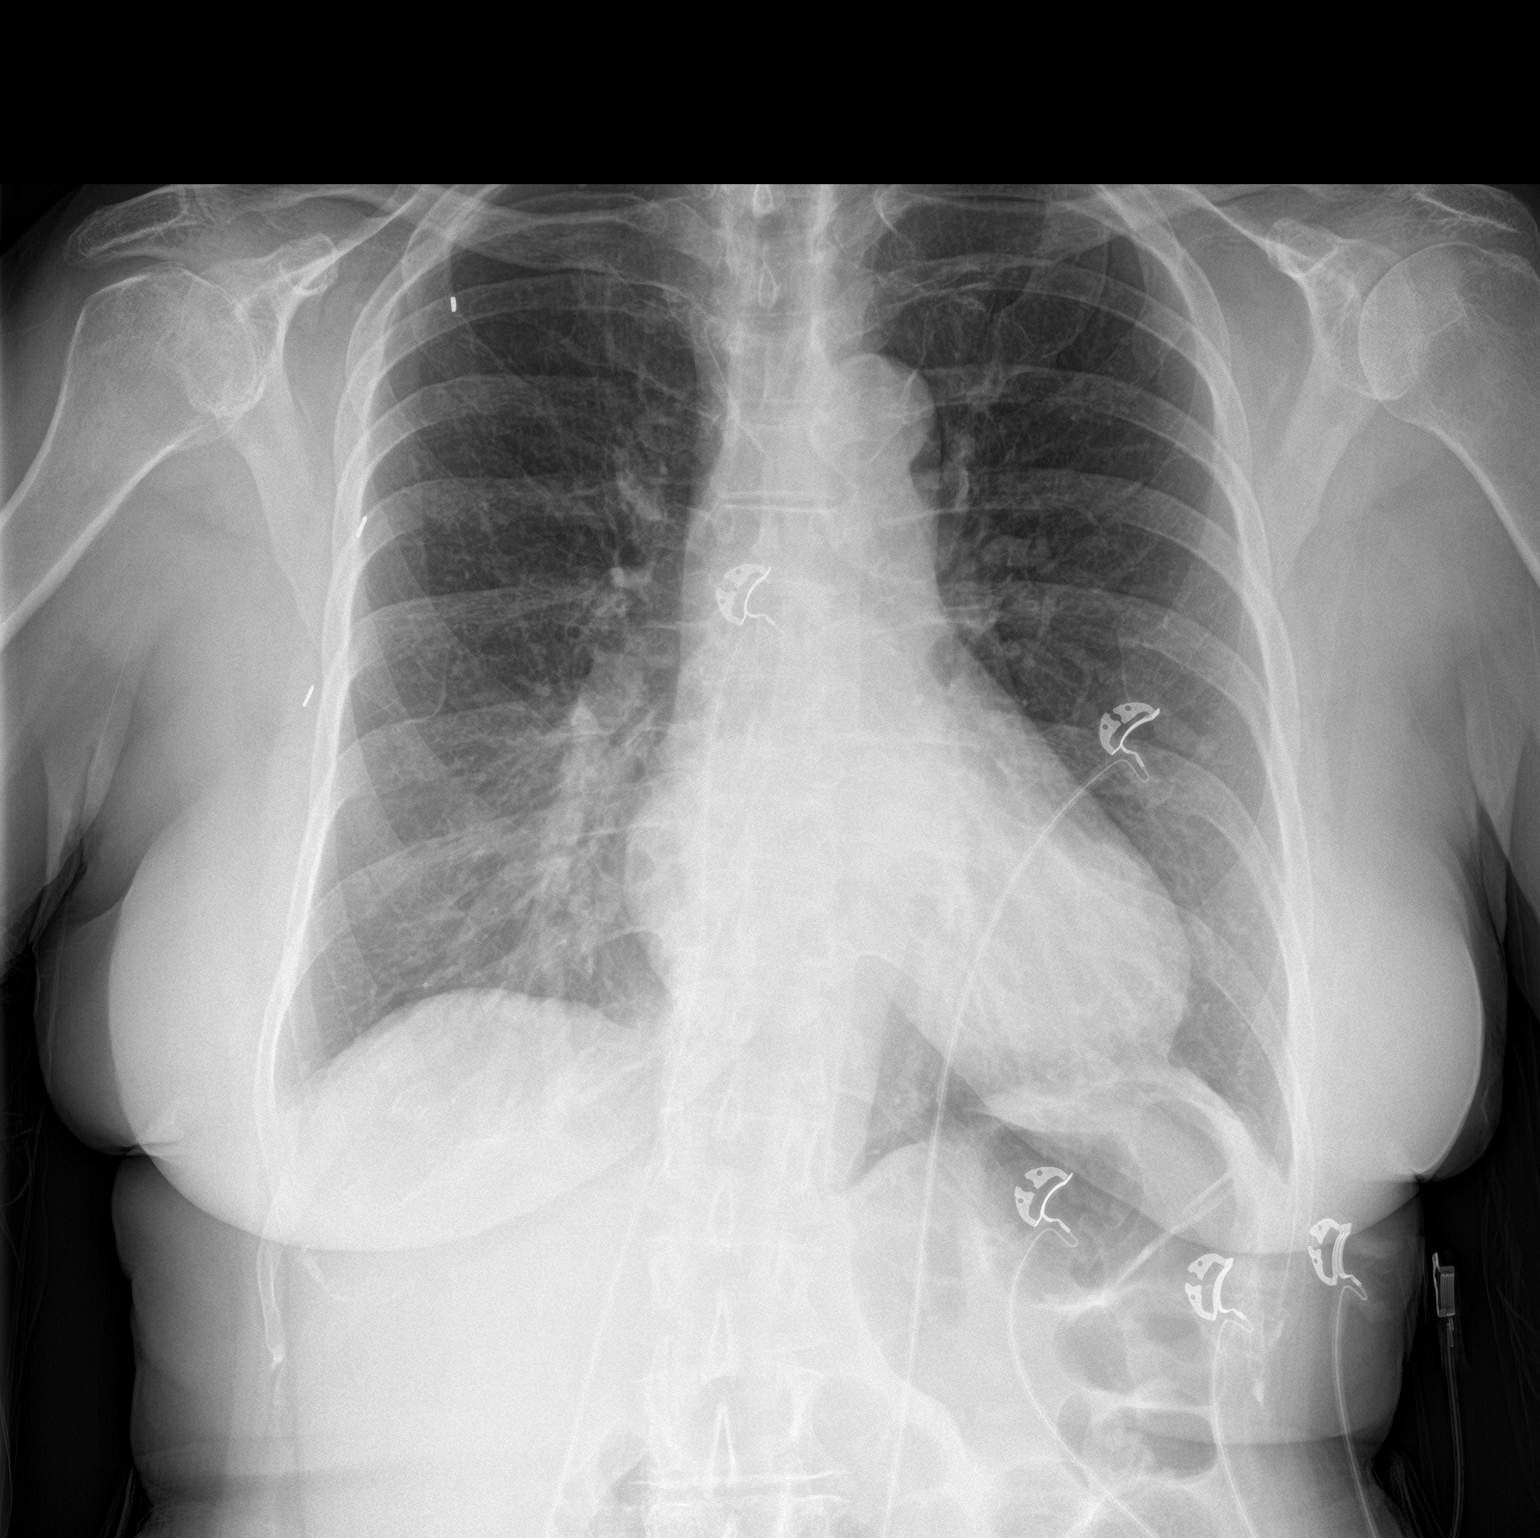

[chest lat]
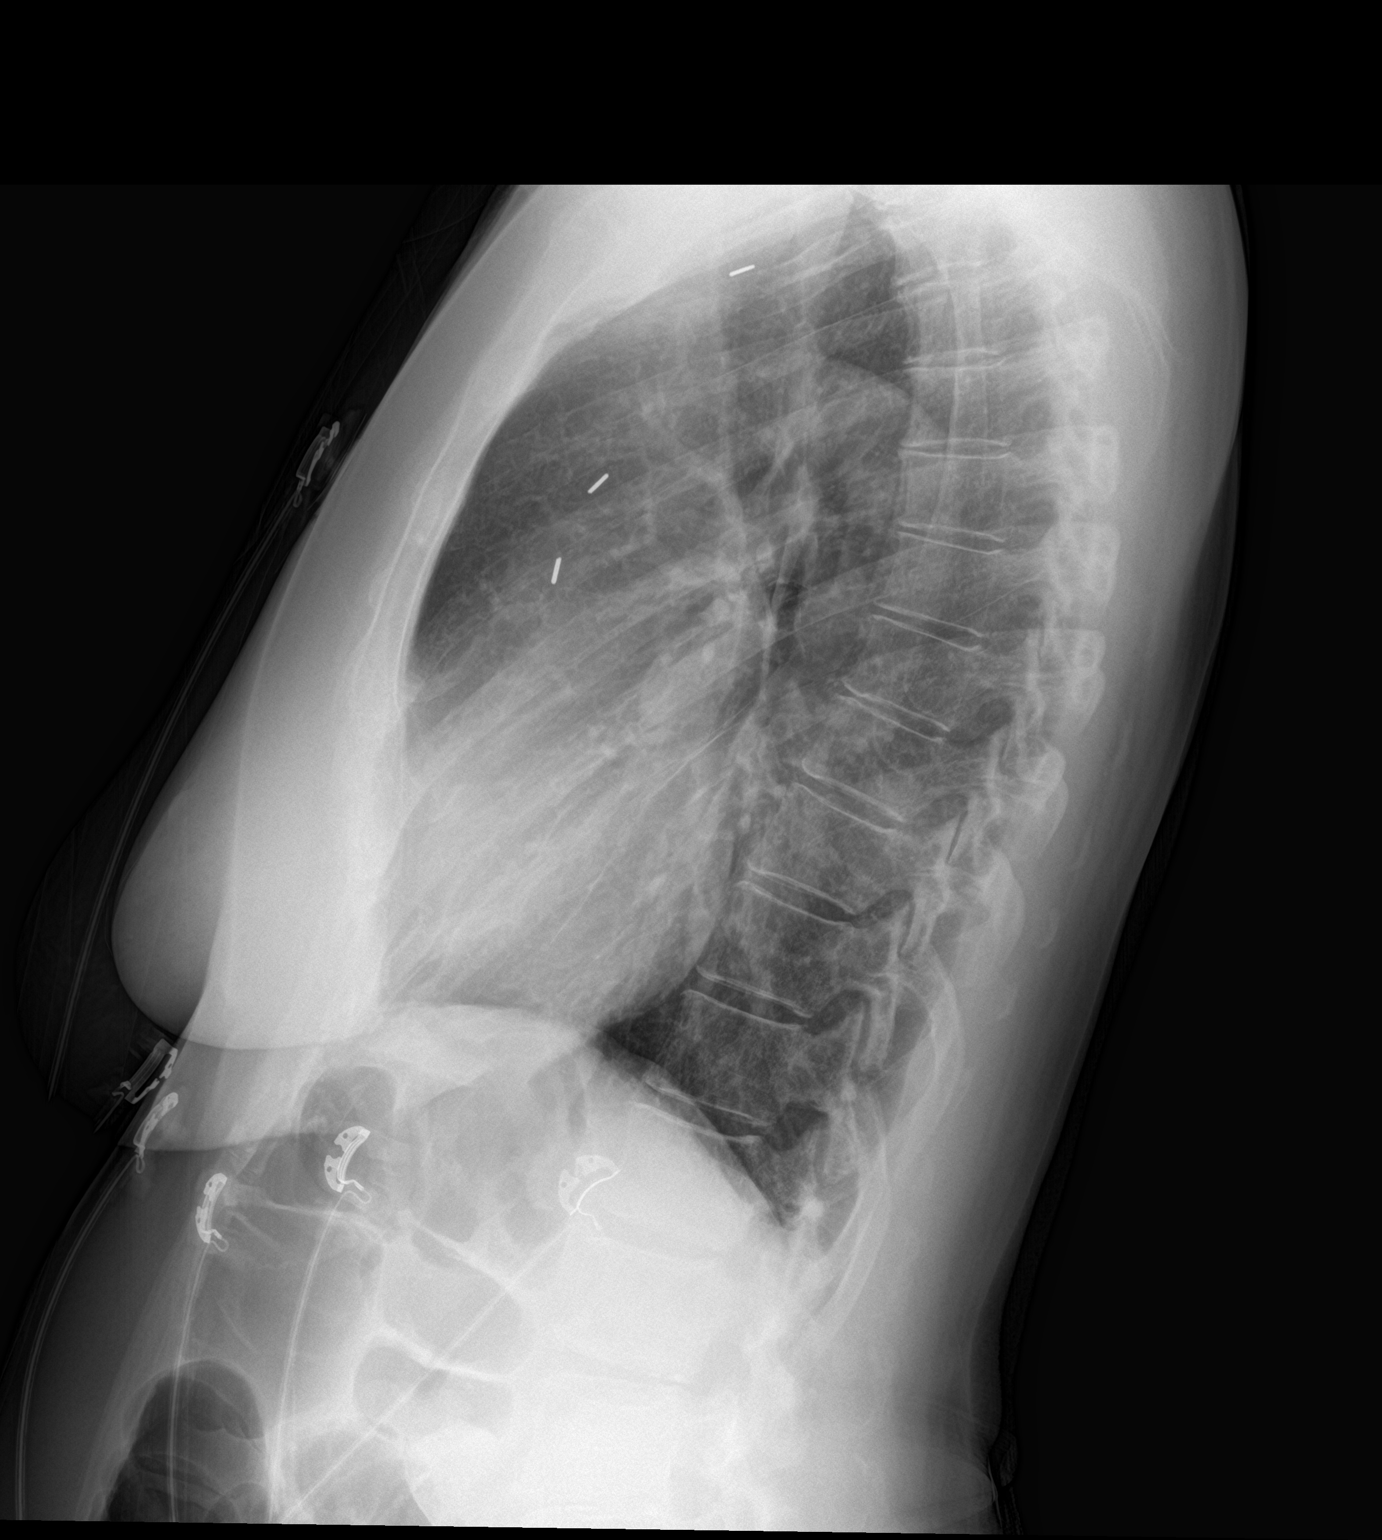

[2 of 2 positions shown; findings below may reference images not displayed]

FINDINGS: Heart size within normal limits. No appreciable airspace
consolidation. No evidence of pleural effusion or pneumothorax. No
acute bony abnormality identified. ACDF hardware.
IMPRESSION: No evidence of acute cardiopulmonary abnormality.

## 2024-01-01 NOTE — Progress Notes (Unsigned)
 Cardiology Office Note:    Date:  01/01/2024   ID:  Christine Hart, DOB Aug 18, 1945, MRN [id number]  PCP:  Christine Hart, Christine Cartwright, MD  Cardiologist:  None { Click to update primary MD,subspecialty MD or APP then REFRESH:1}    Referring MD: Christine Hart, Christine Hart, *   Chief Complaint: follow-up of abnormal coronary CTA  History of Present Illness:    Christine Hart is a 78 y.o. female with a history of severe CAD noted on recent coronary CTA on 12/25/2023, mitral valve prolapse, SVT, hypertension, hyperlipidemia, arthritis, chronic back pain, and anxiety who presents today for follow-up of abnormal coronary CTA.  Patient was first seen by me as a new patient on 12/12/2023 for further evaluation of hypertension. At that time, she reported she was first diagnosed with hypertension about 35 years ago. Her BP had reportedly been as high as 200-220/110 in the past but her BP was doing well on her current medications. However, at that visit, she reported occasional dull chest pain/ pressure at rest that last for 30-45 seconds a time. She also reported a couple of episodes that woke her up from sleep a couple times in the middle of the night. She denied any exertional chest pain. She also reported history a remote history of mitral valve prolapse and SVT but denied any significant palpitations. Coronary CTA and Echo were ordered for further evaluation. Coronary CTA showed a coronary calcium  score of 1,082 and total plaque volume of 761 (85th percentile for age and sex) as well as severe stenosis of proximal/ mid LAD, severe stenosis of ostial D2, moderate stenosis of LCX, and moderate stenosis of OM2.  FFR showed lesions in the LAD, D2, and LCX were hemodynamically significant. Echo is scheduled for later this month.  She presents today for follow-up. We reviewed the coronary CTA results. ***  Chest Pain CAD Patient reported occasional chest pressure/ dullness at last visit in 11/2023. Coronary CTA was  ordered and showed  a coronary calcium  score of 1,082 and total plaque volume of 761 (85th percentile for age and sex) as well as severe stenosis of proximal/ mid LAD, severe stenosis of ostial D2, moderate stenosis of LCX, and moderate stenosis of OM2. FFR showed lesions in the LAD, D2, and LCX were hemodynamically significant.  - *** - Continue Aspirin  81mg  daily and Crestor  40mg  daily. - Will arrange left cardiac catheterization. Recent CBC and BMET last week were normal. ***  Mitral Valve Prolapse Patient has a self reported history of mitral valve prolapse. No Echo in [facility] or in Care Everywhere.  - Echo scheduled for 01/18/2024.  History of SVT History of Sinus Bradycardia *** Patient has a self reported history of SVT that was diagnosed several years ago and she was started on Toprol -XL.  However, this was decreased at last visit due to bradycardia. - *** - Continue Toprol -XL 25mg  daily.   Hypertension BP *** - Continue current medications: Telmisartan  80mg  daily, Toprol -XL 25mg  daily, and HCTZ 25mg  daily.  - Of note, previously on Amlodipine  but this was stopped due to lower extremity edema.  Hyperlipidemia Per patient report, LDL was around 120 when PCP last checked labs in 08/2023. Unable to see these labs.  - Patient was started on Crestor  40mg  daily after recent coronary CTA. Continue. - Will repeat lipid panel and LFTs in 2-3 months. ***  EKGs/Labs/Other Studies Reviewed:    The following studies were reviewed:  Coronary CTA 12/25/2023: Impression: 1. Coronary calcium  score is 1082. Unclear  race precludes accurate percentile analysis. 2. Total plaque volume (TPV) is 761 mm3, categorized as extensive, which is 85th percentile for age- and sex-matched controls. 3. Normal coronary origins with right dominance. 4. CAD-RADS 4 Severe coronary artery disease. 5. Severe stenosis (70-99%) at the proximal/mid LAD due to mixed plaque. 6. Moderate stenosis (50-69%) due to  soft plaque at mid LCX. 7. Moderate stenosis (50-69%) at the ostium of OM2. 8. Severe stenosis (70-99%) due to mixed plaque at the ostial D2 segment. 9. Patent foramen ovale. 10. CT FFR will be performed and reported separately.  FFR Summary: 1. CT FFR analysis showed significant stenosis in proximal / mid LAD and mid/distal LAD (this may be due to both disease burden and vessel tapering), clinical correlation required. 2. CT FFR analysis showed significant stenosis in second diagonal branch. 3.  CT FFR analysis showed significant stenosis in mid LCX.   Recommendations: Recommend invasive angiography if no contraindications, clinical correlation required.  EKG:  EKG not ordered today.   Recent Labs: 12/27/2023: BUN 16; Creatinine, Ser 0.96; Hemoglobin 13.2; Platelets 228; Potassium 4.3; Sodium 136  Recent Lipid Panel No results found for: "CHOL", "TRIG", "HDL", "CHOLHDL", "VLDL", "LDLCALC", "LDLDIRECT"  Physical Exam:    Vital Signs: There were no vitals taken for this visit.    Wt Readings from Last 3 Encounters:  12/12/23 171 lb 6.4 oz (77.7 kg)  12/07/22 165 lb (74.8 kg)  12/01/22 166 lb 9.6 oz (75.6 kg)     General: 78 y.o. female in no acute distress. HEENT: Normocephalic and atraumatic. Sclera clear.  Neck: Supple. No carotid bruits. No JVD. Heart: *** RRR. Distinct S1 and S2. No murmurs, gallops, or rubs.  Lungs: No increased work of breathing. Clear to ausculation bilaterally. No wheezes, rhonchi, or rales.  Abdomen: Soft, non-distended, and non-tender to palpation.  Extremities: No lower extremity edema.  Radial and distal pedal pulses 2+ and equal bilaterally. Skin: Warm and dry. Neuro: No focal deficits. Psych: Normal affect. Responds appropriately.   Assessment:    No diagnosis found.  Plan:     Disposition: Follow up in ***   Signed, Casimer Clear, PA-C  01/01/2024 12:04 AM     [facility]

## 2024-01-01 NOTE — H&P (View-Only) (Signed)
 Cardiology Office Note:    Date:  01/04/2024   ID:  Christine Hart, DOB 04-07-1946, MRN [id number]  PCP:  Street, Lonni HERO, MD  Cardiologist:  Ozell Fell, MD Cardiology APP:  Ponce Skillman E, PA-C     Referring MD: Christine Hart, *   Chief Complaint: follow-up of abnormal coronary CTA  History of Present Illness:    Christine Hart is a 78 y.o. female with a history of severe CAD noted on recent coronary CTA on 12/25/2023, mitral valve prolapse, SVT, hypertension, hyperlipidemia, arthritis, chronic back pain, and anxiety who presents today for follow-up of abnormal coronary CTA.  Patient was first seen by me as a new patient on 12/12/2023 for further evaluation of hypertension. At that time, she reported she was first diagnosed with hypertension about 35 years ago. Her BP had reportedly been as high as 200-220/110 in the past but her BP was doing well on her current medications. However, at that visit, she reported occasional dull chest pain/ pressure at rest that last for 30-45 seconds a time. She also reported a couple of episodes that woke her up from sleep a couple times in the middle of the night. She denied any exertional chest pain. She also reported history a remote history of mitral valve prolapse and SVT but denied any significant palpitations. Coronary CTA and Echo were ordered for further evaluation. Coronary CTA showed a coronary calcium  score of 1,082 and total plaque volume of 761 (85th percentile for age and sex) as well as severe stenosis of proximal/ mid LAD, severe stenosis of ostial D2, moderate stenosis of LCX, and moderate stenosis of OM2.  FFR showed lesions in the LAD, D2, and LCX were hemodynamically significant. Echo is scheduled for later this month.  She presents today for follow-up. Here with her husband. We reviewed the coronary CTA results.  She continues to report some chest tightness about twice a week that can sometimes last all day.  She  describes this as a sensation like she cannot take a full breath.  This sometimes improves when she takes a half dose of Xanax .  However, she denies any recurrent dull chest pain or any recurrent pain waking her up from sleep. No other shortness of breath.  No orthopnea, PND, or edema.  She has brief episode of palpitations about once a month but nothing significant.  She occasionally has some mild lightheadedness/dizziness if she stands quickly but no syncope.  She has severe sciatica that limits her activity.  She states she scheduled to have a spinal injection soon for this.  She also is due to have a Mohs procedure for skin cancer on her face next week.  EKGs/Labs/Other Studies Reviewed:    The following studies were reviewed:  Coronary CTA 12/25/2023: Impression: 1. Coronary calcium  score is 1082. Unclear race precludes accurate percentile analysis. 2. Total plaque volume (TPV) is 761 mm3, categorized as extensive, which is 85th percentile for age- and sex-matched controls. 3. Normal coronary origins with right dominance. 4. CAD-RADS 4 Severe coronary artery disease. 5. Severe stenosis (70-99%) at the proximal/mid LAD due to mixed plaque. 6. Moderate stenosis (50-69%) due to soft plaque at mid LCX. 7. Moderate stenosis (50-69%) at the ostium of OM2. 8. Severe stenosis (70-99%) due to mixed plaque at the ostial D2 segment. 9. Patent foramen ovale. 10. CT FFR will be performed and reported separately.  FFR Summary: 1. CT FFR analysis showed significant stenosis in proximal / mid LAD and mid/distal LAD (this  may be due to both disease burden and vessel tapering), clinical correlation required. 2. CT FFR analysis showed significant stenosis in second diagonal branch. 3.  CT FFR analysis showed significant stenosis in mid LCX.   Recommendations: Recommend invasive angiography if no contraindications, clinical correlation required.  EKG:  EKG ordered today.   EKG  Interpretation Date/Time:  Thursday January 04 2024 10:25:49 EDT Ventricular Rate:  53 PR Interval:  152 QRS Duration:  90 QT Interval:  416 QTC Calculation: 390 R Axis:   -28  Text Interpretation: Sinus bradycardia  No acute ischemic changes Confirmed by Hosam Mcfetridge [phone]) on 01/04/2024 1:18:29 PM     Recent Labs: 12/27/2023: BUN 16; Creatinine, Ser 0.96; Hemoglobin 13.2; Platelets 228; Potassium 4.3; Sodium 136  Recent Lipid Panel No results found for: CHOL, TRIG, HDL, CHOLHDL, VLDL, LDLCALC, LDLDIRECT  Physical Exam:    Vital Signs: BP 124/78   Pulse (!) 52   Ht 5' 6 (1.676 m)   Wt 170 lb 6.4 oz (77.3 kg)   SpO2 98%   BMI 27.50 kg/m     Wt Readings from Last 3 Encounters:  01/04/24 170 lb 6.4 oz (77.3 kg)  12/12/23 171 lb 6.4 oz (77.7 kg)  12/07/22 165 lb (74.8 kg)     General: 78 y.o. Caucasian female in no acute distress. HEENT: Normocephalic and atraumatic. Sclera clear.  Neck: Supple.No JVD. Heart: RRR. Distinct S1 and S2. No murmurs, gallops, or rubs.  Lungs: No increased work of breathing. Clear to ausculation bilaterally. No wheezes, rhonchi, or rales.  Extremities: No lower extremity edema.   Skin: Warm and dry. Neuro: No focal deficits. Psych: Normal affect. Responds appropriately.  Assessment:    1. Chest tightness   2. Coronary artery disease involving native coronary artery of native heart with angina pectoris (HCC)   3. Mitral valve prolapse   4. History of SVT   5. Primary hypertension   6. Hyperlipidemia, unspecified hyperlipidemia type     Plan:    Chest Pain CAD Patient reported occasional chest pressure/ dullness at last visit in 11/2023. Coronary CTA was ordered and showed  a coronary calcium  score of 1,082 and total plaque volume of 761 (85th percentile for age and sex) as well as severe stenosis of proximal/ mid LAD, severe stenosis of ostial D2, moderate stenosis of LCX, and moderate stenosis of OM2. FFR showed lesions  in the LAD, D2, and LCX were hemodynamically significant.  - She reports episodes of chest tightness a couple of times a week that often improve when she takes half a dose of Xanax . - EKG today shows no acute ischemic changes. - Will start Imdur  30mg  daily. - Continue Aspirin  81mg  daily and Crestor  40mg  daily.  - Will provide prescription of sublingual Nitroglycerin . - Will arrange left cardiac catheterization. Recent CBC and BMET on 12/27/2023 were normal. Advised patient to hold HCTZ on morning of cath. - Reviewed ED precautions.  Of note, patient has severe sciatica that limits her activity and states she needs to have a spinal injection. She is also due to have a Mohs procedure for treatment of skin cancer on her face on 01/09/2024. She states both of these are to be done under local anesthesia. Discussed with Dr. Wonda who reviewed coronary CTA results - OK to have both of these procedures done next week since they are low risk and will be done under local anesthesia so that these procedures do not have to be delayed for several months if PCI  is performed. Will then set her up for [facility] on 01/15/2024 with Dr. Wonda.  Shared Decision Making/Informed Consent{ The risks [stroke (1 in 1000), death (1 in 1000), kidney failure [usually temporary] (1 in 500), bleeding (1 in 200), allergic reaction [possibly serious] (1 in 200)], benefits (diagnostic support and management of coronary artery disease) and alternatives of a cardiac catheterization were discussed in detail with Ms. Sebastian and she is willing to proceed.    Mitral Valve Prolapse Patient has a self reported history of mitral valve prolapse. No Echo in [facility] system or in Care Everywhere.  - Echo scheduled for 01/18/2024.  History of SVT Sinus Bradycardia  Patient has a self reported history of SVT that was diagnosed several years ago and she was started on Toprol -XL.  However, this was decreased at last visit due to bradycardia with rates in  the 40s. - EKG shows sinus bradycardia with rate of 52 bpm. She does describe some fatigue but is otherwise asymptomatic with this. - Continue Toprol -XL 25mg  daily.   Hypertension BP well controlled. - Continue current medications: Telmisartan  80mg  daily, Toprol -XL 25mg  daily, and HCTZ 25mg  daily.  - Of note, previously on Amlodipine  but this was stopped due to lower extremity edema.  Hyperlipidemia Per patient report, LDL was around 120 when PCP last checked labs in 08/2023. Unable to see these labs.  - Patient was started on Crestor  40mg  daily after recent coronary CTA. Continue. - Will repeat lipid panel and LFTs in 2-3 months. Can order at follow-up visit.  Disposition: Follow up in 2-3 weeks after cardiac catheterization.   Signed, Aline FORBES Door, PA-C  01/04/2024 1:38 PM    [facility]

## 2024-01-03 DIAGNOSIS — M545 Low back pain, unspecified: Secondary | ICD-10-CM | POA: Diagnosis not present

## 2024-01-03 DIAGNOSIS — M961 Postlaminectomy syndrome, not elsewhere classified: Secondary | ICD-10-CM | POA: Diagnosis not present

## 2024-01-04 ENCOUNTER — Ambulatory Visit: Attending: Student | Admitting: Student

## 2024-01-04 ENCOUNTER — Encounter: Payer: Self-pay | Admitting: Student

## 2024-01-04 VITALS — BP 124/78 | HR 52 | Ht 66.0 in | Wt 170.4 lb

## 2024-01-04 DIAGNOSIS — R0789 Other chest pain: Secondary | ICD-10-CM | POA: Diagnosis not present

## 2024-01-04 DIAGNOSIS — E785 Hyperlipidemia, unspecified: Secondary | ICD-10-CM | POA: Diagnosis not present

## 2024-01-04 DIAGNOSIS — I10 Essential (primary) hypertension: Secondary | ICD-10-CM

## 2024-01-04 DIAGNOSIS — I25119 Atherosclerotic heart disease of native coronary artery with unspecified angina pectoris: Secondary | ICD-10-CM | POA: Diagnosis not present

## 2024-01-04 DIAGNOSIS — I341 Nonrheumatic mitral (valve) prolapse: Secondary | ICD-10-CM | POA: Diagnosis not present

## 2024-01-04 DIAGNOSIS — I471 Supraventricular tachycardia, unspecified: Secondary | ICD-10-CM

## 2024-01-04 MED ORDER — ISOSORBIDE MONONITRATE ER 30 MG PO TB24: 30.0000 mg | ORAL_TABLET | Freq: Every day | ORAL | 2 refills | Status: DC

## 2024-01-04 MED ORDER — NITROGLYCERIN 0.4 MG SL SUBL: 0.4000 mg | SUBLINGUAL_TABLET | SUBLINGUAL | 1 refills | Status: DC | PRN

## 2024-01-04 NOTE — Patient Instructions (Signed)
 Medication Instructions:   START TAKING ISOSORBIDE MONONITRATE (IMDUR) 30 MG BY MOUTH DAILY  START TAKING NITROGLYCERIN  0.4 MG SUBLINGUAL (UNDER THE TONGUE)--PLACE ONE TABLET UNDER THE TONGUE EVERY 5 MINUTES AS NEEDED FOR CHEST PAIN--IF YOU ARE GETTING TO TABLET #3 WITH NO CHEST PAIN RELIEF, PLEASE CALL [phone]  *If you need a refill on your cardiac medications before your next appointment, please call your pharmacy*    Testing/Procedures:  Your physician has requested that you have a LEFT cardiac catheterization. Cardiac catheterization is used to diagnose and/or treat various heart conditions. Doctors may recommend this procedure for a number of different reasons. The most common reason is to evaluate chest pain. Chest pain can be a symptom of coronary artery disease (CAD), and cardiac catheterization can show whether plaque is narrowing or blocking your heart's arteries. This procedure is also used to evaluate the valves, as well as measure the blood flow and oxygen levels in different parts of your heart. For further information please visit [email or web address] Please follow instruction sheet, as given. ARRANGE WITH DR. Arlester Ladd   Follow-Up:  WITH CALLIE GOODRICH PA-C ON JULY 7, AT 2:20 PM       [facility] A DEPT OF [facility] [address] A DEPT OF THE [facility] [address] [place] [address] Dept: [phone] Loc: [phone]  Christine Hart  01/04/2024  You are scheduled for a Cardiac Catheterization on Monday, June 23 with Dr. Arnoldo Lapping.  1. Please arrive at the [facility] (Main Entrance A) at [facility]: [address] [place], [place] [address] at 7:00 AM (This time is 2 hour(s) before your procedure to ensure your preparation).   Free valet parking service is available. You will check in at ADMITTING. The support person will be asked to wait in the waiting room.  It is OK to have someone drop you  off and come back when you are ready to be discharged.    Special note: Every effort is made to have your procedure done on time. Please understand that emergencies sometimes delay scheduled procedures.  2. Diet: Do not eat solid foods after midnight.  The patient may have clear liquids until 5am upon the day of the procedure.  3. Labs: None needed  4. Medication instructions in preparation for your procedure:    Hold hydrochlorothiazide morning of cath      Drink 16 ounces of water before you leave home        On the morning of your procedure, take your Aspirin  81 mg and any morning medicines NOT listed above.  You may use sips of water.  5. Plan to go home the same day, you will only stay overnight if medically necessary. 6. Bring a current list of your medications and current insurance cards. 7. You MUST have a responsible person to drive you home. 8. Someone MUST be with you the first 24 hours after you arrive home or your discharge will be delayed. 9. Please wear clothes that are easy to get on and off and wear slip-on shoes.  Thank you for allowing us  to care for you!   -- [facility]

## 2024-01-09 DIAGNOSIS — C4401 Basal cell carcinoma of skin of lip: Secondary | ICD-10-CM | POA: Diagnosis not present

## 2024-01-10 ENCOUNTER — Telehealth: Payer: Self-pay | Admitting: *Deleted

## 2024-01-10 DIAGNOSIS — M5416 Radiculopathy, lumbar region: Secondary | ICD-10-CM | POA: Diagnosis not present

## 2024-01-10 NOTE — Telephone Encounter (Signed)
 Cardiac Catheterization scheduled at [facility] for: Monday January 15, 2024 9 AM Arrival time [facility] Main Entrance A at: 7 AM  Nothing to eat after midnight prior to procedure, clear liquids until 5 AM day of procedure.  Medication instructions: -Hold:  Hydrochlorothiazide-AM of procedure  -Other usual morning medications can be taken with sips of water including aspirin  81 mg.  Plan to go home the same day, you will only stay overnight if medically necessary.  You must have responsible adult to drive you home.  Someone must be with you the first 24 hours after you arrive home.  Reviewed procedure instructions with patient.

## 2024-01-15 ENCOUNTER — Other Ambulatory Visit: Payer: Self-pay

## 2024-01-15 ENCOUNTER — Ambulatory Visit (HOSPITAL_COMMUNITY)
Admission: RE | Admit: 2024-01-15 | Discharge: 2024-01-15 | Disposition: A | Discharge status: Home / Self Care | Attending: Cardiovascular Disease | Admitting: Cardiovascular Disease

## 2024-01-15 ENCOUNTER — Encounter (HOSPITAL_COMMUNITY)
Admission: RE | Disposition: A | Discharge status: Home / Self Care | Payer: Self-pay | Source: Home / Self Care | Attending: Cardiovascular Disease

## 2024-01-15 DIAGNOSIS — F419 Anxiety disorder, unspecified: Secondary | ICD-10-CM | POA: Diagnosis not present

## 2024-01-15 DIAGNOSIS — Z7982 Long term (current) use of aspirin: Secondary | ICD-10-CM | POA: Diagnosis not present

## 2024-01-15 DIAGNOSIS — I10 Essential (primary) hypertension: Secondary | ICD-10-CM | POA: Diagnosis not present

## 2024-01-15 DIAGNOSIS — G8929 Other chronic pain: Secondary | ICD-10-CM | POA: Insufficient documentation

## 2024-01-15 DIAGNOSIS — I341 Nonrheumatic mitral (valve) prolapse: Secondary | ICD-10-CM | POA: Diagnosis not present

## 2024-01-15 DIAGNOSIS — E785 Hyperlipidemia, unspecified: Secondary | ICD-10-CM | POA: Insufficient documentation

## 2024-01-15 DIAGNOSIS — Z8679 Personal history of other diseases of the circulatory system: Secondary | ICD-10-CM | POA: Insufficient documentation

## 2024-01-15 DIAGNOSIS — Z79899 Other long term (current) drug therapy: Secondary | ICD-10-CM | POA: Diagnosis not present

## 2024-01-15 DIAGNOSIS — I25119 Atherosclerotic heart disease of native coronary artery with unspecified angina pectoris: Secondary | ICD-10-CM

## 2024-01-15 SURGERY — LEFT HEART CATH AND CORONARY ANGIOGRAPHY
Anesthesia: LOCAL

## 2024-01-15 MED ORDER — ASPIRIN 81 MG PO CHEW: 81.0000 mg | CHEWABLE_TABLET | ORAL | Status: DC

## 2024-01-15 MED ORDER — LIDOCAINE HCL (PF) 1 % IJ SOLN
INTRAMUSCULAR | Status: DC | PRN
  Administered 2024-01-15: 2 mL

## 2024-01-15 MED ORDER — SODIUM CHLORIDE 0.9 % IV SOLN: 250.0000 mL | INTRAVENOUS | Status: DC | PRN

## 2024-01-15 MED ORDER — FENTANYL CITRATE (PF) 100 MCG/2ML IJ SOLN
INTRAMUSCULAR | Status: AC
  Filled 2024-01-15: qty 2

## 2024-01-15 MED ORDER — HEPARIN SODIUM (PORCINE) 1000 UNIT/ML IJ SOLN
INTRAMUSCULAR | Status: AC
  Filled 2024-01-15: qty 10

## 2024-01-15 MED ORDER — SODIUM CHLORIDE 0.9 % WEIGHT BASED INFUSION: 1.0000 mL/kg/h | INTRAVENOUS | Status: DC

## 2024-01-15 MED ORDER — SODIUM CHLORIDE 0.9% FLUSH: 3.0000 mL | INTRAVENOUS | Status: DC | PRN

## 2024-01-15 MED ORDER — ACETAMINOPHEN 325 MG PO TABS: 650.0000 mg | ORAL_TABLET | ORAL | Status: DC | PRN

## 2024-01-15 MED ORDER — FENTANYL CITRATE (PF) 100 MCG/2ML IJ SOLN
INTRAMUSCULAR | Status: DC | PRN
  Administered 2024-01-15: 25 ug via INTRAVENOUS

## 2024-01-15 MED ORDER — LIDOCAINE HCL (PF) 1 % IJ SOLN
INTRAMUSCULAR | Status: AC
Start: 2024-01-15 — End: 2024-01-15
  Filled 2024-01-15: qty 30

## 2024-01-15 MED ORDER — VERAPAMIL HCL 2.5 MG/ML IV SOLN
INTRAVENOUS | Status: DC | PRN
  Administered 2024-01-15: 10 mL via INTRA_ARTERIAL

## 2024-01-15 MED ORDER — LABETALOL HCL 5 MG/ML IV SOLN: 10.0000 mg | INTRAVENOUS | Status: DC | PRN

## 2024-01-15 MED ORDER — SODIUM CHLORIDE 0.9 % WEIGHT BASED INFUSION: 3.0000 mL/kg/h | INTRAVENOUS | Status: AC

## 2024-01-15 MED ORDER — HEPARIN (PORCINE) IN NACL 1000-0.9 UT/500ML-% IV SOLN
INTRAVENOUS | Status: DC | PRN
  Administered 2024-01-15: 1000 mL

## 2024-01-15 MED ORDER — ONDANSETRON HCL 4 MG/2ML IJ SOLN: 4.0000 mg | Freq: Four times a day (QID) | INTRAMUSCULAR | Status: DC | PRN

## 2024-01-15 MED ORDER — MIDAZOLAM HCL 2 MG/2ML IJ SOLN
INTRAMUSCULAR | Status: AC
  Filled 2024-01-15: qty 2

## 2024-01-15 MED ORDER — SODIUM CHLORIDE 0.9% FLUSH: 3.0000 mL | Freq: Two times a day (BID) | INTRAVENOUS | Status: DC

## 2024-01-15 MED ORDER — VERAPAMIL HCL 2.5 MG/ML IV SOLN
INTRAVENOUS | Status: AC
  Filled 2024-01-15: qty 2

## 2024-01-15 MED ORDER — HYDRALAZINE HCL 20 MG/ML IJ SOLN: 10.0000 mg | INTRAMUSCULAR | Status: DC | PRN

## 2024-01-15 MED ORDER — IOHEXOL 350 MG/ML SOLN
INTRAVENOUS | Status: DC | PRN
  Administered 2024-01-15: 70 mL

## 2024-01-15 MED ORDER — MIDAZOLAM HCL 2 MG/2ML IJ SOLN
INTRAMUSCULAR | Status: DC | PRN
  Administered 2024-01-15: 2 mg via INTRAVENOUS

## 2024-01-15 MED ORDER — HEPARIN SODIUM (PORCINE) 1000 UNIT/ML IJ SOLN
INTRAMUSCULAR | Status: DC | PRN
  Administered 2024-01-15: 4000 [IU] via INTRAVENOUS

## 2024-01-15 SURGICAL SUPPLY — 6 items
CATH 5FR JL3.5 JR4 ANG PIG MP (CATHETERS) IMPLANT
DEVICE RAD COMP TR BAND LRG (VASCULAR PRODUCTS) IMPLANT
GLIDESHEATH SLEND SS 6F .021 (SHEATH) IMPLANT
GUIDEWIRE INQWIRE 1.5J.035X260 (WIRE) IMPLANT
PACK CARDIAC CATHETERIZATION (CUSTOM PROCEDURE TRAY) ×1 IMPLANT
SET ATX-X65L (MISCELLANEOUS) IMPLANT

## 2024-01-15 NOTE — Interval H&P Note (Signed)
 History and Physical Interval Note:  01/15/2024 8:57 AM  Christine Hart  has presented today for surgery, with the diagnosis of abnormal cardiac ct.  The various methods of treatment have been discussed with the patient and family. After consideration of risks, benefits and other options for treatment, the patient has consented to  Procedure(s): LEFT HEART CATH AND CORONARY ANGIOGRAPHY (N/A) as a surgical intervention.  The patient's history has been reviewed, patient examined, no change in status, stable for surgery.  I have reviewed the patient's chart and labs.  Questions were answered to the patient's satisfaction.     Ozell Fell

## 2024-01-15 NOTE — Discharge Instructions (Signed)
 Radial Site Care The following information offers guidance on how to care for yourself after your procedure. Your health care provider may also give you more specific instructions. If you have problems or questions, contact your health care provider. What can I expect after the procedure? After the procedure, it is common to have bruising and tenderness in the incision area. Follow these instructions at home: Incision site care  Follow instructions from your health care provider about how to take care of your incision site. Make sure you: Wash your hands with soap and water for at least 20 seconds before and after you change your bandage (dressing). If soap and water are not available, use hand sanitizer. Remove your dressing in 24 hours. Leave stitches (sutures), skin glue, or adhesive strips in place. These skin closures may need to stay in place for 2 weeks or longer. If adhesive strip edges start to loosen and curl up, you may trim the loose edges. Do not remove adhesive strips completely unless your health care provider tells you to do that. Do not take baths, swim, or use a hot tub for at least 1 week. You may shower 24 hours after the procedure or as told by your health care provider. Remove the dressing and gently wash the incision area with plain soap and water. Pat the area dry with a clean towel. Do not rub the site. That could cause bleeding. Do not apply powder or lotion to the site. Check your incision site every day for signs of infection. Check for: Redness, swelling, or pain. Fluid or blood. Warmth. Pus or a bad smell. Activity For 24 hours after the procedure, or as directed by your health care provider: Do not flex or bend the affected arm. Do not push or pull heavy objects with the affected arm. Do not operate machinery or power tools. Do not drive. You should not drive yourself home from the hospital or clinic if you go home during that time period. You may drive 24  hours after the procedure unless your health care provider tells you not to. Do not lift anything that is heavier than 10 lb (4.5 kg), or the limit that you are told, until your health care provider says that it is safe. Return to your normal activities as told by your health care provider. Ask your health care provider what activities are safe for you and when you can return to work. If you were given a sedative during the procedure, it can affect you for several hours. Do not drive or operate machinery until your health care provider says that it is safe. General instructions Take over-the-counter and prescription medicines only as told by your health care provider. If you will be going home right after the procedure, plan to have a responsible adult care for you for the time you are told. This is important. Keep all follow-up visits. This is important. Contact a health care provider if: You have a fever or chills. You have any of these signs of infection at your incision site: Redness, swelling, or pain. Fluid or blood. Warmth. Pus or a bad smell. Get help right away if: The incision area swells very fast. The incision area is bleeding, and the bleeding does not stop when you hold steady pressure on the area. Your arm or hand becomes pale, cool, tingly, or numb. These symptoms may represent a serious problem that is an emergency. Do not wait to see if the symptoms will go away. Get medical  help right away. Call your local emergency services (911 in the [place] Do not drive yourself to the hospital. Summary After the procedure, it is common to have bruising and tenderness at the incision site. Follow instructions from your health care provider about how to take care of your radial site incision. Check the incision every day for signs of infection. Do not lift anything that is heavier than 10 lb (4.5 kg), or the limit that you are told, until your health care provider says that it is  safe. Get help right away if the incision area swells very fast, you have bleeding at the incision site that will not stop, or your arm or hand becomes pale, cool, or numb. This information is not intended to replace advice given to you by your health care provider. Make sure you discuss any questions you have with your health care provider. Document Revised: 08/30/2020 Document Reviewed: 08/30/2020 Elsevier Patient Education  2024 [employer].

## 2024-01-16 ENCOUNTER — Encounter (HOSPITAL_COMMUNITY): Payer: Self-pay | Admitting: Cardiovascular Disease

## 2024-01-18 ENCOUNTER — Ambulatory Visit (HOSPITAL_COMMUNITY)
Admission: RE | Admit: 2024-01-18 | Discharge: 2024-01-18 | Disposition: A | Discharge status: Home / Self Care | Source: Ambulatory Visit | Attending: Cardiovascular Disease | Admitting: Cardiovascular Disease

## 2024-01-18 DIAGNOSIS — I082 Rheumatic disorders of both aortic and tricuspid valves: Secondary | ICD-10-CM

## 2024-01-18 DIAGNOSIS — R072 Precordial pain: Secondary | ICD-10-CM

## 2024-01-18 LAB — ECHOCARDIOGRAM COMPLETE
Area-P 1/2: 3.88 cm2
S' Lateral: 2.4 cm

## 2024-01-18 NOTE — Progress Notes (Deleted)
 Cardiology Office Note:    Date:  01/18/2024   ID:  Terra Aveni, DOB [birth year], MRN [id number]  PCP:  Street, Lonni HERO, MD  Cardiologist:  Ozell Fell, MD Cardiology APP:  Zailey Audia E, PA-C { Click to update primary MD,subspecialty MD or APP then REFRESH:1}    Referring MD: Street, Lonni HERO, *   Chief Complaint: follow-up after cardiac catheterization   History of Present Illness:    Christine Hart is a 78 y.o. female with a history of severe multivessel CAD noted on recent cardiac catheterization on 01/15/2024, mitral valve prolapse, SVT, hypertension, hyperlipidemia, arthritis, chronic back pain, and anxiety who presents today for follow-up after cardiac catheterization.  Patient was first seen by me as a new patient on 12/12/2023 for further evaluation of hypertension. At that time, she reported she was first diagnosed with hypertension about 35 years ago. Her BP had reportedly been as high as 200-220/110 in the past but her BP was doing well on her current medications. However, at that visit, she reported occasional dull chest pain/ pressure at rest that last for 30-45 seconds a time. She also reported a couple of episodes that woke her up from sleep a couple times in the middle of the night. She denied any exertional chest pain. She also reported history a remote history of mitral valve prolapse and SVT but denied any significant palpitations. Coronary CTA and Echo were ordered for further evaluation. Coronary CTA showed a coronary calcium  score of 1,082 and total plaque volume of 761 (85th percentile for age and sex) as well as severe stenosis of proximal/ mid LAD, severe stenosis of ostial D2, moderate stenosis of LCX, and moderate stenosis of OM2. FFR showed lesions in the LAD, D2, and LCX were hemodynamically significant. This led to a cardiac catheterization on 01/15/2024 which showed severe diffuse multivessel disease.  She was referred to CT surgery as an  outpatient for consideration of CABG.  Echo on 01/18/2024 showed LVEF of 70-75% with normal wall motion and diastolic parameters, normal RV function, and no significant valvular disease.   She was seen by Dr. Shyrl with CT surgery on 01/19/2024 and CABG was recommended. This has been scheduled for 02/01/2024.  Patient presents today for follow-up. ***  CAD Patient reported occasional chest pressure/ dullness at last visit in 11/2023. Coronary CTA was ordered and showed  a coronary calcium  score of 1,082 and total plaque volume of 761 (85th percentile for age and sex) as well as severe stenosis of proximal/ mid LAD, severe stenosis of ostial D2, moderate stenosis of LCX, and moderate stenosis of OM2. FFR showed lesions in the LAD, D2, and LCX were hemodynamically significant. This led to a cardiac catheterization on 01/15/2024 which showed severe multivessel CAD. She was referred to CT Surgery and CABG is scheduled for 02/01/2024. - *** - Continue Toprol -XL 25mg  daily and Imdur  30mg  daily.  - Continue Aspirin  81mg  daily and Crestor  40mg  daily.       Mitral Valve Prolapse Patient has a self reported history of mitral valve prolapse. Echo on 01/18/2024 showed no evidence of mitral valve prolapse and only trivial MR.   History of SVT Sinus Bradycardia  Patient has a self reported history of SVT that was diagnosed several years ago and she was started on Toprol -XL.   - *** - Continue Toprol -XL 25mg  daily.    Hypertension BP well controlled. - Continue current medications: Telmisartan  80mg  daily, Toprol -XL 25mg  daily, HCTZ 25mg  daily, and Imdur  30mg  daily.  Hyperlipidemia Per patient report, LDL was around 120 when PCP last checked labs in 08/2023. Unable to see these labs.  - Patient was started on Crestor  40mg  daily after recent coronary CTA in 12/2021. Continue. - Will repeat lipid panel and LFTs in about 1 month. ***   EKGs/Labs/Other Studies Reviewed:    The following studies were  reviewed:  Coronary CTA 12/25/2023: Impression: 1. Coronary calcium  score is 1082. Unclear race precludes accurate percentile analysis. 2. Total plaque volume (TPV) is 761 mm3, categorized as extensive, which is 85th percentile for age- and sex-matched controls. 3. Normal coronary origins with right dominance. 4. CAD-RADS 4 Severe coronary artery disease. 5. Severe stenosis (70-99%) at the proximal/mid LAD due to mixed plaque. 6. Moderate stenosis (50-69%) due to soft plaque at mid LCX. 7. Moderate stenosis (50-69%) at the ostium of OM2. 8. Severe stenosis (70-99%) due to mixed plaque at the ostial D2 segment. 9. Patent foramen ovale. 10. CT FFR will be performed and reported separately.   FFR Summary: 1. CT FFR analysis showed significant stenosis in proximal / mid LAD and mid/distal LAD (this may be due to both disease burden and vessel tapering), clinical correlation required. 2. CT FFR analysis showed significant stenosis in second diagonal branch. 3.  CT FFR analysis showed significant stenosis in mid LCX.   Recommendations: Recommend invasive angiography if no contraindications, clinical correlation required. _______________  Left Cardiac Catheterization 01/15/2024: 1.  Patent left main with no significant stenosis 2.  Severe diffuse LAD stenosis with multiple lesions in the proximal, mid, and apical portions.  The LAD wraps around the apex and supplies much of the inferior wall. 3.  Severe mid circumflex and first obtuse marginal stenoses 4.  Moderate to severe proximal and mid RCA stenoses 5.  Vigorous LV systolic function with LVEF greater than 65% and no regional wall motion abnormalities, moderately elevated LVEDP   Reviewed findings with patient.  Recommend outpatient cardiac surgical evaluation for consideration of CABG.  Difficult anatomy with diffuse disease including distal vessel disease.  RCA supplies small territory of myocardium due to large wraparound LAD.  I  think her treatment options are likely limited to CABG versus aggressive medical therapy as she has very diffuse disease involvement for PCI.  Diagnostic Dominance: Right  _______________  Echocardiogram 01/18/2024: Impressions: 1. Left ventricular ejection fraction, by estimation, is 70 to 75%. The  left ventricle has hyperdynamic function. The left ventricle has no  regional wall motion abnormalities. Left ventricular diastolic parameters  were normal. The average left  ventricular global longitudinal strain is -27.2 %. The global longitudinal  strain is normal.   2. Right ventricular systolic function is normal. The right ventricular  size is normal.   3. The mitral valve is normal in structure. Trivial mitral valve  regurgitation. No evidence of mitral stenosis.   4. The aortic valve is tricuspid. There is mild calcification of the  aortic valve. Aortic valve regurgitation is not visualized. Aortic valve  sclerosis/calcification is present, without any evidence of aortic  stenosis.   5. The inferior vena cava is normal in size with greater than 50%  respiratory variability, suggesting right atrial pressure of 3 mmHg.     EKG:  EKG not ordered today.   Recent Labs: 12/27/2023: BUN 16; Creatinine, Ser 0.96; Hemoglobin 13.2; Platelets 228; Potassium 4.3; Sodium 136  Recent Lipid Panel No results found for: CHOL, TRIG, HDL, CHOLHDL, VLDL, LDLCALC, LDLDIRECT  Physical Exam:    Vital Signs: There were no  vitals taken for this visit.    Wt Readings from Last 3 Encounters:  01/15/24 170 lb (77.1 kg)  01/04/24 170 lb 6.4 oz (77.3 kg)  12/12/23 171 lb 6.4 oz (77.7 kg)     General: 78 y.o. female in no acute distress. HEENT: Normocephalic and atraumatic. Sclera clear.  Neck: Supple. No carotid bruits. No JVD. Heart: *** RRR. Distinct S1 and S2. No murmurs, gallops, or rubs.  Lungs: No increased work of breathing. Clear to ausculation bilaterally. No wheezes,  rhonchi, or rales.  Abdomen: Soft, non-distended, and non-tender to palpation.  Extremities: No lower extremity edema.  Radial and distal pedal pulses 2+ and equal bilaterally. Skin: Warm and dry. Neuro: No focal deficits. Psych: Normal affect. Responds appropriately.   Assessment:    No diagnosis found.  Plan:     Disposition: Follow up in ***   Signed, Aline FORBES Door, PA-C  01/18/2024 10:42 AM    [facility]

## 2024-01-19 ENCOUNTER — Ambulatory Visit
Attending: Thoracic Surgery (Cardiothoracic Vascular Surgery) | Admitting: Thoracic Surgery (Cardiothoracic Vascular Surgery)

## 2024-01-19 VITALS — BP 147/75 | HR 62 | Resp 20 | Ht 66.0 in | Wt 171.1 lb

## 2024-01-19 DIAGNOSIS — I25119 Atherosclerotic heart disease of native coronary artery with unspecified angina pectoris: Secondary | ICD-10-CM

## 2024-01-19 DIAGNOSIS — I2510 Atherosclerotic heart disease of native coronary artery without angina pectoris: Secondary | ICD-10-CM | POA: Insufficient documentation

## 2024-01-19 NOTE — Progress Notes (Signed)
 [address]       [place] [address]             [phone]        [facility] Medical Record #[id number] Date of Birth: 01-Jul-1946  Referring: Wonda Sharper, MD Primary Care: Street, Lonni HERO, MD Primary Cardiologist:Michael Wonda, MD  Chief Complaint:    Chief Complaint  Patient presents with   Coronary Artery Disease    New patient consult, CATH 6/23, ECHO 6/26    History of Present Illness:     Christine Hart is a 78 y.o. female who presents for surgical evaluation of multivessel CAD.  She has been symptomatic with occasional chest tightness, and pressure.  This occur when she is anxious or sleeping.  She also reports some fatigue that has persisted for several months.      Past Medical and Surgical History: Previous Chest Surgery: right trans-axillary cervical rib removal Previous Chest Radiation: no Diabetes Mellitus: no.  HbA1C pending Creatinine:  Lab Results  Component Value Date   CREATININE 0.96 12/27/2023   CREATININE 0.82 12/01/2022   CREATININE 0.48 10/30/2021     Past Medical History:  Diagnosis Date   Anxiety    Arthritis    Cancer (HCC)    basal and squamous cell carcinoma. Lesion excisions in 2023.   Complication of anesthesia    Limited mouth opening and neck mobility, s/p TMJ surgeries    Hypertension    MVP (mitral valve prolapse)    Pneumonia 2023   Hospitalized for 2 nights, [place].   PONV (postoperative nausea and vomiting)     Past Surgical History:  Procedure Laterality Date   ABDOMINAL HYSTERECTOMY     APPENDECTOMY     CERVICAL SPINE SURGERY     FIRST RIB REMOVAL Right    HAND SURGERY Bilateral    LEFT HEART CATH AND CORONARY ANGIOGRAPHY N/A 01/15/2024   Procedure: LEFT HEART CATH AND CORONARY ANGIOGRAPHY;  Surgeon: Wonda Sharper, MD;  Location: [facility] INVASIVE CV LAB;  Service: Cardiovascular;  Laterality: N/A;   NEUROMA SURGERY Left    POSTERIOR CERVICAL FUSION/FORAMINOTOMY  N/A 02/16/2017   Procedure: CERVICAL 1- CERVICAL 2 POSTERIOR INSTRUMENTATION AND FUSION;  Surgeon: Mavis Purchase, MD;  Location: [facility] OR;  Service: Neurosurgery;  Laterality: N/A;  CERVICAL 1- CERVICAL 2 POSTERIOR INSTRUMENTATION AND FUSION   SQUAMOUS CELL CARCINOMA EXCISION  2023   2 on right leg and 1 on nose.   TMJ ARTHROPLASTY      Social History:  Social History   Tobacco Use  Smoking Status Never  Smokeless Tobacco Never    Social History   Substance and Sexual Activity  Alcohol Use Yes   Alcohol/week: 3.0 standard drinks of alcohol   Types: 3 Shots of liquor per week   Comment: 3 drinks a week.     No Known Allergies    Current Outpatient Medications  Medication Sig Dispense Refill   ALPRAZolam  (XANAX ) 0.5 MG tablet Take 0.5 mg by mouth 3 (three) times daily as needed for anxiety.     aspirin  EC 81 MG tablet Take 1 tablet (81 mg total) by mouth daily. Swallow whole.     escitalopram (LEXAPRO) 10 MG tablet Take 10 mg by mouth daily.     hydrochlorothiazide (HYDRODIURIL) 25 MG tablet Take 25 mg by mouth daily.     isosorbide  mononitrate (IMDUR ) 30 MG 24 hr tablet Take 1 tablet (30 mg total) by mouth daily.  90 tablet 2   metoprolol  succinate (TOPROL -XL) 25 MG 24 hr tablet Take 1 tablet (25 mg total) by mouth daily. 90 tablet 3   nitroGLYCERIN  (NITROSTAT ) 0.4 MG SL tablet Place 1 tablet (0.4 mg total) under the tongue every 5 (five) minutes as needed for chest pain. 90 tablet 1   rosuvastatin  (CRESTOR ) 40 MG tablet Take 1 tablet (40 mg total) by mouth daily. 90 tablet 3   sulfamethoxazole-trimethoprim (BACTRIM DS) 800-160 MG tablet Take 1 tablet by mouth 2 (two) times daily.     telmisartan  (MICARDIS ) 80 MG tablet Take 1 tablet (80 mg total) by mouth daily. 30 tablet 2   traMADol  (ULTRAM ) 50 MG tablet Take 50 mg by mouth 4 (four) times daily as needed for moderate pain (pain score 4-6) or severe pain (pain score 7-10).     No current facility-administered medications  for this visit.    (Not in a hospital admission)   No family history on file.   Review of Systems:   Review of Systems  Constitutional:  Positive for malaise/fatigue. Negative for weight loss.  Respiratory:  Positive for shortness of breath.   Cardiovascular:  Positive for chest pain.  Neurological: Negative.       Physical Exam: BP (!) 147/75 (BP Location: Right Arm, Patient Position: Sitting, Cuff Size: Normal)   Pulse 62   Resp 20   Ht 5' 6 (1.676 m)   Wt 171 lb 1.6 oz (77.6 kg)   SpO2 97% Comment: RA  BMI 27.62 kg/m  Physical Exam Constitutional:      General: She is not in acute distress.    Appearance: She is not ill-appearing.  HENT:     Head: Normocephalic and atraumatic.   Eyes:     Extraocular Movements: Extraocular movements intact.    Cardiovascular:     Rate and Rhythm: Normal rate and regular rhythm.     Pulses: Normal pulses.  Pulmonary:     Effort: Pulmonary effort is normal. No respiratory distress.  Abdominal:     General: Abdomen is flat. There is no distension.   Musculoskeletal:        General: Normal range of motion.     Cervical back: Normal range of motion.   Skin:    General: Skin is warm and dry.     Comments: Lots of spider veins in her legs   Neurological:     General: No focal deficit present.     Mental Status: She is alert and oriented to person, place, and time.       Diagnostic Studies & Laboratory data:    Left Heart Catherization:  Intervention Echo: IMPRESSIONS     1. Left ventricular ejection fraction, by estimation, is 70 to 75%. The  left ventricle has hyperdynamic function. The left ventricle has no  regional wall motion abnormalities. Left ventricular diastolic parameters  were normal. The average left  ventricular global longitudinal strain is -27.2 %. The global longitudinal  strain is normal.   2. Right ventricular systolic function is normal. The right ventricular  size is normal.   3. The  mitral valve is normal in structure. Trivial mitral valve  regurgitation. No evidence of mitral stenosis.   4. The aortic valve is tricuspid. There is mild calcification of the  aortic valve. Aortic valve regurgitation is not visualized. Aortic valve  sclerosis/calcification is present, without any evidence of aortic  stenosis.   5. The inferior vena cava is normal in size with greater  than 50%  respiratory variability, suggesting right atrial pressure of 3 mmHg.     EKG: Sinus brady  I have independently reviewed the above radiologic studies and discussed with the patient   Recent Lab Findings: Lab Results  Component Value Date   WBC 7.3 12/27/2023   HGB 13.2 12/27/2023   HCT 41.3 12/27/2023   PLT 228 12/27/2023   GLUCOSE 92 12/27/2023   ALT 14 10/30/2021   AST 16 10/30/2021   NA 136 12/27/2023   K 4.3 12/27/2023   CL 97 12/27/2023   CREATININE 0.96 12/27/2023   BUN 16 12/27/2023   CO2 23 12/27/2023      Assessment / Plan:   78 y.o. female with 3V CAD, preserved biventricular function and no significant valvular disease.  On review of her LHC, she has several tandem lesions in her LAD, but I think that we should be able to bridge the midvessel lesion to cover a large territory.  She also will need grafting to the diagonal and an OM.  She would like to time to think about surgery.  If she agrees, she will need vein mapping to her lower extremities and assessment of her left radial artery, given all of her spider vein.  We discussed the option for CABG, with possible use of the left radial artery.    LAD (bridge), OM, diagonal    I  spent 40 minutes counseling the patient face to face.   Christine Hart 01/19/2024 9:09 AM

## 2024-01-19 NOTE — H&P (View-Only) (Signed)
 [address]       [place] [address]             [phone]        [facility] Medical Record #[id number] Date of Birth: 01-Jul-1946  Referring: Wonda Sharper, MD Primary Care: Street, Lonni HERO, MD Primary Cardiologist:Michael Wonda, MD  Chief Complaint:    Chief Complaint  Patient presents with   Coronary Artery Disease    New patient consult, CATH 6/23, ECHO 6/26    History of Present Illness:     Christine Hart is a 78 y.o. female who presents for surgical evaluation of multivessel CAD.  She has been symptomatic with occasional chest tightness, and pressure.  This occur when she is anxious or sleeping.  She also reports some fatigue that has persisted for several months.      Past Medical and Surgical History: Previous Chest Surgery: right trans-axillary cervical rib removal Previous Chest Radiation: no Diabetes Mellitus: no.  HbA1C pending Creatinine:  Lab Results  Component Value Date   CREATININE 0.96 12/27/2023   CREATININE 0.82 12/01/2022   CREATININE 0.48 10/30/2021     Past Medical History:  Diagnosis Date   Anxiety    Arthritis    Cancer (HCC)    basal and squamous cell carcinoma. Lesion excisions in 2023.   Complication of anesthesia    Limited mouth opening and neck mobility, s/p TMJ surgeries    Hypertension    MVP (mitral valve prolapse)    Pneumonia 2023   Hospitalized for 2 nights, [place].   PONV (postoperative nausea and vomiting)     Past Surgical History:  Procedure Laterality Date   ABDOMINAL HYSTERECTOMY     APPENDECTOMY     CERVICAL SPINE SURGERY     FIRST RIB REMOVAL Right    HAND SURGERY Bilateral    LEFT HEART CATH AND CORONARY ANGIOGRAPHY N/A 01/15/2024   Procedure: LEFT HEART CATH AND CORONARY ANGIOGRAPHY;  Surgeon: Wonda Sharper, MD;  Location: [facility] INVASIVE CV LAB;  Service: Cardiovascular;  Laterality: N/A;   NEUROMA SURGERY Left    POSTERIOR CERVICAL FUSION/FORAMINOTOMY  N/A 02/16/2017   Procedure: CERVICAL 1- CERVICAL 2 POSTERIOR INSTRUMENTATION AND FUSION;  Surgeon: Mavis Purchase, MD;  Location: [facility] OR;  Service: Neurosurgery;  Laterality: N/A;  CERVICAL 1- CERVICAL 2 POSTERIOR INSTRUMENTATION AND FUSION   SQUAMOUS CELL CARCINOMA EXCISION  2023   2 on right leg and 1 on nose.   TMJ ARTHROPLASTY      Social History:  Social History   Tobacco Use  Smoking Status Never  Smokeless Tobacco Never    Social History   Substance and Sexual Activity  Alcohol Use Yes   Alcohol/week: 3.0 standard drinks of alcohol   Types: 3 Shots of liquor per week   Comment: 3 drinks a week.     No Known Allergies    Current Outpatient Medications  Medication Sig Dispense Refill   ALPRAZolam  (XANAX ) 0.5 MG tablet Take 0.5 mg by mouth 3 (three) times daily as needed for anxiety.     aspirin  EC 81 MG tablet Take 1 tablet (81 mg total) by mouth daily. Swallow whole.     escitalopram (LEXAPRO) 10 MG tablet Take 10 mg by mouth daily.     hydrochlorothiazide (HYDRODIURIL) 25 MG tablet Take 25 mg by mouth daily.     isosorbide  mononitrate (IMDUR ) 30 MG 24 hr tablet Take 1 tablet (30 mg total) by mouth daily.  90 tablet 2   metoprolol  succinate (TOPROL -XL) 25 MG 24 hr tablet Take 1 tablet (25 mg total) by mouth daily. 90 tablet 3   nitroGLYCERIN  (NITROSTAT ) 0.4 MG SL tablet Place 1 tablet (0.4 mg total) under the tongue every 5 (five) minutes as needed for chest pain. 90 tablet 1   rosuvastatin  (CRESTOR ) 40 MG tablet Take 1 tablet (40 mg total) by mouth daily. 90 tablet 3   sulfamethoxazole-trimethoprim (BACTRIM DS) 800-160 MG tablet Take 1 tablet by mouth 2 (two) times daily.     telmisartan  (MICARDIS ) 80 MG tablet Take 1 tablet (80 mg total) by mouth daily. 30 tablet 2   traMADol  (ULTRAM ) 50 MG tablet Take 50 mg by mouth 4 (four) times daily as needed for moderate pain (pain score 4-6) or severe pain (pain score 7-10).     No current facility-administered medications  for this visit.    (Not in a hospital admission)   No family history on file.   Review of Systems:   Review of Systems  Constitutional:  Positive for malaise/fatigue. Negative for weight loss.  Respiratory:  Positive for shortness of breath.   Cardiovascular:  Positive for chest pain.  Neurological: Negative.       Physical Exam: BP (!) 147/75 (BP Location: Right Arm, Patient Position: Sitting, Cuff Size: Normal)   Pulse 62   Resp 20   Ht 5' 6 (1.676 m)   Wt 171 lb 1.6 oz (77.6 kg)   SpO2 97% Comment: RA  BMI 27.62 kg/m  Physical Exam Constitutional:      General: She is not in acute distress.    Appearance: She is not ill-appearing.  HENT:     Head: Normocephalic and atraumatic.   Eyes:     Extraocular Movements: Extraocular movements intact.    Cardiovascular:     Rate and Rhythm: Normal rate and regular rhythm.     Pulses: Normal pulses.  Pulmonary:     Effort: Pulmonary effort is normal. No respiratory distress.  Abdominal:     General: Abdomen is flat. There is no distension.   Musculoskeletal:        General: Normal range of motion.     Cervical back: Normal range of motion.   Skin:    General: Skin is warm and dry.     Comments: Lots of spider veins in her legs   Neurological:     General: No focal deficit present.     Mental Status: She is alert and oriented to person, place, and time.       Diagnostic Studies & Laboratory data:    Left Heart Catherization:  Intervention Echo: IMPRESSIONS     1. Left ventricular ejection fraction, by estimation, is 70 to 75%. The  left ventricle has hyperdynamic function. The left ventricle has no  regional wall motion abnormalities. Left ventricular diastolic parameters  were normal. The average left  ventricular global longitudinal strain is -27.2 %. The global longitudinal  strain is normal.   2. Right ventricular systolic function is normal. The right ventricular  size is normal.   3. The  mitral valve is normal in structure. Trivial mitral valve  regurgitation. No evidence of mitral stenosis.   4. The aortic valve is tricuspid. There is mild calcification of the  aortic valve. Aortic valve regurgitation is not visualized. Aortic valve  sclerosis/calcification is present, without any evidence of aortic  stenosis.   5. The inferior vena cava is normal in size with greater  than 50%  respiratory variability, suggesting right atrial pressure of 3 mmHg.     EKG: Sinus brady  I have independently reviewed the above radiologic studies and discussed with the patient   Recent Lab Findings: Lab Results  Component Value Date   WBC 7.3 12/27/2023   HGB 13.2 12/27/2023   HCT 41.3 12/27/2023   PLT 228 12/27/2023   GLUCOSE 92 12/27/2023   ALT 14 10/30/2021   AST 16 10/30/2021   NA 136 12/27/2023   K 4.3 12/27/2023   CL 97 12/27/2023   CREATININE 0.96 12/27/2023   BUN 16 12/27/2023   CO2 23 12/27/2023      Assessment / Plan:   78 y.o. female with 3V CAD, preserved biventricular function and no significant valvular disease.  On review of her LHC, she has several tandem lesions in her LAD, but I think that we should be able to bridge the midvessel lesion to cover a large territory.  She also will need grafting to the diagonal and an OM.  She would like to time to think about surgery.  If she agrees, she will need vein mapping to her lower extremities and assessment of her left radial artery, given all of her spider vein.  We discussed the option for CABG, with possible use of the left radial artery.    LAD (bridge), OM, diagonal    I  spent 40 minutes counseling the patient face to face.   Linnie MALVA Rayas 01/19/2024 9:09 AM

## 2024-01-21 DIAGNOSIS — M5416 Radiculopathy, lumbar region: Secondary | ICD-10-CM | POA: Diagnosis not present

## 2024-01-22 ENCOUNTER — Other Ambulatory Visit: Payer: Self-pay | Admitting: *Deleted

## 2024-01-22 ENCOUNTER — Encounter: Payer: Self-pay | Admitting: *Deleted

## 2024-01-22 DIAGNOSIS — I25119 Atherosclerotic heart disease of native coronary artery with unspecified angina pectoris: Secondary | ICD-10-CM

## 2024-01-29 ENCOUNTER — Ambulatory Visit: Admitting: Student

## 2024-01-30 NOTE — Pre-Procedure Instructions (Signed)
 Surgical Instructions   Your procedure is scheduled on Thursday, July 10th. Report to [facility] Main Entrance A at 05:30 A.M., then check in with the Admitting office. Any questions or running late day of surgery: call [phone]  Questions prior to your surgery date: call [phone], Monday-Friday, 8am-4pm. If you experience any cold or flu symptoms such as cough, fever, chills, shortness of breath, etc. between now and your scheduled surgery, please notify us  at the above number.     Remember:  Do not eat or drink after midnight the night before your surgery    Take these medicines the morning of surgery with A SIP OF WATER  escitalopram  (LEXAPRO )  isosorbide  mononitrate (IMDUR )  metoprolol  succinate (TOPROL -XL)  rosuvastatin  (CRESTOR )  sulfamethoxazole -trimethoprim  (BACTRIM  DS)   May take these medicines IF NEEDED: ALPRAZolam  (XANAX )  nitroGLYCERIN  (NITROSTAT )- If you have to take this medication prior to surgery, please call [phone] and report this to a nurse  traMADol  (ULTRAM )    One week prior to surgery, STOP taking any Aleve, Naproxen, Ibuprofen , Motrin , Advil , Goody's, BC's, all herbal medications, fish oil, and non-prescription vitamins.                     Do NOT Smoke (Tobacco/Vaping) for 24 hours prior to your procedure.  If you use a CPAP at night, you may bring your mask/headgear for your overnight stay.   You will be asked to remove any contacts, glasses, piercing's, hearing aid's, dentures/partials prior to surgery. Please bring cases for these items if needed.    Patients discharged the day of surgery will not be allowed to drive home, and someone needs to stay with them for 24 hours.  SURGICAL WAITING ROOM VISITATION Patients may have no more than 2 support people in the waiting area - these visitors may rotate.   Pre-op nurse will coordinate an appropriate time for 1 ADULT support person, who may not rotate, to accompany patient in pre-op.   Children under the age of 45 must have an adult with them who is not the patient and must remain in the main waiting area with an adult.  If the patient needs to stay at the hospital during part of their recovery, the visitor guidelines for inpatient rooms apply.  Please refer to the [facility] website for the visitor guidelines for any additional information.   If you received a COVID test during your pre-op visit  it is requested that you wear a mask when out in public, stay away from anyone that may not be feeling well and notify your surgeon if you develop symptoms. If you have been in contact with anyone that has tested positive in the last 10 days please notify you surgeon.      Pre-operative CHG Bathing Instructions   You can play a key role in reducing the risk of infection after surgery. Your skin needs to be as free of germs as possible. You can reduce the number of germs on your skin by washing with CHG (chlorhexidine  gluconate) soap before surgery. CHG is an antiseptic soap that kills germs and continues to kill germs even after washing.   DO NOT use if you have an allergy to chlorhexidine /CHG or antibacterial soaps. If your skin becomes reddened or irritated, stop using the CHG and notify one of our RNs at [phone].              TAKE A SHOWER THE NIGHT BEFORE SURGERY AND THE DAY OF SURGERY  Please keep in mind the following:  DO NOT shave, including legs and underarms, 48 hours prior to surgery.   You may shave your face before/day of surgery.  Place clean sheets on your bed the night before surgery Use a clean washcloth (not used since being washed) for each shower. DO NOT sleep with pet's night before surgery.  CHG Shower Instructions:  Wash your face and private area with normal soap. If you choose to wash your hair, wash first with your normal shampoo.  After you use shampoo/soap, rinse your hair and body thoroughly to remove shampoo/soap residue.  Turn the  water OFF and apply half the bottle of CHG soap to a CLEAN washcloth.  Apply CHG soap ONLY FROM YOUR NECK DOWN TO YOUR TOES (washing for 3-5 minutes)  DO NOT use CHG soap on face, private areas, open wounds, or sores.  Pay special attention to the area where your surgery is being performed.  If you are having back surgery, having someone wash your back for you may be helpful. Wait 2 minutes after CHG soap is applied, then you may rinse off the CHG soap.  Pat dry with a clean towel  Put on clean pajamas    Additional instructions for the day of surgery: DO NOT APPLY any lotions, deodorants, cologne, or perfumes.   Do not wear jewelry or makeup Do not wear nail polish, gel polish, artificial nails, or any other type of covering on natural nails (fingers and toes) Do not bring valuables to the hospital. [facility] is not responsible for valuables/personal belongings. Put on clean/comfortable clothes.  Please brush your teeth.  Ask your nurse before applying any prescription medications to the skin.

## 2024-01-31 ENCOUNTER — Ambulatory Visit (HOSPITAL_BASED_OUTPATIENT_CLINIC_OR_DEPARTMENT_OTHER)
Admission: RE | Admit: 2024-01-31 | Discharge: 2024-01-31 | Disposition: A | Discharge status: Home / Self Care | Source: Ambulatory Visit | Attending: Thoracic Surgery (Cardiothoracic Vascular Surgery) | Admitting: Thoracic Surgery (Cardiothoracic Vascular Surgery)

## 2024-01-31 ENCOUNTER — Inpatient Hospital Stay (HOSPITAL_COMMUNITY)
Admission: RE | Admit: 2024-01-31 | Discharge: 2024-01-31 | Disposition: A | Discharge status: Home / Self Care | Source: Ambulatory Visit | Attending: Thoracic Surgery (Cardiothoracic Vascular Surgery)

## 2024-01-31 ENCOUNTER — Encounter (HOSPITAL_COMMUNITY): Payer: Self-pay | Admitting: Thoracic Surgery (Cardiothoracic Vascular Surgery)

## 2024-01-31 ENCOUNTER — Encounter (HOSPITAL_COMMUNITY): Payer: Self-pay

## 2024-01-31 ENCOUNTER — Ambulatory Visit (HOSPITAL_COMMUNITY)
Admission: RE | Admit: 2024-01-31 | Discharge: 2024-01-31 | Disposition: A | Discharge status: Home / Self Care | Source: Ambulatory Visit | Attending: Thoracic Surgery (Cardiothoracic Vascular Surgery) | Admitting: Thoracic Surgery (Cardiothoracic Vascular Surgery)

## 2024-01-31 ENCOUNTER — Other Ambulatory Visit: Payer: Self-pay

## 2024-01-31 VITALS — BP 168/73 | HR 56 | Temp 98.0°F | Resp 18 | Ht 66.0 in | Wt 169.3 lb

## 2024-01-31 DIAGNOSIS — D65 Disseminated intravascular coagulation [defibrination syndrome]: Secondary | ICD-10-CM | POA: Diagnosis not present

## 2024-01-31 DIAGNOSIS — I2510 Atherosclerotic heart disease of native coronary artery without angina pectoris: Secondary | ICD-10-CM | POA: Diagnosis not present

## 2024-01-31 DIAGNOSIS — D62 Acute posthemorrhagic anemia: Secondary | ICD-10-CM | POA: Diagnosis not present

## 2024-01-31 DIAGNOSIS — I214 Non-ST elevation (NSTEMI) myocardial infarction: Secondary | ICD-10-CM | POA: Diagnosis not present

## 2024-01-31 DIAGNOSIS — Z01818 Encounter for other preprocedural examination: Secondary | ICD-10-CM

## 2024-01-31 DIAGNOSIS — I471 Supraventricular tachycardia, unspecified: Secondary | ICD-10-CM | POA: Diagnosis not present

## 2024-01-31 DIAGNOSIS — J90 Pleural effusion, not elsewhere classified: Secondary | ICD-10-CM | POA: Diagnosis not present

## 2024-01-31 DIAGNOSIS — E785 Hyperlipidemia, unspecified: Secondary | ICD-10-CM | POA: Diagnosis not present

## 2024-01-31 DIAGNOSIS — E1165 Type 2 diabetes mellitus with hyperglycemia: Secondary | ICD-10-CM | POA: Diagnosis not present

## 2024-01-31 DIAGNOSIS — I25119 Atherosclerotic heart disease of native coronary artery with unspecified angina pectoris: Secondary | ICD-10-CM | POA: Insufficient documentation

## 2024-01-31 DIAGNOSIS — I10 Essential (primary) hypertension: Secondary | ICD-10-CM | POA: Diagnosis not present

## 2024-01-31 DIAGNOSIS — G9340 Encephalopathy, unspecified: Secondary | ICD-10-CM | POA: Diagnosis not present

## 2024-01-31 DIAGNOSIS — M797 Fibromyalgia: Secondary | ICD-10-CM | POA: Diagnosis not present

## 2024-01-31 DIAGNOSIS — I4891 Unspecified atrial fibrillation: Secondary | ICD-10-CM | POA: Diagnosis not present

## 2024-01-31 HISTORY — DX: Fibromyalgia: M79.7

## 2024-01-31 LAB — VAS US DOPPLER PRE CABG
Left ABI: 1.14
Right ABI: 1.13

## 2024-01-31 LAB — CBC
HCT: 43.3 % (ref 36.0–46.0)
Hemoglobin: 14 g/dL (ref 12.0–15.0)
MCH: 30.5 pg (ref 26.0–34.0)
MCHC: 32.3 g/dL (ref 30.0–36.0)
MCV: 94.3 fL (ref 80.0–100.0)
Platelets: 241 K/uL (ref 150–400)
RBC: 4.59 MIL/uL (ref 3.87–5.11)
RDW: 12.7 % (ref 11.5–15.5)
WBC: 7.5 K/uL (ref 4.0–10.5)
nRBC: 0 % (ref 0.0–0.2)

## 2024-01-31 LAB — URINALYSIS, ROUTINE W REFLEX MICROSCOPIC
Bilirubin Urine: NEGATIVE
Glucose, UA: NEGATIVE mg/dL
Hgb urine dipstick: NEGATIVE
Ketones, ur: NEGATIVE mg/dL
Nitrite: NEGATIVE
Protein, ur: NEGATIVE mg/dL
Specific Gravity, Urine: 1.005 (ref 1.005–1.030)
pH: 7 (ref 5.0–8.0)

## 2024-01-31 LAB — COMPREHENSIVE METABOLIC PANEL WITH GFR
ALT: 15 U/L (ref 0–44)
AST: 20 U/L (ref 15–41)
Albumin: 4.3 g/dL (ref 3.5–5.0)
Alkaline Phosphatase: 67 U/L (ref 38–126)
Anion gap: 9 (ref 5–15)
BUN: 12 mg/dL (ref 8–23)
CO2: 28 mmol/L (ref 22–32)
Calcium: 9.9 mg/dL (ref 8.9–10.3)
Chloride: 101 mmol/L (ref 98–111)
Creatinine, Ser: 0.83 mg/dL (ref 0.44–1.00)
GFR, Estimated: >60 mL/min (ref >60)
Glucose, Bld: 96 mg/dL (ref 70–99)
Potassium: 4.5 mmol/L (ref 3.5–5.1)
Sodium: 138 mmol/L (ref 135–145)
Total Bilirubin: 0.7 mg/dL (ref 0.0–1.2)
Total Protein: 7.3 g/dL (ref 6.5–8.1)

## 2024-01-31 LAB — PROTIME-INR
INR: 0.9 (ref 0.8–1.2)
Prothrombin Time: 12.5 s (ref 11.4–15.2)

## 2024-01-31 LAB — APTT: aPTT: 28 s (ref 24–36)

## 2024-01-31 LAB — SURGICAL PCR SCREEN
MRSA, PCR: NEGATIVE
Staphylococcus aureus: NEGATIVE

## 2024-01-31 MED ORDER — INSULIN REGULAR(HUMAN) IN NACL 100-0.9 UT/100ML-% IV SOLN
INTRAVENOUS | Status: AC
  Administered 2024-02-01: 1.1 [IU]/h via INTRAVENOUS
  Filled 2024-01-31: qty 100

## 2024-01-31 MED ORDER — CEFAZOLIN SODIUM-DEXTROSE 2-4 GM/100ML-% IV SOLN
2.0000 g | INTRAVENOUS | Status: DC
  Filled 2024-01-31: qty 100

## 2024-01-31 MED ORDER — TRANEXAMIC ACID (OHS) PUMP PRIME SOLUTION
2.0000 mg/kg | INTRAVENOUS | Status: DC
  Filled 2024-01-31: qty 1.54

## 2024-01-31 MED ORDER — EPINEPHRINE HCL 5 MG/250ML IV SOLN IN NS
0.0000 ug/min | INTRAVENOUS | Status: DC
  Filled 2024-01-31: qty 250

## 2024-01-31 MED ORDER — NOREPINEPHRINE 4 MG/250ML-% IV SOLN
0.0000 ug/min | INTRAVENOUS | Status: DC
  Filled 2024-01-31: qty 250

## 2024-01-31 MED ORDER — MANNITOL 20 % IV SOLN
INTRAVENOUS | Status: DC
  Filled 2024-01-31: qty 13

## 2024-01-31 MED ORDER — TRANEXAMIC ACID (OHS) BOLUS VIA INFUSION
15.0000 mg/kg | INTRAVENOUS | Status: AC
  Administered 2024-02-01: 1152 mg via INTRAVENOUS

## 2024-01-31 MED ORDER — DEXMEDETOMIDINE HCL IN NACL 400 MCG/100ML IV SOLN
0.1000 ug/kg/h | INTRAVENOUS | Status: AC
  Administered 2024-02-01: .4 ug/kg/h via INTRAVENOUS
  Filled 2024-01-31: qty 100

## 2024-01-31 MED ORDER — MILRINONE LACTATE IN DEXTROSE 20-5 MG/100ML-% IV SOLN
0.3000 ug/kg/min | INTRAVENOUS | Status: DC
  Filled 2024-01-31: qty 100

## 2024-01-31 MED ORDER — CEFAZOLIN SODIUM-DEXTROSE 2-4 GM/100ML-% IV SOLN
2.0000 g | INTRAVENOUS | Status: AC
  Administered 2024-02-01 (×2): 2 g via INTRAVENOUS
  Filled 2024-01-31: qty 100

## 2024-01-31 MED ORDER — PLASMA-LYTE A IV SOLN
INTRAVENOUS | Status: DC
  Filled 2024-01-31: qty 2.5

## 2024-01-31 MED ORDER — POTASSIUM CHLORIDE 2 MEQ/ML IV SOLN
80.0000 meq | INTRAVENOUS | Status: DC
  Filled 2024-01-31: qty 40

## 2024-01-31 MED ORDER — VANCOMYCIN HCL 1250 MG/250ML IV SOLN
1250.0000 mg | INTRAVENOUS | Status: AC
  Administered 2024-02-01: 1250 mg via INTRAVENOUS
  Filled 2024-01-31: qty 250

## 2024-01-31 MED ORDER — PHENYLEPHRINE HCL-NACL 20-0.9 MG/250ML-% IV SOLN
30.0000 ug/min | INTRAVENOUS | Status: AC
  Administered 2024-02-01: 20 ug/min via INTRAVENOUS
  Filled 2024-01-31: qty 250

## 2024-01-31 MED ORDER — HEPARIN 30,000 UNITS/1000 ML (OHS) CELLSAVER SOLUTION
Status: DC
  Filled 2024-01-31: qty 1000

## 2024-01-31 MED ORDER — TRANEXAMIC ACID 1000 MG/10ML IV SOLN
1.5000 mg/kg/h | INTRAVENOUS | Status: AC
  Administered 2024-02-01: 1.5 mg/kg/h via INTRAVENOUS
  Filled 2024-01-31: qty 25

## 2024-01-31 MED ORDER — NITROGLYCERIN IN D5W 200-5 MCG/ML-% IV SOLN
2.0000 ug/min | INTRAVENOUS | Status: DC
  Filled 2024-01-31: qty 250

## 2024-01-31 NOTE — Progress Notes (Signed)
 Secure chat sent to Dr. Lightfoot and Allyssa Krusinski, RN with pt's UA results- small amount of leukocytes,rare bacteria.

## 2024-01-31 NOTE — Progress Notes (Signed)
 Anesthesia Chart Review:  Case: [id number] Date/Time: 02/01/24 0715   Procedures:      CORONARY ARTERY BYPASS GRAFTING (CABG) (Chest)     SURGICAL PROCUREMENT, ARTERY, RADIAL (Left: Arm Lower) - possible left radial artery harvest     ECHOCARDIOGRAM, TRANSESOPHAGEAL   Anesthesia type: General   Diagnosis: Coronary artery disease involving native coronary artery of native heart, unspecified whether angina present [I25.10]   Pre-op diagnosis: CAD   [facility] RO[facility] 15 / [facility]   Surgeons: Shyrl Linnie KIDD, MD       DISCUSSION: Patient is a 78 year old female scheduled for the above procedure.   History includes never smoker, post-operative N/V, DIFFICULT INTUBATION, HTN, CAD, MVP (normal MV structure, trivial MR 01/18/24 TTE), skin cancer (BCC; SCC excision 2023, MOHS 12/2023), anxiety, spinal surgery (C5-7 ACDF 05/26/11; C1-2 posterior fusion 02/16/17; L45- PSF 12/07/22), first rib resection (with right ulnar nerve decompression), TMJ arthroplasty (~ 30 years ago; reportedly had Teflon discs removed after ~ 5 years due to reaction of headaches/nausea).  Primary care notes indicate she also has a history of Polio at age 16 with ~ 2 weeks of LE paralysis.    Known potential Difficult Intubation due to limited mouth opening due to limited neck mobility (multiple c-spine surgeries) and mouth opening (related to TMJ/surgery) with prominent teeth.   -05/26/11: A McGrath video laryngoscopy was use to placed ETT for ACDF at [facility].  - 02/16/17: 3 and Glidescope used to place 7.0 mm ETT, mask ventilation without difficulty.  - 12/07/22: IV induction, mask ventilation without difficulty, Glidescope and 3, rigid stylet, Grade 1 view, 7.0 ETT, 1 attempt. Difficulty anticipated due to limited oral opening, reduced neck mobility.   Anesthesia team to evaluate on the day of surgery.   VS: BP (!) 168/73   Pulse (!) 56   Temp 36.7 C   Resp 18   Ht 5' 6 (1.676 m)   Wt  76.8 kg   SpO2 97%   BMI 27.33 kg/m    PROVIDERS: Street, Lonni HERO, MD is PCP [facility], phone [phone], fax [phone])  Wonda Sharper, MD is cardiologist   LABS: Labs reviewed: Acceptable for surgery. (all labs ordered are listed, but only abnormal results are displayed)  Labs Reviewed  URINALYSIS, ROUTINE W REFLEX MICROSCOPIC - Abnormal; Notable for the following components:      Result Value   Color, Urine STRAW (*)    Leukocytes,Ua SMALL (*)    Bacteria, UA RARE (*)    All other components within normal limits  SURGICAL PCR SCREEN  CBC  COMPREHENSIVE METABOLIC PANEL WITH GFR  PROTIME-INR  APTT  HEMOGLOBIN A1C  TYPE AND SCREEN    IMAGES: CXR 01/31/24: FINDINGS: Stable heart size and mediastinal contours.The cardiomediastinal contours are normal. Minor subsegmental scarring in the lingula. Pulmonary vasculature is normal. No consolidation, pleural effusion, or pneumothorax. No acute osseous abnormalities are seen. Right axillary/chest wall surgical clips. Postsurgical change in the lower cervical spine. IMPRESSION: No acute chest findings.   EKG: 01/04/24: Sinus bradycardia at 53 bpm No acute ischemic changes Confirmed by Goodrich, Callie [phone]) on 01/04/2024 1:18:29 PM   CV: US  Carotid 01/31/24 (PRELIMINARY): Summary:  - Right Carotid: The extracranial vessels were near-normal with only minimal  wall thickening or plaque.  - Left Carotid: The extracranial vessels were near-normal with only minimal  wall thickening or plaque.  - Vertebrals:  Bilateral vertebral arteries demonstrate antegrade flow.  - Subclavians: Normal flow hemodynamics  were seen in bilateral subclavian arteries.    Echo 01/18/24: IMPRESSIONS   1. Left ventricular ejection fraction, by estimation, is 70 to 75%. The left ventricle has hyperdynamic function. The left ventricle has no regional wall motion abnormalities. Left ventricular diastolic parameters were  normal. The average left  ventricular global longitudinal strain is -27.2 %. The global longitudinal strain is normal.   2. Right ventricular systolic function is normal. The right ventricular size is normal.   3. The mitral valve is normal in structure. Trivial mitral valve regurgitation. No evidence of mitral stenosis.   4. The aortic valve is tricuspid. There is mild calcification of the aortic valve. Aortic valve regurgitation is not visualized. Aortic valve  sclerosis/calcification is present, without any evidence of aortic stenosis.   5. The inferior vena cava is normal in size with greater than 50% respiratory variability, suggesting right atrial pressure of 3 mmHg.    Cardiac cath 01/15/24: 1.  Patent left main with no significant stenosis 2.  Severe diffuse LAD stenosis with multiple lesions in the proximal, mid, and apical portions.  The LAD wraps around the apex and supplies much of the inferior wall. 3.  Severe mid circumflex and first obtuse marginal stenoses 4.  Moderate to severe proximal and mid RCA stenoses 5.  Vigorous LV systolic function with LVEF greater than 65% and no regional wall motion abnormalities, moderately elevated LVEDP   Reviewed findings with patient.  Recommend outpatient cardiac surgical evaluation for consideration of CABG.  Difficult anatomy with diffuse disease including distal vessel disease.  RCA supplies small territory of myocardium due to large wraparound LAD.  I think her treatment options are likely limited to CABG versus aggressive medical therapy as she has very diffuse disease involvement for PCI.  Past Medical History:  Diagnosis Date   Anxiety    Arthritis    Cancer (HCC)    basal and squamous cell carcinoma. Lesion excisions in 2023.   Complication of anesthesia    Limited mouth opening and neck mobility, s/p TMJ surgeries    Fibromyalgia    Hypertension    MVP (mitral valve prolapse)    Pneumonia 2023   Hospitalized for 2 nights, [facility].   PONV (postoperative nausea and vomiting)     Past Surgical History:  Procedure Laterality Date   ABDOMINAL HYSTERECTOMY     APPENDECTOMY     CATARACT EXTRACTION Bilateral    CERVICAL SPINE SURGERY     DILATION AND CURETTAGE OF UTERUS     x 2   FIRST RIB REMOVAL Right    HAND SURGERY Bilateral    KNEE ARTHROSCOPY W/ MENISCAL REPAIR Right    KNEE ARTHROSCOPY W/ MENISCAL REPAIR Left    LEFT HEART CATH AND CORONARY ANGIOGRAPHY N/A 01/15/2024   Procedure: LEFT HEART CATH AND CORONARY ANGIOGRAPHY;  Surgeon: Wonda Sharper, MD;  Location: [facility] INVASIVE CV LAB;  Service: Cardiovascular;  Laterality: N/A;   NEUROMA SURGERY Left    POSTERIOR CERVICAL FUSION/FORAMINOTOMY N/A 02/16/2017   Procedure: CERVICAL 1- CERVICAL 2 POSTERIOR INSTRUMENTATION AND FUSION;  Surgeon: Mavis Purchase, MD;  Location: [facility] OR;  Service: Neurosurgery;  Laterality: N/A;  CERVICAL 1- CERVICAL 2 POSTERIOR INSTRUMENTATION AND FUSION   SQUAMOUS CELL CARCINOMA EXCISION  2023   2 on right leg and 1 on nose.   TMJ ARTHROPLASTY      MEDICATIONS:  ALPRAZolam  (XANAX ) 0.5 MG tablet   aspirin  EC 81 MG tablet   escitalopram  (LEXAPRO ) 10 MG tablet  hydrochlorothiazide (HYDRODIURIL) 25 MG tablet   isosorbide  mononitrate (IMDUR ) 30 MG 24 hr tablet   metoprolol  succinate (TOPROL -XL) 25 MG 24 hr tablet   nitroGLYCERIN  (NITROSTAT ) 0.4 MG SL tablet   rosuvastatin  (CRESTOR ) 40 MG tablet   sulfamethoxazole -trimethoprim  (BACTRIM  DS) 800-160 MG tablet   telmisartan  (MICARDIS ) 80 MG tablet   traMADol  (ULTRAM ) 50 MG tablet   No current facility-administered medications for this encounter.    [START ON 02/01/2024] ceFAZolin  (ANCEF ) IVPB 2g/100 mL premix   [START ON 02/01/2024] ceFAZolin  (ANCEF ) IVPB 2g/100 mL premix   [START ON 02/01/2024] dexmedetomidine  (PRECEDEX ) 400 MCG/100ML (4 mcg/mL) infusion   [START ON 02/01/2024] EPINEPHrine  (ADRENALIN ) 5 mg in NS 250 mL (0.02 mg/mL) premix infusion   [START ON 02/01/2024] heparin   30,000 units/NS 1000 mL solution for CELLSAVER   [START ON 02/01/2024] heparin  sodium (porcine) 2,500 Units, papaverine  30 mg in electrolyte-A (PLASMALYTE-A PH 7.4) 500 mL irrigation   [START ON 02/01/2024] insulin  regular, human (MYXREDLIN ) 100 units/ 100 mL infusion   [START ON 02/01/2024] [facility] Blood Cardioplegia vial (lidocaine /magnesium /mannitol  0.26g-4g-6.4g)   [START ON 02/01/2024] milrinone  (PRIMACOR ) 20 MG/100 ML (0.2 mg/mL) infusion   [START ON 02/01/2024] nitroGLYCERIN  50 mg in dextrose  5 % 250 mL (0.2 mg/mL) infusion   [START ON 02/01/2024] norepinephrine  (LEVOPHED ) 4mg  in (0.016 mg/mL) premix infusion   [START ON 02/01/2024] phenylephrine  (NEO-SYNEPHRINE) 20mg /NS 250mL premix infusion   [START ON 02/01/2024] potassium chloride  injection 80 mEq   [START ON 02/01/2024] tranexamic acid  (CYKLOKAPRON ) 2,500 mg in sodium chloride  0.9 % 250 mL (10 mg/mL) infusion   [START ON 02/01/2024] tranexamic acid  (CYKLOKAPRON ) bolus via infusion - over 30 minutes 1,152 mg   [START ON 02/01/2024] tranexamic acid  (CYKLOKAPRON ) pump prime solution 154 mg   [START ON 02/01/2024] vancomycin  (VANCOREADY) IVPB 1250 mg/250 mL    Elonna Mcfarlane, PA-C Surgical Short Stay/Anesthesiology [facility] Phone [phone] [facility] Phone [phone] 01/31/2024 3:13 PM

## 2024-01-31 NOTE — Progress Notes (Signed)
 PCP - Lonni Rusty COME Cardiologist - Ozell Cooper,MD  PPM/ICD - denies Device Orders -  Rep Notified -   Chest x-ray - 01/31/24 EKG - 01/04/24 Stress Test - 20 years ago ECHO - 01/18/24 Cardiac Cath - 01/15/24  Sleep Study - denies CPAP -   Fasting Blood Sugar - na Checks Blood Sugar _____ times a day  Last dose of GLP1 agonist-  na GLP1 instructions:   Blood Thinner Instructions:na Aspirin  Instructions:hold DOS  ERAS Protcol -no PRE-SURGERY Ensure or G2-   COVID TEST- na   Anesthesia review:   Patient denies shortness of breath, fever, cough and chest pain at PAT appointment   All instructions explained to the patient, with a verbal understanding of the material. Patient agrees to go over the instructions while at home for a better understanding.  The opportunity to ask questions was provided.

## 2024-01-31 NOTE — Anesthesia Preprocedure Evaluation (Signed)
 Anesthesia Evaluation  Patient identified by MRN, date of birth, ID band Patient awake    Reviewed: Allergy & Precautions, NPO status , Patient's Chart, lab work & pertinent test results  History of Anesthesia Complications (+) PONV, DIFFICULT AIRWAY and history of anesthetic complications  Airway Mallampati: IV  TM Distance: <3 FB Neck ROM: Limited  Mouth opening: Limited Mouth Opening  Dental  (+) Teeth Intact, Dental Advisory Given   Pulmonary    breath sounds clear to auscultation       Cardiovascular hypertension, Pt. on medications and Pt. on home beta blockers + CAD  + Valvular Problems/Murmurs MVP  Rhythm:Regular Rate:Normal  Echo:  1. Left ventricular ejection fraction, by estimation, is 70 to 75%. The  left ventricle has hyperdynamic function. The left ventricle has no  regional wall motion abnormalities. Left ventricular diastolic parameters  were normal. The average left  ventricular global longitudinal strain is -27.2 %. The global longitudinal  strain is normal.   2. Right ventricular systolic function is normal. The right ventricular  size is normal.   3. The mitral valve is normal in structure. Trivial mitral valve  regurgitation. No evidence of mitral stenosis.   4. The aortic valve is tricuspid. There is mild calcification of the  aortic valve. Aortic valve regurgitation is not visualized. Aortic valve  sclerosis/calcification is present, without any evidence of aortic  stenosis.   5. The inferior vena cava is normal in size with greater than 50%  respiratory variability, suggesting right atrial pressure of 3 mmHg.     Neuro/Psych   Anxiety      Neuromuscular disease    GI/Hepatic negative GI ROS, Neg liver ROS,,,  Endo/Other  negative endocrine ROS    Renal/GU negative Renal ROS     Musculoskeletal  (+) Arthritis ,  Fibromyalgia -  Abdominal   Peds  Hematology negative hematology ROS (+)    Anesthesia Other Findings   Reproductive/Obstetrics                              Anesthesia Physical Anesthesia Plan  ASA: 4  Anesthesia Plan: General   Post-op Pain Management:    Induction: Intravenous  PONV Risk Score and Plan: 4 or greater and Ondansetron , Midazolam , Dexamethasone  and Treatment may vary due to age or medical condition  Airway Management Planned: Oral ETT and Video Laryngoscope Planned  Additional Equipment: Arterial line, CVP, TEE and Ultrasound Guidance Line Placement  Intra-op Plan:   Post-operative Plan: Post-operative intubation/ventilation  Informed Consent: I have reviewed the patients History and Physical, chart, labs and discussed the procedure including the risks, benefits and alternatives for the proposed anesthesia with the patient or authorized representative who has indicated his/her understanding and acceptance.     Dental advisory given  Plan Discussed with: CRNA  Anesthesia Plan Comments: (PAT note written 01/31/2024 by Nereida Schepp, PA-C.  )         Anesthesia Quick Evaluation

## 2024-02-01 ENCOUNTER — Inpatient Hospital Stay (HOSPITAL_COMMUNITY)
Admission: RE | Admit: 2024-02-01 | Discharge: 2024-02-09 | DRG: 235 | Disposition: A | Discharge status: Home Health | Attending: Thoracic Surgery (Cardiothoracic Vascular Surgery) | Admitting: Thoracic Surgery (Cardiothoracic Vascular Surgery)

## 2024-02-01 ENCOUNTER — Inpatient Hospital Stay (HOSPITAL_COMMUNITY)

## 2024-02-01 ENCOUNTER — Other Ambulatory Visit: Payer: Self-pay

## 2024-02-01 ENCOUNTER — Encounter (HOSPITAL_COMMUNITY): Payer: Self-pay | Admitting: Anesthesiology

## 2024-02-01 ENCOUNTER — Encounter (HOSPITAL_COMMUNITY): Payer: Self-pay | Admitting: Thoracic Surgery (Cardiothoracic Vascular Surgery)

## 2024-02-01 ENCOUNTER — Inpatient Hospital Stay (HOSPITAL_COMMUNITY)
Admission: RE | Disposition: A | Discharge status: Home / Self Care | Payer: Self-pay | Source: Home / Self Care | Attending: Thoracic Surgery (Cardiothoracic Vascular Surgery)

## 2024-02-01 DIAGNOSIS — I2510 Atherosclerotic heart disease of native coronary artery without angina pectoris: Secondary | ICD-10-CM | POA: Diagnosis present

## 2024-02-01 DIAGNOSIS — R0789 Other chest pain: Secondary | ICD-10-CM | POA: Diagnosis present

## 2024-02-01 DIAGNOSIS — D62 Acute posthemorrhagic anemia: Secondary | ICD-10-CM | POA: Diagnosis not present

## 2024-02-01 DIAGNOSIS — G9340 Encephalopathy, unspecified: Secondary | ICD-10-CM | POA: Diagnosis present

## 2024-02-01 DIAGNOSIS — Z7982 Long term (current) use of aspirin: Secondary | ICD-10-CM | POA: Diagnosis not present

## 2024-02-01 DIAGNOSIS — I214 Non-ST elevation (NSTEMI) myocardial infarction: Principal | ICD-10-CM | POA: Diagnosis present

## 2024-02-01 DIAGNOSIS — I081 Rheumatic disorders of both mitral and tricuspid valves: Secondary | ICD-10-CM | POA: Diagnosis not present

## 2024-02-01 DIAGNOSIS — J90 Pleural effusion, not elsewhere classified: Secondary | ICD-10-CM | POA: Diagnosis present

## 2024-02-01 DIAGNOSIS — M797 Fibromyalgia: Secondary | ICD-10-CM | POA: Diagnosis not present

## 2024-02-01 DIAGNOSIS — E785 Hyperlipidemia, unspecified: Secondary | ICD-10-CM | POA: Diagnosis not present

## 2024-02-01 DIAGNOSIS — F419 Anxiety disorder, unspecified: Secondary | ICD-10-CM

## 2024-02-01 DIAGNOSIS — R008 Other abnormalities of heart beat: Secondary | ICD-10-CM | POA: Diagnosis not present

## 2024-02-01 DIAGNOSIS — Z79899 Other long term (current) drug therapy: Secondary | ICD-10-CM

## 2024-02-01 DIAGNOSIS — Z9911 Dependence on respirator [ventilator] status: Secondary | ICD-10-CM | POA: Diagnosis not present

## 2024-02-01 DIAGNOSIS — Z452 Encounter for adjustment and management of vascular access device: Secondary | ICD-10-CM | POA: Diagnosis not present

## 2024-02-01 DIAGNOSIS — I10 Essential (primary) hypertension: Secondary | ICD-10-CM | POA: Diagnosis not present

## 2024-02-01 DIAGNOSIS — R9431 Abnormal electrocardiogram [ECG] [EKG]: Secondary | ICD-10-CM | POA: Diagnosis not present

## 2024-02-01 DIAGNOSIS — D65 Disseminated intravascular coagulation [defibrination syndrome]: Secondary | ICD-10-CM | POA: Diagnosis not present

## 2024-02-01 DIAGNOSIS — J9811 Atelectasis: Secondary | ICD-10-CM | POA: Diagnosis not present

## 2024-02-01 DIAGNOSIS — I471 Supraventricular tachycardia, unspecified: Secondary | ICD-10-CM | POA: Diagnosis present

## 2024-02-01 DIAGNOSIS — E1165 Type 2 diabetes mellitus with hyperglycemia: Secondary | ICD-10-CM | POA: Diagnosis not present

## 2024-02-01 DIAGNOSIS — R5381 Other malaise: Secondary | ICD-10-CM | POA: Diagnosis present

## 2024-02-01 DIAGNOSIS — Z90710 Acquired absence of both cervix and uterus: Secondary | ICD-10-CM | POA: Diagnosis not present

## 2024-02-01 DIAGNOSIS — R0602 Shortness of breath: Secondary | ICD-10-CM | POA: Diagnosis not present

## 2024-02-01 DIAGNOSIS — I517 Cardiomegaly: Secondary | ICD-10-CM | POA: Diagnosis not present

## 2024-02-01 DIAGNOSIS — Z951 Presence of aortocoronary bypass graft: Secondary | ICD-10-CM | POA: Diagnosis not present

## 2024-02-01 DIAGNOSIS — Z85828 Personal history of other malignant neoplasm of skin: Secondary | ICD-10-CM

## 2024-02-01 DIAGNOSIS — Z48812 Encounter for surgical aftercare following surgery on the circulatory system: Secondary | ICD-10-CM | POA: Diagnosis not present

## 2024-02-01 DIAGNOSIS — Z4682 Encounter for fitting and adjustment of non-vascular catheter: Secondary | ICD-10-CM | POA: Diagnosis not present

## 2024-02-01 DIAGNOSIS — I4891 Unspecified atrial fibrillation: Secondary | ICD-10-CM | POA: Diagnosis present

## 2024-02-01 DIAGNOSIS — J939 Pneumothorax, unspecified: Secondary | ICD-10-CM | POA: Diagnosis not present

## 2024-02-01 DIAGNOSIS — I25119 Atherosclerotic heart disease of native coronary artery with unspecified angina pectoris: Secondary | ICD-10-CM

## 2024-02-01 DIAGNOSIS — J984 Other disorders of lung: Secondary | ICD-10-CM | POA: Diagnosis not present

## 2024-02-01 DIAGNOSIS — R918 Other nonspecific abnormal finding of lung field: Secondary | ICD-10-CM | POA: Diagnosis not present

## 2024-02-01 HISTORY — DX: Failed or difficult intubation, initial encounter: T88.4XXA

## 2024-02-01 LAB — CBC WITH DIFFERENTIAL/PLATELET
Abs Immature Granulocytes: 0.03 K/uL (ref 0.00–0.07)
Abs Immature Granulocytes: 0.03 K/uL (ref 0.00–0.07)
Basophils Absolute: 0 K/uL (ref 0.0–0.1)
Basophils Absolute: 0 K/uL (ref 0.0–0.1)
Basophils Relative: 0 %
Basophils Relative: 0 %
Eosinophils Absolute: 0 K/uL (ref 0.0–0.5)
Eosinophils Absolute: 0 K/uL (ref 0.0–0.5)
Eosinophils Relative: 0 %
Eosinophils Relative: 0 %
HCT: 25.8 % — ABNORMAL LOW (ref 36.0–46.0)
HCT: 26 % — ABNORMAL LOW (ref 36.0–46.0)
Hemoglobin: 8.4 g/dL — ABNORMAL LOW (ref 12.0–15.0)
Hemoglobin: 8.6 g/dL — ABNORMAL LOW (ref 12.0–15.0)
Immature Granulocytes: 0 %
Immature Granulocytes: 0 %
Lymphocytes Relative: 9 %
Lymphocytes Relative: 6 %
Lymphs Abs: 0.8 K/uL (ref 0.7–4.0)
Lymphs Abs: 0.6 K/uL — ABNORMAL LOW (ref 0.7–4.0)
MCH: 29.9 pg (ref 26.0–34.0)
MCH: 30.6 pg (ref 26.0–34.0)
MCHC: 32.6 g/dL (ref 30.0–36.0)
MCHC: 33.1 g/dL (ref 30.0–36.0)
MCV: 91.8 fL (ref 80.0–100.0)
MCV: 92.5 fL (ref 80.0–100.0)
Monocytes Absolute: 0.7 K/uL (ref 0.1–1.0)
Monocytes Absolute: 0.7 K/uL (ref 0.1–1.0)
Monocytes Relative: 8 %
Monocytes Relative: 7 %
Neutro Abs: 7 K/uL (ref 1.7–7.7)
Neutro Abs: 8.4 K/uL — ABNORMAL HIGH (ref 1.7–7.7)
Neutrophils Relative %: 83 %
Neutrophils Relative %: 87 %
Platelets: 101 K/uL — ABNORMAL LOW (ref 150–400)
Platelets: 119 K/uL — ABNORMAL LOW (ref 150–400)
RBC: 2.81 MIL/uL — ABNORMAL LOW (ref 3.87–5.11)
RBC: 2.81 MIL/uL — ABNORMAL LOW (ref 3.87–5.11)
RDW: 13.5 % (ref 11.5–15.5)
RDW: 13.7 % (ref 11.5–15.5)
WBC: 8.5 K/uL (ref 4.0–10.5)
WBC: 9.7 K/uL (ref 4.0–10.5)
nRBC: 0 % (ref 0.0–0.2)
nRBC: 0 % (ref 0.0–0.2)

## 2024-02-01 LAB — POCT I-STAT 7, (LYTES, BLD GAS, ICA,H+H)
Acid-Base Excess: 0 mmol/L (ref 0.0–2.0)
Acid-Base Excess: 0 mmol/L (ref 0.0–2.0)
Acid-base deficit: 1 mmol/L (ref 0.0–2.0)
Acid-base deficit: 1 mmol/L (ref 0.0–2.0)
Acid-base deficit: 1 mmol/L (ref 0.0–2.0)
Acid-base deficit: 2 mmol/L (ref 0.0–2.0)
Acid-base deficit: 2 mmol/L (ref 0.0–2.0)
Acid-base deficit: 5 mmol/L — ABNORMAL HIGH (ref 0.0–2.0)
Acid-base deficit: 6 mmol/L — ABNORMAL HIGH (ref 0.0–2.0)
Bicarbonate: 19.8 mmol/L — ABNORMAL LOW (ref 20.0–28.0)
Bicarbonate: 20.5 mmol/L (ref 20.0–28.0)
Bicarbonate: 22.6 mmol/L (ref 20.0–28.0)
Bicarbonate: 24 mmol/L (ref 20.0–28.0)
Bicarbonate: 24.3 mmol/L (ref 20.0–28.0)
Bicarbonate: 24.7 mmol/L (ref 20.0–28.0)
Bicarbonate: 25.1 mmol/L (ref 20.0–28.0)
Bicarbonate: 25.4 mmol/L (ref 20.0–28.0)
Bicarbonate: 26.5 mmol/L (ref 20.0–28.0)
Calcium, Ion: 1.01 mmol/L — ABNORMAL LOW (ref 1.15–1.40)
Calcium, Ion: 1.01 mmol/L — ABNORMAL LOW (ref 1.15–1.40)
Calcium, Ion: 1.03 mmol/L — ABNORMAL LOW (ref 1.15–1.40)
Calcium, Ion: 1.04 mmol/L — ABNORMAL LOW (ref 1.15–1.40)
Calcium, Ion: 1.06 mmol/L — ABNORMAL LOW (ref 1.15–1.40)
Calcium, Ion: 1.17 mmol/L (ref 1.15–1.40)
Calcium, Ion: 1.24 mmol/L (ref 1.15–1.40)
Calcium, Ion: 1.34 mmol/L (ref 1.15–1.40)
Calcium, Ion: 1.74 mmol/L (ref 1.15–1.40)
HCT: 19 % — ABNORMAL LOW (ref 36.0–46.0)
HCT: 20 % — ABNORMAL LOW (ref 36.0–46.0)
HCT: 20 % — ABNORMAL LOW (ref 36.0–46.0)
HCT: 21 % — ABNORMAL LOW (ref 36.0–46.0)
HCT: 22 % — ABNORMAL LOW (ref 36.0–46.0)
HCT: 22 % — ABNORMAL LOW (ref 36.0–46.0)
HCT: 23 % — ABNORMAL LOW (ref 36.0–46.0)
HCT: 25 % — ABNORMAL LOW (ref 36.0–46.0)
HCT: 38 % (ref 36.0–46.0)
Hemoglobin: 12.9 g/dL (ref 12.0–15.0)
Hemoglobin: 6.5 g/dL — CL (ref 12.0–15.0)
Hemoglobin: 6.8 g/dL — CL (ref 12.0–15.0)
Hemoglobin: 6.8 g/dL — CL (ref 12.0–15.0)
Hemoglobin: 7.1 g/dL — ABNORMAL LOW (ref 12.0–15.0)
Hemoglobin: 7.5 g/dL — ABNORMAL LOW (ref 12.0–15.0)
Hemoglobin: 7.5 g/dL — ABNORMAL LOW (ref 12.0–15.0)
Hemoglobin: 7.8 g/dL — ABNORMAL LOW (ref 12.0–15.0)
Hemoglobin: 8.5 g/dL — ABNORMAL LOW (ref 12.0–15.0)
O2 Saturation: 100 %
O2 Saturation: 100 %
O2 Saturation: 100 %
O2 Saturation: 100 %
O2 Saturation: 100 %
O2 Saturation: 100 %
O2 Saturation: 100 %
O2 Saturation: 99 %
O2 Saturation: 99 %
Patient temperature: 36.2
Patient temperature: 37.5
Potassium: 3.6 mmol/L (ref 3.5–5.1)
Potassium: 3.8 mmol/L (ref 3.5–5.1)
Potassium: 3.9 mmol/L (ref 3.5–5.1)
Potassium: 3.9 mmol/L (ref 3.5–5.1)
Potassium: 4.3 mmol/L (ref 3.5–5.1)
Potassium: 4.9 mmol/L (ref 3.5–5.1)
Potassium: 4.9 mmol/L (ref 3.5–5.1)
Potassium: 5.3 mmol/L — ABNORMAL HIGH (ref 3.5–5.1)
Potassium: 5.5 mmol/L — ABNORMAL HIGH (ref 3.5–5.1)
Sodium: 133 mmol/L — ABNORMAL LOW (ref 135–145)
Sodium: 134 mmol/L — ABNORMAL LOW (ref 135–145)
Sodium: 134 mmol/L — ABNORMAL LOW (ref 135–145)
Sodium: 134 mmol/L — ABNORMAL LOW (ref 135–145)
Sodium: 135 mmol/L (ref 135–145)
Sodium: 137 mmol/L (ref 135–145)
Sodium: 137 mmol/L (ref 135–145)
Sodium: 141 mmol/L (ref 135–145)
Sodium: 143 mmol/L (ref 135–145)
TCO2: 21 mmol/L — ABNORMAL LOW (ref 22–32)
TCO2: 22 mmol/L (ref 22–32)
TCO2: 24 mmol/L (ref 22–32)
TCO2: 25 mmol/L (ref 22–32)
TCO2: 25 mmol/L (ref 22–32)
TCO2: 26 mmol/L (ref 22–32)
TCO2: 27 mmol/L (ref 22–32)
TCO2: 27 mmol/L (ref 22–32)
TCO2: 28 mmol/L (ref 22–32)
pCO2 arterial: 32.4 mmHg (ref 32–48)
pCO2 arterial: 35.2 mmHg (ref 32–48)
pCO2 arterial: 39.2 mmHg (ref 32–48)
pCO2 arterial: 40.5 mmHg (ref 32–48)
pCO2 arterial: 41.3 mmHg (ref 32–48)
pCO2 arterial: 44.7 mmHg (ref 32–48)
pCO2 arterial: 49.9 mmHg — ABNORMAL HIGH (ref 32–48)
pCO2 arterial: 54.8 mmHg — ABNORMAL HIGH (ref 32–48)
pCO2 arterial: 62.1 mmHg — ABNORMAL HIGH (ref 32–48)
pH, Arterial: 7.238 — ABNORMAL LOW (ref 7.35–7.45)
pH, Arterial: 7.269 — ABNORMAL LOW (ref 7.35–7.45)
pH, Arterial: 7.271 — ABNORMAL LOW (ref 7.35–7.45)
pH, Arterial: 7.314 — ABNORMAL LOW (ref 7.35–7.45)
pH, Arterial: 7.381 (ref 7.35–7.45)
pH, Arterial: 7.385 (ref 7.35–7.45)
pH, Arterial: 7.39 (ref 7.35–7.45)
pH, Arterial: 7.4 (ref 7.35–7.45)
pH, Arterial: 7.415 (ref 7.35–7.45)
pO2, Arterial: 137 mmHg — ABNORMAL HIGH (ref 83–108)
pO2, Arterial: 187 mmHg — ABNORMAL HIGH (ref 83–108)
pO2, Arterial: 260 mmHg — ABNORMAL HIGH (ref 83–108)
pO2, Arterial: 295 mmHg — ABNORMAL HIGH (ref 83–108)
pO2, Arterial: 343 mmHg — ABNORMAL HIGH (ref 83–108)
pO2, Arterial: 370 mmHg — ABNORMAL HIGH (ref 83–108)
pO2, Arterial: 402 mmHg — ABNORMAL HIGH (ref 83–108)
pO2, Arterial: 412 mmHg — ABNORMAL HIGH (ref 83–108)
pO2, Arterial: 504 mmHg — ABNORMAL HIGH (ref 83–108)

## 2024-02-01 LAB — POCT I-STAT, CHEM 8
BUN: 13 mg/dL (ref 8–23)
BUN: 12 mg/dL (ref 8–23)
BUN: 10 mg/dL (ref 8–23)
BUN: 10 mg/dL (ref 8–23)
BUN: 9 mg/dL (ref 8–23)
BUN: 9 mg/dL (ref 8–23)
Calcium, Ion: 1.24 mmol/L (ref 1.15–1.40)
Calcium, Ion: 1.2 mmol/L (ref 1.15–1.40)
Calcium, Ion: 1.05 mmol/L — ABNORMAL LOW (ref 1.15–1.40)
Calcium, Ion: 0.98 mmol/L — ABNORMAL LOW (ref 1.15–1.40)
Calcium, Ion: 1.01 mmol/L — ABNORMAL LOW (ref 1.15–1.40)
Calcium, Ion: 1.75 mmol/L (ref 1.15–1.40)
Chloride: 99 mmol/L (ref 98–111)
Chloride: 102 mmol/L (ref 98–111)
Chloride: 98 mmol/L (ref 98–111)
Chloride: 101 mmol/L (ref 98–111)
Chloride: 99 mmol/L (ref 98–111)
Chloride: 105 mmol/L (ref 98–111)
Creatinine, Ser: 0.7 mg/dL (ref 0.44–1.00)
Creatinine, Ser: 0.6 mg/dL (ref 0.44–1.00)
Creatinine, Ser: 0.6 mg/dL (ref 0.44–1.00)
Creatinine, Ser: 0.5 mg/dL (ref 0.44–1.00)
Creatinine, Ser: 0.5 mg/dL (ref 0.44–1.00)
Creatinine, Ser: 0.5 mg/dL (ref 0.44–1.00)
Glucose, Bld: 103 mg/dL — ABNORMAL HIGH (ref 70–99)
Glucose, Bld: 123 mg/dL — ABNORMAL HIGH (ref 70–99)
Glucose, Bld: 128 mg/dL — ABNORMAL HIGH (ref 70–99)
Glucose, Bld: 127 mg/dL — ABNORMAL HIGH (ref 70–99)
Glucose, Bld: 134 mg/dL — ABNORMAL HIGH (ref 70–99)
Glucose, Bld: 153 mg/dL — ABNORMAL HIGH (ref 70–99)
HCT: 38 % (ref 36.0–46.0)
HCT: 33 % — ABNORMAL LOW (ref 36.0–46.0)
HCT: 22 % — ABNORMAL LOW (ref 36.0–46.0)
HCT: 20 % — ABNORMAL LOW (ref 36.0–46.0)
HCT: 21 % — ABNORMAL LOW (ref 36.0–46.0)
HCT: 20 % — ABNORMAL LOW (ref 36.0–46.0)
Hemoglobin: 12.9 g/dL (ref 12.0–15.0)
Hemoglobin: 11.2 g/dL — ABNORMAL LOW (ref 12.0–15.0)
Hemoglobin: 7.5 g/dL — ABNORMAL LOW (ref 12.0–15.0)
Hemoglobin: 6.8 g/dL — CL (ref 12.0–15.0)
Hemoglobin: 7.1 g/dL — ABNORMAL LOW (ref 12.0–15.0)
Hemoglobin: 6.8 g/dL — CL (ref 12.0–15.0)
Potassium: 4 mmol/L (ref 3.5–5.1)
Potassium: 4.1 mmol/L (ref 3.5–5.1)
Potassium: 5 mmol/L (ref 3.5–5.1)
Potassium: 4.9 mmol/L (ref 3.5–5.1)
Potassium: 5.1 mmol/L (ref 3.5–5.1)
Potassium: 4.4 mmol/L (ref 3.5–5.1)
Sodium: 137 mmol/L (ref 135–145)
Sodium: 136 mmol/L (ref 135–145)
Sodium: 133 mmol/L — ABNORMAL LOW (ref 135–145)
Sodium: 133 mmol/L — ABNORMAL LOW (ref 135–145)
Sodium: 136 mmol/L (ref 135–145)
Sodium: 136 mmol/L (ref 135–145)
TCO2: 28 mmol/L (ref 22–32)
TCO2: 23 mmol/L (ref 22–32)
TCO2: 28 mmol/L (ref 22–32)
TCO2: 25 mmol/L (ref 22–32)
TCO2: 25 mmol/L (ref 22–32)
TCO2: 25 mmol/L (ref 22–32)

## 2024-02-01 LAB — BASIC METABOLIC PANEL WITH GFR
Anion gap: 8 (ref 5–15)
Anion gap: 8 (ref 5–15)
BUN: 8 mg/dL (ref 8–23)
BUN: 10 mg/dL (ref 8–23)
CO2: 21 mmol/L — ABNORMAL LOW (ref 22–32)
CO2: 22 mmol/L (ref 22–32)
Calcium: 8.8 mg/dL — ABNORMAL LOW (ref 8.9–10.3)
Calcium: 8.5 mg/dL — ABNORMAL LOW (ref 8.9–10.3)
Chloride: 109 mmol/L (ref 98–111)
Chloride: 107 mmol/L (ref 98–111)
Creatinine, Ser: 0.77 mg/dL (ref 0.44–1.00)
Creatinine, Ser: 0.76 mg/dL (ref 0.44–1.00)
GFR, Estimated: >60 mL/min (ref >60)
GFR, Estimated: >60 mL/min (ref >60)
Glucose, Bld: 136 mg/dL — ABNORMAL HIGH (ref 70–99)
Glucose, Bld: 131 mg/dL — ABNORMAL HIGH (ref 70–99)
Potassium: 4.3 mmol/L (ref 3.5–5.1)
Potassium: 4.1 mmol/L (ref 3.5–5.1)
Sodium: 138 mmol/L (ref 135–145)
Sodium: 137 mmol/L (ref 135–145)

## 2024-02-01 LAB — CBC
HCT: 23 % — ABNORMAL LOW (ref 36.0–46.0)
Hemoglobin: 7.5 g/dL — ABNORMAL LOW (ref 12.0–15.0)
MCH: 30.6 pg (ref 26.0–34.0)
MCHC: 32.6 g/dL (ref 30.0–36.0)
MCV: 93.9 fL (ref 80.0–100.0)
Platelets: 100 K/uL — ABNORMAL LOW (ref 150–400)
RBC: 2.45 MIL/uL — ABNORMAL LOW (ref 3.87–5.11)
RDW: 12.7 % (ref 11.5–15.5)
WBC: 11.1 K/uL — ABNORMAL HIGH (ref 4.0–10.5)
nRBC: 0 % (ref 0.0–0.2)

## 2024-02-01 LAB — PROTIME-INR
INR: 1.6 — ABNORMAL HIGH (ref 0.8–1.2)
INR: 1.4 — ABNORMAL HIGH (ref 0.8–1.2)
Prothrombin Time: 19.9 s — ABNORMAL HIGH (ref 11.4–15.2)
Prothrombin Time: 17.8 s — ABNORMAL HIGH (ref 11.4–15.2)

## 2024-02-01 LAB — GLUCOSE, CAPILLARY
Glucose-Capillary: 111 mg/dL — ABNORMAL HIGH (ref 70–99)
Glucose-Capillary: 120 mg/dL — ABNORMAL HIGH (ref 70–99)
Glucose-Capillary: 132 mg/dL — ABNORMAL HIGH (ref 70–99)
Glucose-Capillary: 151 mg/dL — ABNORMAL HIGH (ref 70–99)
Glucose-Capillary: 151 mg/dL — ABNORMAL HIGH (ref 70–99)
Glucose-Capillary: 157 mg/dL — ABNORMAL HIGH (ref 70–99)
Glucose-Capillary: 173 mg/dL — ABNORMAL HIGH (ref 70–99)

## 2024-02-01 LAB — POCT I-STAT EG7
Acid-base deficit: 1 mmol/L (ref 0.0–2.0)
Bicarbonate: 24.2 mmol/L (ref 20.0–28.0)
Calcium, Ion: 0.99 mmol/L — ABNORMAL LOW (ref 1.15–1.40)
HCT: 22 % — ABNORMAL LOW (ref 36.0–46.0)
Hemoglobin: 7.5 g/dL — ABNORMAL LOW (ref 12.0–15.0)
O2 Saturation: 85 %
Potassium: 5.9 mmol/L — ABNORMAL HIGH (ref 3.5–5.1)
Sodium: 133 mmol/L — ABNORMAL LOW (ref 135–145)
TCO2: 25 mmol/L (ref 22–32)
pCO2, Ven: 41.2 mmHg — ABNORMAL LOW (ref 44–60)
pH, Ven: 7.377 (ref 7.25–7.43)
pO2, Ven: 51 mmHg — ABNORMAL HIGH (ref 32–45)

## 2024-02-01 LAB — ECHO INTRAOPERATIVE TEE
Height: 66 in
Weight: 2704 [oz_av]

## 2024-02-01 LAB — FIBRINOGEN: Fibrinogen: 217 mg/dL (ref 210–475)

## 2024-02-01 LAB — PLATELET COUNT
Platelets: 156 K/uL (ref 150–400)
Platelets: 133 K/uL — ABNORMAL LOW (ref 150–400)

## 2024-02-01 LAB — HEMOGLOBIN A1C
Hgb A1c MFr Bld: 5.6 % (ref 4.8–5.6)
Mean Plasma Glucose: 114 mg/dL

## 2024-02-01 LAB — HEMOGLOBIN AND HEMATOCRIT, BLOOD
HCT: 23.8 % — ABNORMAL LOW (ref 36.0–46.0)
HCT: 20.6 % — ABNORMAL LOW (ref 36.0–46.0)
Hemoglobin: 7.7 g/dL — ABNORMAL LOW (ref 12.0–15.0)
Hemoglobin: 6.8 g/dL — CL (ref 12.0–15.0)

## 2024-02-01 LAB — APTT: aPTT: 34 s (ref 24–36)

## 2024-02-01 LAB — PREPARE RBC (CROSSMATCH)

## 2024-02-01 SURGERY — CORONARY ARTERY BYPASS GRAFTING (CABG)
Anesthesia: General | Site: Chest

## 2024-02-01 MED ORDER — SODIUM CHLORIDE 0.9% IV SOLUTION: Freq: Once | INTRAVENOUS | Status: AC

## 2024-02-01 MED ORDER — SODIUM CHLORIDE 0.9% FLUSH
3.0000 mL | Freq: Two times a day (BID) | INTRAVENOUS | Status: DC
  Administered 2024-02-02 – 2024-02-03 (×4): 3 mL via INTRAVENOUS

## 2024-02-01 MED ORDER — ROSUVASTATIN CALCIUM 20 MG PO TABS
40.0000 mg | ORAL_TABLET | Freq: Every day | ORAL | Status: DC
  Administered 2024-02-02 – 2024-02-09 (×8): 40 mg via ORAL
  Filled 2024-02-01 (×9): qty 2

## 2024-02-01 MED ORDER — FENTANYL CITRATE (PF) 250 MCG/5ML IJ SOLN
INTRAMUSCULAR | Status: AC
  Filled 2024-02-01: qty 5

## 2024-02-01 MED ORDER — ALBUMIN HUMAN 5 % IV SOLN: INTRAVENOUS | Status: DC | PRN

## 2024-02-01 MED ORDER — MIDAZOLAM HCL (PF) 5 MG/ML IJ SOLN
INTRAMUSCULAR | Status: DC | PRN
  Administered 2024-02-01 (×2): 1 mg via INTRAVENOUS

## 2024-02-01 MED ORDER — OXYCODONE HCL 5 MG PO TABS
5.0000 mg | ORAL_TABLET | ORAL | Status: DC | PRN
  Administered 2024-02-01: 5 mg via ORAL
  Administered 2024-02-02 – 2024-02-03 (×5): 10 mg via ORAL
  Filled 2024-02-01: qty 1
  Filled 2024-02-01 (×5): qty 2

## 2024-02-01 MED ORDER — SODIUM CHLORIDE 0.9% FLUSH: 10.0000 mL | INTRAVENOUS | Status: DC | PRN

## 2024-02-01 MED ORDER — HEMOSTATIC AGENTS (NO CHARGE) OPTIME
TOPICAL | Status: DC | PRN
  Administered 2024-02-01: 1 via TOPICAL

## 2024-02-01 MED ORDER — ASPIRIN 81 MG PO CHEW
324.0000 mg | CHEWABLE_TABLET | Freq: Once | ORAL | Status: AC
  Administered 2024-02-01: 324 mg via ORAL
  Filled 2024-02-01: qty 4

## 2024-02-01 MED ORDER — PANTOPRAZOLE SODIUM 40 MG IV SOLR
40.0000 mg | Freq: Every day | INTRAVENOUS | Status: AC
  Administered 2024-02-01 – 2024-02-02 (×2): 40 mg via INTRAVENOUS
  Filled 2024-02-01 (×2): qty 10

## 2024-02-01 MED ORDER — PROPOFOL 10 MG/ML IV BOLUS
INTRAVENOUS | Status: AC
  Filled 2024-02-01: qty 20

## 2024-02-01 MED ORDER — SULFAMETHOXAZOLE-TRIMETHOPRIM 800-160 MG PO TABS: 1.0000 | ORAL_TABLET | Freq: Two times a day (BID) | ORAL | Status: DC

## 2024-02-01 MED ORDER — METOPROLOL TARTRATE 5 MG/5ML IV SOLN
2.5000 mg | INTRAVENOUS | Status: DC | PRN
  Administered 2024-02-07: 5 mg via INTRAVENOUS
  Filled 2024-02-01 (×2): qty 5

## 2024-02-01 MED ORDER — ASPIRIN 325 MG PO TBEC
325.0000 mg | DELAYED_RELEASE_TABLET | Freq: Every day | ORAL | Status: DC
  Administered 2024-02-02: 325 mg via ORAL
  Filled 2024-02-01: qty 1

## 2024-02-01 MED ORDER — ESCITALOPRAM OXALATE 10 MG PO TABS
10.0000 mg | ORAL_TABLET | Freq: Every day | ORAL | Status: DC
  Administered 2024-02-02 – 2024-02-09 (×8): 10 mg via ORAL
  Filled 2024-02-01 (×8): qty 1

## 2024-02-01 MED ORDER — PHENYLEPHRINE HCL-NACL 20-0.9 MG/250ML-% IV SOLN: 0.0000 ug/min | INTRAVENOUS | Status: DC

## 2024-02-01 MED ORDER — METOPROLOL TARTRATE 12.5 MG HALF TABLET
12.5000 mg | ORAL_TABLET | Freq: Two times a day (BID) | ORAL | Status: DC
  Administered 2024-02-02 – 2024-02-03 (×3): 12.5 mg via ORAL
  Filled 2024-02-01 (×3): qty 1

## 2024-02-01 MED ORDER — ACETAMINOPHEN 500 MG PO TABS
1000.0000 mg | ORAL_TABLET | Freq: Four times a day (QID) | ORAL | Status: AC
  Administered 2024-02-02 – 2024-02-06 (×18): 1000 mg via ORAL
  Filled 2024-02-01 (×16): qty 2

## 2024-02-01 MED ORDER — HEPARIN SODIUM (PORCINE) 1000 UNIT/ML IJ SOLN
INTRAMUSCULAR | Status: DC | PRN
  Administered 2024-02-01: 29000 [IU] via INTRAVENOUS

## 2024-02-01 MED ORDER — ~~LOC~~ CARDIAC SURGERY, PATIENT & FAMILY EDUCATION
Freq: Once | Status: DC
  Filled 2024-02-01: qty 1

## 2024-02-01 MED ORDER — SODIUM CHLORIDE 0.9 % IV SOLN: 250.0000 mL | INTRAVENOUS | Status: AC

## 2024-02-01 MED ORDER — VANCOMYCIN HCL IN DEXTROSE 1-5 GM/200ML-% IV SOLN
1000.0000 mg | Freq: Once | INTRAVENOUS | Status: AC
  Administered 2024-02-01: 1000 mg via INTRAVENOUS
  Filled 2024-02-01: qty 200

## 2024-02-01 MED ORDER — MIDAZOLAM HCL 2 MG/2ML IJ SOLN: 2.0000 mg | INTRAMUSCULAR | Status: DC | PRN

## 2024-02-01 MED ORDER — PROTAMINE SULFATE 10 MG/ML IV SOLN
INTRAVENOUS | Status: DC | PRN
  Administered 2024-02-01: 30 mg via INTRAVENOUS
  Administered 2024-02-01: 260 mg via INTRAVENOUS

## 2024-02-01 MED ORDER — METOPROLOL TARTRATE 12.5 MG HALF TABLET: 12.5000 mg | ORAL_TABLET | Freq: Once | ORAL | Status: DC

## 2024-02-01 MED ORDER — EPHEDRINE SULFATE-NACL 50-0.9 MG/10ML-% IV SOSY
PREFILLED_SYRINGE | INTRAVENOUS | Status: DC | PRN
  Administered 2024-02-01: 7.5 mg via INTRAVENOUS
  Administered 2024-02-01: 2.5 mg via INTRAVENOUS

## 2024-02-01 MED ORDER — METOCLOPRAMIDE HCL 5 MG/ML IJ SOLN
10.0000 mg | Freq: Four times a day (QID) | INTRAMUSCULAR | Status: AC
  Administered 2024-02-01 – 2024-02-02 (×6): 10 mg via INTRAVENOUS
  Filled 2024-02-01 (×6): qty 2

## 2024-02-01 MED ORDER — POTASSIUM CHLORIDE 10 MEQ/50ML IV SOLN
10.0000 meq | INTRAVENOUS | Status: AC
  Administered 2024-02-01 (×2): 10 meq via INTRAVENOUS

## 2024-02-01 MED ORDER — PANTOPRAZOLE SODIUM 40 MG PO TBEC
40.0000 mg | DELAYED_RELEASE_TABLET | Freq: Every day | ORAL | Status: DC
  Administered 2024-02-03 – 2024-02-09 (×7): 40 mg via ORAL
  Filled 2024-02-01 (×7): qty 1

## 2024-02-01 MED ORDER — ACETAMINOPHEN 160 MG/5ML PO SOLN
650.0000 mg | Freq: Once | ORAL | Status: AC
  Administered 2024-02-01: 650 mg
  Filled 2024-02-01: qty 20.3

## 2024-02-01 MED ORDER — LACTATED RINGERS IV SOLN: INTRAVENOUS | Status: AC

## 2024-02-01 MED ORDER — PROPOFOL 10 MG/ML IV BOLUS
INTRAVENOUS | Status: DC | PRN
  Administered 2024-02-01: 70 mg via INTRAVENOUS
  Administered 2024-02-01: 200 mg via INTRAVENOUS
  Administered 2024-02-01: 80 mg via INTRAVENOUS

## 2024-02-01 MED ORDER — ROCURONIUM BROMIDE 10 MG/ML (PF) SYRINGE
PREFILLED_SYRINGE | INTRAVENOUS | Status: DC | PRN
  Administered 2024-02-01: 80 mg via INTRAVENOUS
  Administered 2024-02-01 (×2): 50 mg via INTRAVENOUS
  Administered 2024-02-01 (×2): 20 mg via INTRAVENOUS

## 2024-02-01 MED ORDER — METOPROLOL TARTRATE 25 MG/10 ML ORAL SUSPENSION: 12.5000 mg | Freq: Two times a day (BID) | ORAL | Status: DC

## 2024-02-01 MED ORDER — CHLORHEXIDINE GLUCONATE CLOTH 2 % EX PADS
6.0000 | MEDICATED_PAD | Freq: Every day | CUTANEOUS | Status: DC
  Administered 2024-02-01 – 2024-02-02 (×2): 6 via TOPICAL

## 2024-02-01 MED ORDER — CALCIUM CHLORIDE 10 % IV SOLN
INTRAVENOUS | Status: DC | PRN
  Administered 2024-02-01: .75 g via INTRAVENOUS

## 2024-02-01 MED ORDER — PLASMA-LYTE A IV SOLN
INTRAVENOUS | Status: DC | PRN
  Administered 2024-02-01: 500 mL via INTRAVASCULAR

## 2024-02-01 MED ORDER — DEXTROSE 50 % IV SOLN: 0.0000 mL | INTRAVENOUS | Status: DC | PRN

## 2024-02-01 MED ORDER — ONDANSETRON HCL 4 MG/2ML IJ SOLN
4.0000 mg | Freq: Four times a day (QID) | INTRAMUSCULAR | Status: DC | PRN
  Administered 2024-02-02 – 2024-02-04 (×3): 4 mg via INTRAVENOUS
  Filled 2024-02-01 (×3): qty 2

## 2024-02-01 MED ORDER — NITROGLYCERIN IN D5W 200-5 MCG/ML-% IV SOLN: 0.0000 ug/min | INTRAVENOUS | Status: DC

## 2024-02-01 MED ORDER — DEXMEDETOMIDINE HCL IN NACL 400 MCG/100ML IV SOLN
0.0000 ug/kg/h | INTRAVENOUS | Status: DC
  Administered 2024-02-01: 0.7 ug/kg/h via INTRAVENOUS
  Filled 2024-02-01: qty 100

## 2024-02-01 MED ORDER — MORPHINE SULFATE (PF) 2 MG/ML IV SOLN
1.0000 mg | INTRAVENOUS | Status: DC | PRN
  Administered 2024-02-01 (×2): 2 mg via INTRAVENOUS
  Administered 2024-02-01: 1 mg via INTRAVENOUS
  Administered 2024-02-02: 2 mg via INTRAVENOUS
  Administered 2024-02-02: 4 mg via INTRAVENOUS
  Administered 2024-02-03 (×2): 2 mg via INTRAVENOUS
  Filled 2024-02-01 (×4): qty 1
  Filled 2024-02-01: qty 2
  Filled 2024-02-01 (×4): qty 1

## 2024-02-01 MED ORDER — ASPIRIN 81 MG PO CHEW
324.0000 mg | CHEWABLE_TABLET | Freq: Every day | ORAL | Status: DC
  Filled 2024-02-01: qty 4

## 2024-02-01 MED ORDER — ALBUMIN HUMAN 5 % IV SOLN
250.0000 mL | INTRAVENOUS | Status: DC | PRN
  Administered 2024-02-01 (×3): 12.5 g via INTRAVENOUS
  Filled 2024-02-01: qty 250

## 2024-02-01 MED ORDER — SODIUM CHLORIDE 0.45 % IV SOLN: INTRAVENOUS | Status: AC | PRN

## 2024-02-01 MED ORDER — CHLORHEXIDINE GLUCONATE CLOTH 2 % EX PADS
6.0000 | MEDICATED_PAD | Freq: Every day | CUTANEOUS | Status: DC
  Administered 2024-02-02 – 2024-02-09 (×8): 6 via TOPICAL

## 2024-02-01 MED ORDER — CHLORHEXIDINE GLUCONATE 0.12 % MT SOLN
15.0000 mL | Freq: Once | OROMUCOSAL | Status: AC
  Administered 2024-02-01: 15 mL via OROMUCOSAL
  Filled 2024-02-01: qty 15

## 2024-02-01 MED ORDER — FENTANYL CITRATE (PF) 250 MCG/5ML IJ SOLN
INTRAMUSCULAR | Status: DC | PRN
  Administered 2024-02-01: 150 ug via INTRAVENOUS
  Administered 2024-02-01: 50 ug via INTRAVENOUS
  Administered 2024-02-01 (×2): 100 ug via INTRAVENOUS
  Administered 2024-02-01: 50 ug via INTRAVENOUS
  Administered 2024-02-01: 150 ug via INTRAVENOUS
  Administered 2024-02-01: 200 ug via INTRAVENOUS
  Administered 2024-02-01: 100 ug via INTRAVENOUS
  Administered 2024-02-01: 50 ug via INTRAVENOUS
  Administered 2024-02-01: 150 ug via INTRAVENOUS
  Administered 2024-02-01: 50 ug via INTRAVENOUS
  Administered 2024-02-01: 100 ug via INTRAVENOUS

## 2024-02-01 MED ORDER — TRAMADOL HCL 50 MG PO TABS
50.0000 mg | ORAL_TABLET | ORAL | Status: DC | PRN
  Administered 2024-02-03 – 2024-02-07 (×4): 100 mg via ORAL
  Filled 2024-02-01 (×4): qty 2

## 2024-02-01 MED ORDER — SODIUM CHLORIDE 0.9% FLUSH
10.0000 mL | Freq: Two times a day (BID) | INTRAVENOUS | Status: DC
  Administered 2024-02-01 – 2024-02-03 (×5): 10 mL

## 2024-02-01 MED ORDER — CLEVIDIPINE BUTYRATE 0.5 MG/ML IV EMUL: 0.0000 mg/h | INTRAVENOUS | Status: DC

## 2024-02-01 MED ORDER — MIDAZOLAM HCL (PF) 10 MG/2ML IJ SOLN
INTRAMUSCULAR | Status: AC
Start: 2024-02-01 — End: 2024-02-01
  Filled 2024-02-01: qty 2

## 2024-02-01 MED ORDER — CHLORHEXIDINE GLUCONATE 0.12 % MT SOLN
15.0000 mL | OROMUCOSAL | Status: AC
  Filled 2024-02-01: qty 15

## 2024-02-01 MED ORDER — ACETAMINOPHEN 160 MG/5ML PO SOLN: 1000.0000 mg | Freq: Four times a day (QID) | ORAL | Status: AC

## 2024-02-01 MED ORDER — BISACODYL 5 MG PO TBEC
10.0000 mg | DELAYED_RELEASE_TABLET | Freq: Every day | ORAL | Status: DC
  Administered 2024-02-02 – 2024-02-09 (×6): 10 mg via ORAL
  Filled 2024-02-01 (×8): qty 2

## 2024-02-01 MED ORDER — DOCUSATE SODIUM 100 MG PO CAPS
200.0000 mg | ORAL_CAPSULE | Freq: Every day | ORAL | Status: DC
  Administered 2024-02-02 – 2024-02-09 (×6): 200 mg via ORAL
  Filled 2024-02-01 (×8): qty 2

## 2024-02-01 MED ORDER — BISACODYL 10 MG RE SUPP: 10.0000 mg | Freq: Every day | RECTAL | Status: DC

## 2024-02-01 MED ORDER — 0.9 % SODIUM CHLORIDE (POUR BTL) OPTIME
TOPICAL | Status: DC | PRN
  Administered 2024-02-01: 5000 mL

## 2024-02-01 MED ORDER — CHLORHEXIDINE GLUCONATE 4 % EX SOLN: 30.0000 mL | CUTANEOUS | Status: DC

## 2024-02-01 MED ORDER — MAGNESIUM SULFATE 4 GM/100ML IV SOLN
4.0000 g | Freq: Once | INTRAVENOUS | Status: AC
  Administered 2024-02-01: 4 g via INTRAVENOUS
  Filled 2024-02-01: qty 100

## 2024-02-01 MED ORDER — LACTATED RINGERS IV SOLN: INTRAVENOUS | Status: DC | PRN

## 2024-02-01 MED ORDER — SODIUM CHLORIDE 0.9% FLUSH: 3.0000 mL | INTRAVENOUS | Status: DC | PRN

## 2024-02-01 MED ORDER — CEFAZOLIN SODIUM-DEXTROSE 2-4 GM/100ML-% IV SOLN
2.0000 g | Freq: Three times a day (TID) | INTRAVENOUS | Status: AC
  Administered 2024-02-01 – 2024-02-03 (×6): 2 g via INTRAVENOUS
  Filled 2024-02-01 (×6): qty 100

## 2024-02-01 MED ORDER — PHENYLEPHRINE 80 MCG/ML (10ML) SYRINGE FOR IV PUSH (FOR BLOOD PRESSURE SUPPORT)
PREFILLED_SYRINGE | INTRAVENOUS | Status: DC | PRN
  Administered 2024-02-01: 80 ug via INTRAVENOUS
  Administered 2024-02-01: 40 ug via INTRAVENOUS
  Administered 2024-02-01: 80 ug via INTRAVENOUS

## 2024-02-01 MED ORDER — INSULIN REGULAR(HUMAN) IN NACL 100-0.9 UT/100ML-% IV SOLN: INTRAVENOUS | Status: DC

## 2024-02-01 MED ORDER — NITROGLYCERIN 0.2 MG/ML ON CALL CATH LAB
INTRAVENOUS | Status: DC | PRN
Start: 2024-02-01 — End: 2024-02-01
  Administered 2024-02-01 (×4): 40 ug via INTRAVENOUS
  Administered 2024-02-01: 60 ug via INTRAVENOUS

## 2024-02-01 MED ORDER — CLEVIDIPINE BUTYRATE 0.5 MG/ML IV EMUL
INTRAVENOUS | Status: DC | PRN
  Administered 2024-02-01: 2 mg/h via INTRAVENOUS

## 2024-02-01 MED ORDER — SODIUM CHLORIDE 0.9 % IV SOLN: INTRAVENOUS | Status: AC

## 2024-02-01 MED ORDER — SODIUM CHLORIDE (PF) 0.9 % IJ SOLN: OROMUCOSAL | Status: DC | PRN

## 2024-02-01 SURGICAL SUPPLY — 92 items
ADAPTER MULTI PERFUSION 15 (ADAPTER) ×3 IMPLANT
APPLICATOR PREVELEAK SEALANT (TIP) ×1 IMPLANT
BAG DECANTER FOR FLEXI CONT (MISCELLANEOUS) ×3 IMPLANT
BLADE CLIPPER SURG (BLADE) ×4 IMPLANT
BLADE MINI RND TIP GREEN BEAV (BLADE) ×2 IMPLANT
BLADE STERNUM SYSTEM 6 (BLADE) ×3 IMPLANT
BLADE SURG 11 STRL SS (BLADE) ×1 IMPLANT
BLADE SURG 15 STRL LF DISP TIS (BLADE) ×3 IMPLANT
BNDG ELASTIC 4INX 5YD STR LF (GAUZE/BANDAGES/DRESSINGS) ×1 IMPLANT
BNDG ELASTIC 4X5.8 VLCR STR LF (GAUZE/BANDAGES/DRESSINGS) ×6 IMPLANT
BNDG ELASTIC 6INX 5YD STR LF (GAUZE/BANDAGES/DRESSINGS) ×3 IMPLANT
BNDG GAUZE DERMACEA FLUFF 4 (GAUZE/BANDAGES/DRESSINGS) ×3 IMPLANT
CANISTER SUCTION 3000ML PPV (SUCTIONS) ×3 IMPLANT
CANNULA AORTIC ROOT 9FR (CANNULA) ×3 IMPLANT
CANNULA MC2 2 STG 29/37 NON-V (CANNULA) ×3 IMPLANT
CANNULA NON VENT 20FR 12 (CANNULA) ×3 IMPLANT
CATH ROBINSON RED A/P 18FR (CATHETERS) ×6 IMPLANT
CLIP APPLIE 9.375 SM OPEN (CLIP) ×2 IMPLANT
CLIP RETRACTION 3.0MM CORONARY (MISCELLANEOUS) IMPLANT
CLIP TI MEDIUM 24 (CLIP) IMPLANT
CLIP TI WIDE RED SMALL 24 (CLIP) IMPLANT
CONNECTOR BLAKE 2:1 CARIO BLK (MISCELLANEOUS) ×3 IMPLANT
CONTAINER PROTECT SURGISLUSH (MISCELLANEOUS) ×6 IMPLANT
COVER MAYO STAND STRL (DRAPES) ×3 IMPLANT
CUFF TOURN SGL QUICK 18X4 (TOURNIQUET CUFF) IMPLANT
CUFF TRNQT CYL 24X4X16.5-23 (TOURNIQUET CUFF) IMPLANT
DERMABOND ADVANCED .7 DNX12 (GAUZE/BANDAGES/DRESSINGS) ×1 IMPLANT
DRAIN CHANNEL 19F RND (DRAIN) ×9 IMPLANT
DRAIN CONNECTOR BLAKE 1:1 (MISCELLANEOUS) ×1 IMPLANT
DRAPE EXTREMITY T 121X128X90 (DISPOSABLE) ×3 IMPLANT
DRAPE HALF SHEET 40X57 (DRAPES) ×3 IMPLANT
DRAPE INCISE IOBAN 66X45 STRL (DRAPES) IMPLANT
DRAPE SRG 135X102X78XABS (DRAPES) ×3 IMPLANT
DRAPE WARM FLUID 44X44 (DRAPES) ×3 IMPLANT
DRSG AQUACEL AG ADV 3.5X10 (GAUZE/BANDAGES/DRESSINGS) ×3 IMPLANT
DRSG AQUACEL AG ADV 3.5X14 (GAUZE/BANDAGES/DRESSINGS) ×2 IMPLANT
ELECTRODE BLDE 4.0 EZ CLN MEGD (MISCELLANEOUS) ×3 IMPLANT
ELECTRODE REM PT RTRN 9FT ADLT (ELECTROSURGICAL) ×6 IMPLANT
FELT TEFLON 1X6 (MISCELLANEOUS) ×5 IMPLANT
GAUZE 4X4 16PLY ~~LOC~~+RFID DBL (SPONGE) ×2 IMPLANT
GAUZE SPONGE 4X4 12PLY STRL (GAUZE/BANDAGES/DRESSINGS) ×6 IMPLANT
GEL ULTRASOUND 20GR AQUASONIC (MISCELLANEOUS) ×2 IMPLANT
GLOVE BIO SURGEON STRL SZ7 (GLOVE) ×6 IMPLANT
GLOVE BIOGEL M STRL SZ7.5 (GLOVE) ×6 IMPLANT
GOWN STRL REUS W/ TWL LRG LVL3 (GOWN DISPOSABLE) ×12 IMPLANT
GOWN STRL REUS W/ TWL XL LVL3 (GOWN DISPOSABLE) ×6 IMPLANT
HEMOSTAT POWDER SURGIFOAM 1G (HEMOSTASIS) ×6 IMPLANT
INSERT SUTURE HOLDER (MISCELLANEOUS) ×3 IMPLANT
KIT BASIN OR (CUSTOM PROCEDURE TRAY) ×3 IMPLANT
KIT TURNOVER KIT B (KITS) ×6 IMPLANT
KIT VASOVIEW HEMOPRO 2 VH 4000 (KITS) ×3 IMPLANT
KNIFE MICRO-UNI 3.5 30 DEG (BLADE) ×1 IMPLANT
LEAD PACING MYOCARDI (MISCELLANEOUS) ×3 IMPLANT
MARKER DISTAL GRAFT W/ HOLDER (MISCELLANEOUS) ×9 IMPLANT
NS IRRIG 1000ML POUR BTL (IV SOLUTION) ×15 IMPLANT
PACK E OPEN HEART (SUTURE) ×3 IMPLANT
PACK OPEN HEART (CUSTOM PROCEDURE TRAY) ×3 IMPLANT
PAD ARMBOARD POSITIONER FOAM (MISCELLANEOUS) ×12 IMPLANT
PAD ELECT DEFIB RADIOL ZOLL (MISCELLANEOUS) ×3 IMPLANT
PENCIL BUTTON HOLSTER BLD 10FT (ELECTRODE) ×3 IMPLANT
POSITIONER HEAD DONUT 9IN (MISCELLANEOUS) ×3 IMPLANT
PUNCH AORTIC ROTATE 4.0MM (MISCELLANEOUS) ×3 IMPLANT
SEALANT HEMOST PREVELEAK 4ML (HEMOSTASIS) ×1 IMPLANT
SET MPS 3-ND DEL (MISCELLANEOUS) ×1 IMPLANT
SHEARS HARMONIC 9CM CVD (BLADE) ×2 IMPLANT
SOLUTION ANTFG W/FOAM PAD STRL (MISCELLANEOUS) ×1 IMPLANT
STOPCOCK 4 WAY LG BORE MALE ST (IV SETS) ×1 IMPLANT
SUPPORT HEART JANKE-BARRON (MISCELLANEOUS) ×3 IMPLANT
SUT ETHIBOND X763 2 0 SH 1 (SUTURE) ×6 IMPLANT
SUT MNCRL AB 3-0 PS2 18 (SUTURE) ×7 IMPLANT
SUT PDS AB 1 CTX 36 (SUTURE) ×6 IMPLANT
SUT PROLENE 4 0 SH DA (SUTURE) ×3 IMPLANT
SUT PROLENE 4-0 RB1 .5 CRCL 36 (SUTURE) ×2 IMPLANT
SUT PROLENE 5 0 C 1 36 (SUTURE) ×13 IMPLANT
SUT PROLENE 7 0 BV 1 (SUTURE) ×6 IMPLANT
SUT PROLENE 7 0 BV1 MDA (SUTURE) ×4 IMPLANT
SUT STEEL 6MS V (SUTURE) ×6 IMPLANT
SUT VIC AB 2-0 CT1 TAPERPNT 27 (SUTURE) IMPLANT
SUT VIC AB 3-0 SH 27X BRD (SUTURE) IMPLANT
SUT VIC AB 3-0 X1 27 (SUTURE) IMPLANT
SYR 50ML SLIP (SYRINGE) IMPLANT
SYSTEM SAHARA CHEST DRAIN ATS (WOUND CARE) ×3 IMPLANT
TAPE CLOTH SURG 4X10 WHT LF (GAUZE/BANDAGES/DRESSINGS) ×1 IMPLANT
TAPE PAPER 2X10 WHT MICROPORE (GAUZE/BANDAGES/DRESSINGS) ×1 IMPLANT
TOWEL GREEN STERILE (TOWEL DISPOSABLE) ×6 IMPLANT
TOWEL GREEN STERILE FF (TOWEL DISPOSABLE) ×6 IMPLANT
TRAY FOLEY SLVR 16FR TEMP STAT (SET/KITS/TRAYS/PACK) ×3 IMPLANT
TUBE SUCT INTRACARD DLP 20F (MISCELLANEOUS) ×3 IMPLANT
TUBE SUCTION CARDIAC 10FR (CANNULA) ×3 IMPLANT
TUBING LAP HI FLOW INSUFFLATIO (TUBING) ×3 IMPLANT
UNDERPAD 30X36 HEAVY ABSORB (UNDERPADS AND DIAPERS) ×5 IMPLANT
WATER STERILE IRR 1000ML POUR (IV SOLUTION) ×6 IMPLANT

## 2024-02-01 NOTE — Brief Op Note (Signed)
 02/01/2024  10:07 AM  PATIENT:  Christine Hart  78 y.o. female  PRE-OPERATIVE DIAGNOSIS:  CORONARY ARTERY DISEASE  POST-OPERATIVE DIAGNOSIS:  CORONARY ARTERY DISEASE  PROCEDURE:  CORONARY ARTERY BYPASS GRAFTING (CABG)  ENDOSCOPIC HARVEST OF THE RIGHT GREATER SAPHENOUS VEIN ECHOCARDIOGRAM, TRANSESOPHAGEAL  Vein harvest time: Vein prep time: -LIMA to LAD -SVG to OM -SVG to Diagonal  SURGEON:  Surgeons and Role:    * Shyrl Linnie KIDD, MD - Primary  PHYSICIAN ASSISTANT: Con Bend PA-C  ASSISTANTS: Jerel Fees RNFA   ANESTHESIA:   general  EBL:  Per perfusion records  BLOOD ADMINISTERED: Per perfusion records  DRAINS: Mediastinal drains   LOCAL MEDICATIONS USED:  NONE  SPECIMEN:  No Specimen  DISPOSITION OF SPECIMEN:  N/A  COUNTS:  YES  TOURNIQUET:  * No tourniquets in log *  DICTATION: .Dragon Dictation  PLAN OF CARE: Admit to inpatient   PATIENT DISPOSITION:  ICU - intubated and hemodynamically stable.   Delay start of Pharmacological VTE agent (>24hrs) due to surgical blood loss or risk of bleeding: yes

## 2024-02-01 NOTE — Transfer of Care (Signed)
 Immediate Anesthesia Transfer of Care Note  Patient: Christine Hart  Procedure(s) Performed: CORONARY ARTERY BYPASS GRAFTING X 3, USING LEFT INTERNAL MAMMARY ARTERY AND ENDOSCOPIC HARVESTED RIGHT SAPHENOUS VEIN, ENDARTERECTOMY LEFT ANTERIOR DESCENDING CORONARY ARTERY (Chest) ECHOCARDIOGRAM, TRANSESOPHAGEAL  Patient Location: SICU  Anesthesia Type:General  Level of Consciousness: sedated and Patient remains intubated per anesthesia plan  Airway & Oxygen Therapy: Patient remains intubated per anesthesia plan and Patient placed on Ventilator (see vital sign flow sheet for setting)  Post-op Assessment: Report given to RN and Post -op Vital signs reviewed and stable  Post vital signs: Reviewed and stable  Last Vitals:  Vitals Value Taken Time  BP    Temp 36.3 C 02/01/24 14:21  Pulse 63 02/01/24 14:21  Resp 14 02/01/24 14:21  SpO2 99 % 02/01/24 14:21  Vitals shown include unfiled device data.  Last Pain:  Vitals:   02/01/24 0600  TempSrc:   PainSc: 6       Patients Stated Pain Goal: 2 (02/01/24 0600)  Complications: No notable events documented.

## 2024-02-01 NOTE — Op Note (Signed)
 [address]       [name] [address]             [phone]                                          02/01/2024 Patient:  Christine Hart Pre-Op Dx: 3V CAD HTN HLP    Post-op Dx:  same Procedure: CABG X 3.  LIMA LAD, RSVG OM, diagonal LAD endarterectomy   Endoscopic greater saphenous vein harvest on the right   Surgeon and Role:      * Analya Louissaint, Linnie KIDD, MD - Primary    * B. Raguel , PA-C - assisting An experienced assistant was required given the complexity of this surgery and the standard of surgical care. The assistant was needed for exposure, dissection, suctioning, retraction of delicate tissues and sutures, instrument exchange and for overall help during this procedure.    Anesthesia  general EBL:  Blood Administration: none Xclamp Time:  110 min Pump Time:   Drains: 19 F blake drain: R, L, mediastinal  Wires: V Counts: correct   Indications: 78 y.o. female with 3V CAD, preserved biventricular function and no significant valvular disease.  On review of her LHC, she has several tandem lesions in her LAD, but I think that we should be able to bridge the midvessel lesion to cover a large territory.  She also will need grafting to the diagonal and an OM.   Findings: Good vein.  Small LIMA.  Small OM and diagonal.  Attempted to bridge the mid LAD lesion.  The LAD was heavily calcified.  The 1st anastomosis was not hemostatic after removing the cross clamp.  We reclamped, and performed an endarterectomy.  The LIMA was then connected for a better result.    Operative Technique: All invasive lines were placed in pre-op holding.  After the risks, benefits and alternatives were thoroughly discussed, the patient was brought to the operative theatre.  Anesthesia was induced, and the patient was prepped and draped in normal sterile fashion.  An appropriate surgical pause was performed, and pre-operative antibiotics were dosed  accordingly.  We began with simultaneous incisions along the right leg for harvesting of the greater saphenous vein and the chest for the sternotomy.  In regards to the sternotomy, this was carried down with bovie cautery, and the sternum was divided with a reciprocating saw.  Meticulous hemostasis was obtained.  The left internal thoracic artery was exposed and harvested in in pedicled fashion.  The patient was systemically heparinized, and the artery was divided distally, and placed in a papaverine  sponge.    The sternal elevator was removed, and a retractor was placed.  The pericardium was divided in the midline and fashioned into a cradle with pericardial stitches.   After we confirmed an appropriate ACT, the ascending aorta was cannulated in standard fashion.  The right atrial appendage was used for venous cannulation site.  Cardiopulmonary bypass was initiated, and the heart retractor was placed. The cross clamp was applied, and a dose of anterograde cardioplegia was given with good arrest of the heart.  Next we exposed the lateral wall, and found a good target on the OM.  An end to side anastomosis with the vein graft was then created.  Next, we exposed the anterior wall of the heart and identified a  good target on diagonal.   An arteriotomy was created.  The vein was anastomosed in an end to side fashion.  Finally, we exposed a good target on the LAD, we attempted to bridge the mid LAD lesion.  The LAD was heavily calcified.  The 1st anastomosis was not hemostatic after removing the cross clamp.  We reclamped, and performed an endarterectomy.  The LIMA was then connected for a better result.  We began to re-warm, and a re-animation dose of cardioplegia was given.  The heart was de-aired, and the cross clamp was removed.  Meticulous hemostasis was obtained.    A partial occludding clamp was then placed on the ascending aorta, and we created an end to side anastomosis between it and the proximal vein  grafts.  Rings were placed on the proximal anastomosis.  Hemostasis was obtained, and we separated from cardiopulmonary bypass without event.  The heparin  was reversed with protamine .  Chest tubes and wires were placed, and the sternum was re-approximated with sternal wires.  The soft tissue and skin were re-approximated wth absorbable suture.    The patient tolerated the procedure without any immediate complications, and was transferred to the ICU in guarded condition.  Christine Hart MALVA Rayas

## 2024-02-01 NOTE — Procedures (Signed)
 Extubation Procedure Note  Patient Details:   Name: Christine Hart DOB: [birth year] MRN: [id number]   Airway Documentation:    Vent end date: 02/01/24 Vent end time: 2223   Evaluation  O2 sats: stable throughout Complications: No apparent complications Patient did tolerate procedure well. Bilateral Breath Sounds: Diminished   Yes  Pt extubated to 4L nasal cannula per rapid wean protocol. VC 1.4L, NIF -26cmH2O, positive cuff leak noted prior to extubation. Pt comfortable at 100% SpO2. Stridor absent on auscultation.   Damien FORBES Rummer 02/01/2024, 10:23 PM

## 2024-02-01 NOTE — Anesthesia Procedure Notes (Signed)
 Procedure Name: Intubation Date/Time: 02/01/2024 8:01 AM  Performed by: Mannie Krystal LABOR, CRNAPre-anesthesia Checklist: Patient identified, Emergency Drugs available, Suction available and Patient being monitored Patient Re-evaluated:Patient Re-evaluated prior to induction Oxygen Delivery Method: Circle system utilized Preoxygenation: Pre-oxygenation with 100% oxygen Induction Type: IV induction Ventilation: Mask ventilation without difficulty Laryngoscope Size: Glidescope and 3 Grade View: Grade II Tube type: Oral Tube size: 8.0 mm Number of attempts: 1 Airway Equipment and Method: Stylet and Oral airway Placement Confirmation: ETT inserted through vocal cords under direct vision, positive ETCO2 and breath sounds checked- equal and bilateral Secured at: 23 cm Tube secured with: Tape Dental Injury: Teeth and Oropharynx as per pre-operative assessment  Difficulty Due To: Difficulty was anticipated

## 2024-02-01 NOTE — Discharge Instructions (Signed)

## 2024-02-01 NOTE — Anesthesia Procedure Notes (Signed)
 Central Venous Catheter Insertion Performed by: Tilford Franky BIRCH, MD, anesthesiologist Start/End7/04/2024 7:05 AM, 02/01/2024 7:10 AM Patient location: Pre-op. Preanesthetic checklist: patient identified, IV checked, site marked, risks and benefits discussed, surgical consent, monitors and equipment checked, pre-op evaluation, timeout performed and anesthesia consent Lidocaine  1% used for infiltration and patient sedated Hand hygiene performed  and maximum sterile barriers used  Total catheter length 16. Central line was placed.Triple lumen Procedure performed without using ultrasound guided technique. Attempts: 1 Patient tolerated the procedure well with no immediate complications.

## 2024-02-01 NOTE — Progress Notes (Signed)
 Echocardiogram Echocardiogram Transesophageal has been performed.  Thea Norlander 02/01/2024, 10:48 AM

## 2024-02-01 NOTE — Interval H&P Note (Signed)
 History and Physical Interval Note:  02/01/2024 7:29 AM  Christine Hart  has presented today for surgery, with the diagnosis of CAD.  The various methods of treatment have been discussed with the patient and family. After consideration of risks, benefits and other options for treatment, the patient has consented to  Procedure(s) with comments: CORONARY ARTERY BYPASS GRAFTING (CABG) (N/A) SURGICAL PROCUREMENT, ARTERY, RADIAL (Left) - possible left radial artery harvest ECHOCARDIOGRAM, TRANSESOPHAGEAL (N/A) as a surgical intervention.  The patient's history has been reviewed, patient examined, no change in status, stable for surgery.  I have reviewed the patient's chart and labs.  Questions were answered to the patient's satisfaction.     Trudie Cervantes MALVA Rayas

## 2024-02-01 NOTE — Anesthesia Procedure Notes (Signed)
 Arterial Line Insertion Start/End7/04/2024 7:15 AM, 02/01/2024 7:20 AM Performed by: Tilford Franky BIRCH, MD, anesthesiologist  Patient location: Pre-op. Preanesthetic checklist: patient identified, IV checked, site marked, risks and benefits discussed, surgical consent, monitors and equipment checked, pre-op evaluation, timeout performed and anesthesia consent Lidocaine  1% used for infiltration Right, radial was placed Catheter size: 20 G Hand hygiene performed  and maximum sterile barriers used   Attempts: 1 Procedure performed without using ultrasound guided technique. Following insertion, dressing applied and Biopatch. Post procedure assessment: normal and unchanged  Post procedure complications: second provider assisted. Patient tolerated the procedure well with no immediate complications.

## 2024-02-01 NOTE — Anesthesia Postprocedure Evaluation (Signed)
 Anesthesia Post Note  Patient: Christine Hart  Procedure(s) Performed: CORONARY ARTERY BYPASS GRAFTING X 3, USING LEFT INTERNAL MAMMARY ARTERY AND ENDOSCOPIC HARVESTED RIGHT SAPHENOUS VEIN, ENDARTERECTOMY LEFT ANTERIOR DESCENDING CORONARY ARTERY (Chest) ECHOCARDIOGRAM, TRANSESOPHAGEAL     Patient location during evaluation: SICU Anesthesia Type: General Level of consciousness: sedated Pain management: pain level controlled Vital Signs Assessment: post-procedure vital signs reviewed and stable Respiratory status: patient remains intubated per anesthesia plan Cardiovascular status: stable Postop Assessment: no apparent nausea or vomiting Anesthetic complications: no   No notable events documented.  Last Vitals:  Vitals:   02/01/24 0730 02/01/24 1414  BP:    Pulse: (!) 54   Resp: 11   Temp:    SpO2: 99% 99%    Last Pain:  Vitals:   02/01/24 0600  TempSrc:   PainSc: 6                  Franky JONETTA Bald

## 2024-02-01 NOTE — Anesthesia Procedure Notes (Signed)
 Central Venous Catheter Insertion Performed by: Tilford Franky BIRCH, MD, anesthesiologist Start/End7/04/2024 6:55 AM, 02/01/2024 7:05 AM Patient location: Pre-op. Preanesthetic checklist: patient identified, IV checked, site marked, risks and benefits discussed, surgical consent, monitors and equipment checked, pre-op evaluation, timeout performed and anesthesia consent Position: Trendelenburg Lidocaine  1% used for infiltration and patient sedated Hand hygiene performed , maximum sterile barriers used  and Seldinger technique used Catheter size: 8.5 Fr Total catheter length 10. Central line was placed.Sheath introducer Swan type:thermodilution PA Cath depth:50 Procedure performed using ultrasound guided technique. Ultrasound Notes:anatomy identified, needle tip was noted to be adjacent to the nerve/plexus identified, no ultrasound evidence of intravascular and/or intraneural injection and image(s) printed for medical record Attempts: 1 Following insertion, line sutured and dressing applied. Post procedure assessment: blood return through all ports, free fluid flow and no air  Patient tolerated the procedure well with no immediate complications.

## 2024-02-01 NOTE — Hospital Course (Addendum)
 History of Present Illness:     Polly Barner is a 78 y.o. female with a past medical history of CAD, mitral valve prolapse, SVT, hypertension, hyperlipidemia, arthritis, chronic back pain, and anxiety. She presents for surgical evaluation of multivessel CAD.  She has been symptomatic with occasional chest tightness, and pressure. This occur when she is anxious or sleeping. She also reports some fatigue that has persisted for several months. Cardiac catheterization on 06/23 showed severe diffuse LAD stenosis, severe mid circumflex and first obtuse marginal stenoses and moderate to severe proximal and mid RCA stenoses. Echocardiogram 06/26 showed LVEF 70-75%, tricial mitral valve regurgitation, aortic valve sclerosis without stenosis.   Dr. Shyrl reviewed the patient's diagnostic studies and determined she would benefit from surgical intervention. He reviewed the treatment options as well as the risks and benefits of surgery with the patient. Ms. Burlison was agreeable to proceed with surgery.  Hospital Course: Ms. Sweezy presented to [facility] and was brought to the operating room on 02/01/24. She underwent CABG x 3 utilizing LIMA to LAD, SVG to OM and SVG to Diagonal as well as endoscopic harvest of the greater saphenous vein. She tolerated the procedure well and was transferred to the SICU in stable condition. Drips were weaned as hemodynamics tolerated. Swan ganz catheter and arterial lines were removed without complication. Epicardial pacing wires were removed on POD1 without complication. She was started on Plavix  for endarterectomy. She had post bypass vasoplegia that improved with albumin .

## 2024-02-01 NOTE — Consult Note (Signed)
 NAME:  Christine Hart, MRN:  [id number], DOB:  04-22-1946, LOS: 0 ADMISSION DATE:  02/01/2024, CONSULTATION DATE:  02/01/24 REFERRING MD:  Dr. Shyrl, CHIEF COMPLAINT:  post op CABG   History of Present Illness:  Pt encephalopathic, therefore HPI obtained from EMR review.   69 yoF with PMH significant for CAD, mitral valve prolapse, SVT, HTN, HLD, chronic back pain, arthritis, and anxiety with reported several month hx of fatigue with occasional chest tightness and pressure found to have multivessel CAD on [facility] 6/23.  TTE 6/26 with LVEF 70-75%, trivial MR.  Pt underwent CABG x 3> LIMA -LAD, SVG to OM, and SVG to diagonal on 7/10 by Dr. Shyrl.  Noted by anesthesia to be difficult airway due to prior cervical spine surgeries and hx of TMJ.  Intra op, LAD noted to be heavily calcified with initial anastomosis not hemostatic after removing cross clamp requiring aortic reclamping, endarterectomy and LIMA connected with resultant hemostasis and transition off CPB.  PCCM consulted for vent and post op medical management in ICU.   Pertinent  Medical History   Past Medical History:  Diagnosis Date   Anxiety    Arthritis    Cancer (HCC)    basal and squamous cell carcinoma. Lesion excisions in 2023.   Complication of anesthesia    Limited mouth opening and neck mobility, s/p TMJ surgeries    Difficult intubation    Fibromyalgia    Hypertension    MVP (mitral valve prolapse)    Pneumonia 2023   Hospitalized for 2 nights, [place].   PONV (postoperative nausea and vomiting)    Significant Hospital Events: Including procedures, antibiotic start and stop dates in addition to other pertinent events   7/10 CABG x 3  Interim History / Subjective:  More hypertensive in OR, liable at times on low dose Neo or Cleviprex  H/H  6.8/20 s/p cell saver 250, albumin  x2, total IVF 1600 CVP 10 UOP 1.3L, EBL 850 ml  Objective    Blood pressure (!) 159/66, pulse (!) 54, temperature 97.7 F (36.5  C), temperature source Oral, resp. rate 11, height 5' 6 (1.676 m), weight 76.7 kg, SpO2 99%.        Intake/Output Summary (Last 24 hours) at 02/01/2024 1355 Last data filed at 02/01/2024 1352 Gross per 24 hour  Intake 2225 ml  Output 1000 ml  Net 1225 ml   Filed Weights   02/01/24 0545  Weight: 76.7 kg   Examination: General:  critically ill older female intubated and sedated on MV in NAD HEENT: MM pink/moist, ETT 8/ 25 at lip, pupils 2/r, anicteric, limited movement of jaw  Neuro: sedated CV: rr, SR 60's, occasional vpaced, sternal dressing cdi,  CT x 3 with staining at insertion site- 60ml output on arrival, R radial aline, R internal jugular CVL, warm/ +dp PULM:  MV supported, clear, no wheeze GI: soft, bs hypo, foley cyu Extremities: warm/dry, RLE dressing CDI, no LLE edema Skin: no rashes   CPB 1020- 1301 CVP 8, CI 2, CO 3.7, SVR 1380, SV 59  Labs reviewed> H/H 6.8/ 20, plts 153, K 4.4, sCr 9/ 0.5, glucose 153, ABG 7.415/ 35/402/22.6  Resolved problem list   Assessment and Plan   Multivessel CAD s/p CABG x3, LIMA -LAD, SVG-OM, SVG-Diagonal Post bypass vasoplegia Acute postoperative respiratory insufficiency Difficult airway - due to prior cervical spine surgery, TMJ Expected post-operative ABLA Expected post-operative consumptive thrombocytopenia  HTN HLD Anxiety on chronic benzodiazepine  P:  - post-op management  per TCTS - rapid wean per TCTS protocol - VAP/ PPI - CXR/ ABG, CBC, BMET, coags now and trend> ABG reviewed> will transfuse 1u PRBC and w/ developing oozing from CVL and CT sites will transfuse plts and FFP and check fibrinogen  - con't weaning pressors/ prn cleviprex  for MAP >70-90. Albumin  boluses prn for low SV.  - tele monitoring/ pacing prn.  Baseline reported HR is SB 40-50's preop - mediastinal drains per TCTS - complete TXA infusion - insulin  gtt per endotool - ASA, statin to resume next day  - metoprolol  BID once off pressors - complete  post-op antibiotics - monitor electrolytes, replete PRN - multimodal pain control per protocol- oxycodone , tramadol , morphine  with bowel regimen  Best Practice (right click and Reselect all SmartList Selections daily)   Diet/type: NPO DVT prophylaxis SCD Pressure ulcer(s): pressure ulcer assessment deferred  GI prophylaxis: PPI Lines: Central line, Arterial Line, and yes and it is still needed Foley:  Yes, and it is still needed Code Status:  full code Last date of multidisciplinary goals of care discussion [per primary team]  Labs   CBC: Recent Labs  Lab 01/31/24 1131 02/01/24 0806 02/01/24 1114 02/01/24 1120 02/01/24 1206 02/01/24 1229 02/01/24 1251 02/01/24 1318 02/01/24 1321  WBC 7.5  --   --   --   --   --   --   --   --   HGB 14.0   < > 7.7*   < > 6.8* 7.1* 6.8* 6.8* 6.8*  HCT 43.3   < > 23.8*   < > 20.0* 21.0* 20.6* 20.0* 20.0*  MCV 94.3  --   --   --   --   --   --   --   --   PLT 241  --  156  --   --   --  133*  --   --    < > = values in this interval not displayed.    Basic Metabolic Panel: Recent Labs  Lab 01/31/24 1131 02/01/24 0806 02/01/24 0959 02/01/24 1028 02/01/24 1057 02/01/24 1100 02/01/24 1148 02/01/24 1206 02/01/24 1229 02/01/24 1318 02/01/24 1321  NA 138   < > 136   < > 133*   < > 133* 134* 136 137 136  K 4.5   < > 4.1   < > 5.0   < > 4.9 5.5* 5.1 4.3 4.4  CL 101   < > 102  --  98  --  99  --  101  --  105  CO2 28  --   --   --   --   --   --   --   --   --   --   GLUCOSE 96   < > 123*  --  128*  --  127*  --  134*  --  153*  BUN 12   < > 12  --  10  --  10  --  9  --  9  CREATININE 0.83   < > 0.60  --  0.60  --  0.50  --  0.50  --  0.50  CALCIUM  9.9  --   --   --   --   --   --   --   --   --   --    < > = values in this interval not displayed.   GFR: Estimated Creatinine Clearance: 62.6 mL/min (by C-G formula based on SCr of 0.5  mg/dL). Recent Labs  Lab 01/31/24 1131  WBC 7.5    Liver Function Tests: Recent Labs  Lab  01/31/24 1131  AST 20  ALT 15  ALKPHOS 67  BILITOT 0.7  PROT 7.3  ALBUMIN  4.3   No results for input(s): LIPASE, AMYLASE in the last 168 hours. No results for input(s): AMMONIA in the last 168 hours.  ABG    Component Value Date/Time   PHART 7.415 02/01/2024 1318   PCO2ART 35.2 02/01/2024 1318   PO2ART 402 (H) 02/01/2024 1318   HCO3 22.6 02/01/2024 1318   TCO2 25 02/01/2024 1321   ACIDBASEDEF 2.0 02/01/2024 1318   O2SAT 100 02/01/2024 1318     Coagulation Profile: Recent Labs  Lab 01/31/24 1131  INR 0.9    Cardiac Enzymes: No results for input(s): CKTOTAL, CKMB, CKMBINDEX, TROPONINI in the last 168 hours.  HbA1C: Hgb A1c MFr Bld  Date/Time Value Ref Range Status  01/31/2024 11:31 AM 5.6 4.8 - 5.6 % Final    Comment:    (NOTE)         Prediabetes: 5.7 - 6.4         Diabetes: >6.4         Glycemic control for adults with diabetes: <7.0     CBG: No results for input(s): GLUCAP in the last 168 hours.  Review of Systems:   Unable given encephalopathy   Past Medical History:  She,  has a past medical history of Anxiety, Arthritis, Cancer (HCC), Complication of anesthesia, Difficult intubation, Fibromyalgia, Hypertension, MVP (mitral valve prolapse), Pneumonia (2023), and PONV (postoperative nausea and vomiting).   Surgical History:   Past Surgical History:  Procedure Laterality Date   ABDOMINAL HYSTERECTOMY     APPENDECTOMY     CATARACT EXTRACTION Bilateral    CERVICAL SPINE SURGERY     DILATION AND CURETTAGE OF UTERUS     x 2   FIRST RIB REMOVAL Right    HAND SURGERY Bilateral    KNEE ARTHROSCOPY W/ MENISCAL REPAIR Right    KNEE ARTHROSCOPY W/ MENISCAL REPAIR Left    LEFT HEART CATH AND CORONARY ANGIOGRAPHY N/A 01/15/2024   Procedure: LEFT HEART CATH AND CORONARY ANGIOGRAPHY;  Surgeon: Wonda Sharper, MD;  Location: [facility] INVASIVE CV LAB;  Service: Cardiovascular;  Laterality: N/A;   NEUROMA SURGERY Left    POSTERIOR CERVICAL  FUSION/FORAMINOTOMY N/A 02/16/2017   Procedure: CERVICAL 1- CERVICAL 2 POSTERIOR INSTRUMENTATION AND FUSION;  Surgeon: Mavis Purchase, MD;  Location: [facility] OR;  Service: Neurosurgery;  Laterality: N/A;  CERVICAL 1- CERVICAL 2 POSTERIOR INSTRUMENTATION AND FUSION   SQUAMOUS CELL CARCINOMA EXCISION  2023   2 on right leg and 1 on nose.   TMJ ARTHROPLASTY       Social History:   reports that she has never smoked. She has never used smokeless tobacco. She reports that she does not currently use alcohol after a past usage of about 3.0 standard drinks of alcohol per week. She reports that she does not use drugs.   Family History:  Her family history is not on file.   Allergies No Known Allergies   Home Medications  Prior to Admission medications   Medication Sig Start Date End Date Taking? Authorizing Provider  ALPRAZolam  (XANAX ) 0.5 MG tablet Take 0.5 mg by mouth 3 (three) times daily as needed for anxiety. 05/14/21  Yes [provider]  escitalopram  (LEXAPRO ) 10 MG tablet Take 10 mg by mouth daily. 11/23/23  Yes [provider]  hydrochlorothiazide (HYDRODIURIL)  25 MG tablet Take 25 mg by mouth daily. 11/23/23  Yes [provider]  isosorbide  mononitrate (IMDUR ) 30 MG 24 hr tablet Take 1 tablet (30 mg total) by mouth daily. 01/04/24  Yes Goodrich, Callie E, PA-C  metoprolol  succinate (TOPROL -XL) 25 MG 24 hr tablet Take 1 tablet (25 mg total) by mouth daily. 12/15/23  Yes Goodrich, Callie E, PA-C  rosuvastatin  (CRESTOR ) 40 MG tablet Take 1 tablet (40 mg total) by mouth daily. 12/26/23  Yes Jadine, Callie E, PA-C  telmisartan  (MICARDIS ) 80 MG tablet Take 1 tablet (80 mg total) by mouth daily. 12/12/23  Yes Goodrich, Callie E, PA-C  traMADol  (ULTRAM ) 50 MG tablet Take 50 mg by mouth 4 (four) times daily as needed for moderate pain (pain score 4-6) or severe pain (pain score 7-10). 10/20/23  Yes [provider]  aspirin  EC 81 MG tablet Take 1 tablet (81 mg total) by  mouth daily. Swallow whole. 12/26/23   Goodrich, Callie E, PA-C  nitroGLYCERIN  (NITROSTAT ) 0.4 MG SL tablet Place 1 tablet (0.4 mg total) under the tongue every 5 (five) minutes as needed for chest pain. 01/04/24   Goodrich, Callie E, PA-C  sulfamethoxazole -trimethoprim  (BACTRIM  DS) 800-160 MG tablet Take 1 tablet by mouth 2 (two) times daily. 01/09/24   [provider]     Critical care time: 45 mins       Lyle Pesa, MSN, AG-ACNP-BC [facility] 02/01/2024, 3:07 PM  See Amion for pager If no response to pager , please call [phone] until 7pm After 7:00 pm call Elink  [phone]

## 2024-02-02 ENCOUNTER — Encounter: Admitting: Thoracic Surgery (Cardiothoracic Vascular Surgery)

## 2024-02-02 ENCOUNTER — Other Ambulatory Visit: Payer: Self-pay

## 2024-02-02 ENCOUNTER — Inpatient Hospital Stay (HOSPITAL_COMMUNITY)

## 2024-02-02 ENCOUNTER — Encounter (HOSPITAL_COMMUNITY): Payer: Self-pay | Admitting: Thoracic Surgery (Cardiothoracic Vascular Surgery)

## 2024-02-02 DIAGNOSIS — Z951 Presence of aortocoronary bypass graft: Secondary | ICD-10-CM | POA: Diagnosis not present

## 2024-02-02 LAB — CBC
HCT: 25 % — ABNORMAL LOW (ref 36.0–46.0)
HCT: 25.4 % — ABNORMAL LOW (ref 36.0–46.0)
Hemoglobin: 8 g/dL — ABNORMAL LOW (ref 12.0–15.0)
Hemoglobin: 8.1 g/dL — ABNORMAL LOW (ref 12.0–15.0)
MCH: 30.1 pg (ref 26.0–34.0)
MCH: 30.6 pg (ref 26.0–34.0)
MCHC: 32 g/dL (ref 30.0–36.0)
MCHC: 31.9 g/dL (ref 30.0–36.0)
MCV: 94 fL (ref 80.0–100.0)
MCV: 95.8 fL (ref 80.0–100.0)
Platelets: 124 K/uL — ABNORMAL LOW (ref 150–400)
Platelets: 125 K/uL — ABNORMAL LOW (ref 150–400)
RBC: 2.66 MIL/uL — ABNORMAL LOW (ref 3.87–5.11)
RBC: 2.65 MIL/uL — ABNORMAL LOW (ref 3.87–5.11)
RDW: 13.8 % (ref 11.5–15.5)
RDW: 14.3 % (ref 11.5–15.5)
WBC: 11.6 K/uL — ABNORMAL HIGH (ref 4.0–10.5)
WBC: 13.8 K/uL — ABNORMAL HIGH (ref 4.0–10.5)
nRBC: 0 % (ref 0.0–0.2)
nRBC: 0 % (ref 0.0–0.2)

## 2024-02-02 LAB — POCT I-STAT 7, (LYTES, BLD GAS, ICA,H+H)
Acid-base deficit: 4 mmol/L — ABNORMAL HIGH (ref 0.0–2.0)
Bicarbonate: 22.2 mmol/L (ref 20.0–28.0)
Calcium, Ion: 1.18 mmol/L (ref 1.15–1.40)
HCT: 21 % — ABNORMAL LOW (ref 36.0–46.0)
Hemoglobin: 7.1 g/dL — ABNORMAL LOW (ref 12.0–15.0)
O2 Saturation: 96 %
Patient temperature: 37.5
Potassium: 3.9 mmol/L (ref 3.5–5.1)
Sodium: 143 mmol/L (ref 135–145)
TCO2: 24 mmol/L (ref 22–32)
pCO2 arterial: 45.2 mmHg (ref 32–48)
pH, Arterial: 7.302 — ABNORMAL LOW (ref 7.35–7.45)
pO2, Arterial: 97 mmHg (ref 83–108)

## 2024-02-02 LAB — BASIC METABOLIC PANEL WITH GFR
Anion gap: 6 (ref 5–15)
Anion gap: 8 (ref 5–15)
BUN: 12 mg/dL (ref 8–23)
BUN: 16 mg/dL (ref 8–23)
CO2: 23 mmol/L (ref 22–32)
CO2: 23 mmol/L (ref 22–32)
Calcium: 8 mg/dL — ABNORMAL LOW (ref 8.9–10.3)
Calcium: 8.6 mg/dL — ABNORMAL LOW (ref 8.9–10.3)
Chloride: 110 mmol/L (ref 98–111)
Chloride: 106 mmol/L (ref 98–111)
Creatinine, Ser: 0.66 mg/dL (ref 0.44–1.00)
Creatinine, Ser: 0.86 mg/dL (ref 0.44–1.00)
GFR, Estimated: >60 mL/min (ref >60)
GFR, Estimated: >60 mL/min (ref >60)
Glucose, Bld: 121 mg/dL — ABNORMAL HIGH (ref 70–99)
Glucose, Bld: 151 mg/dL — ABNORMAL HIGH (ref 70–99)
Potassium: 3.9 mmol/L (ref 3.5–5.1)
Potassium: 4.3 mmol/L (ref 3.5–5.1)
Sodium: 139 mmol/L (ref 135–145)
Sodium: 137 mmol/L (ref 135–145)

## 2024-02-02 LAB — PREPARE PLATELET PHERESIS: Unit division: 0

## 2024-02-02 LAB — BPAM FFP
Blood Product Expiration Date: 202507142359
ISSUE DATE / TIME: 202507101521
Unit Type and Rh: 6200

## 2024-02-02 LAB — TYPE AND SCREEN
ABO/RH(D): A POS
Antibody Screen: NEGATIVE
Unit division: 0

## 2024-02-02 LAB — HEMOGLOBIN AND HEMATOCRIT, BLOOD
HCT: 26.4 % — ABNORMAL LOW (ref 36.0–46.0)
Hemoglobin: 8.4 g/dL — ABNORMAL LOW (ref 12.0–15.0)

## 2024-02-02 LAB — GLUCOSE, CAPILLARY
Glucose-Capillary: 132 mg/dL — ABNORMAL HIGH (ref 70–99)
Glucose-Capillary: 125 mg/dL — ABNORMAL HIGH (ref 70–99)
Glucose-Capillary: 126 mg/dL — ABNORMAL HIGH (ref 70–99)
Glucose-Capillary: 141 mg/dL — ABNORMAL HIGH (ref 70–99)
Glucose-Capillary: 116 mg/dL — ABNORMAL HIGH (ref 70–99)
Glucose-Capillary: 145 mg/dL — ABNORMAL HIGH (ref 70–99)
Glucose-Capillary: 146 mg/dL — ABNORMAL HIGH (ref 70–99)
Glucose-Capillary: 136 mg/dL — ABNORMAL HIGH (ref 70–99)
Glucose-Capillary: 138 mg/dL — ABNORMAL HIGH (ref 70–99)

## 2024-02-02 LAB — PREPARE FRESH FROZEN PLASMA

## 2024-02-02 LAB — BPAM PLATELET PHERESIS
Blood Product Expiration Date: 202507122359
ISSUE DATE / TIME: 202507101521
Unit Type and Rh: 6200

## 2024-02-02 LAB — BPAM RBC
Blood Product Expiration Date: 202508092359
ISSUE DATE / TIME: 202507101521
Unit Type and Rh: 6200

## 2024-02-02 LAB — POTASSIUM: Potassium: 3.9 mmol/L (ref 3.5–5.1)

## 2024-02-02 LAB — MAGNESIUM
Magnesium: 2.8 mg/dL — ABNORMAL HIGH (ref 1.7–2.4)
Magnesium: 2.8 mg/dL — ABNORMAL HIGH (ref 1.7–2.4)

## 2024-02-02 MED ORDER — ASPIRIN 81 MG PO TBEC
81.0000 mg | DELAYED_RELEASE_TABLET | Freq: Every day | ORAL | Status: DC
  Administered 2024-02-03 – 2024-02-09 (×7): 81 mg via ORAL
  Filled 2024-02-02 (×7): qty 1

## 2024-02-02 MED ORDER — CLOPIDOGREL BISULFATE 75 MG PO TABS
75.0000 mg | ORAL_TABLET | Freq: Every day | ORAL | Status: DC
  Administered 2024-02-02 – 2024-02-09 (×8): 75 mg via ORAL
  Filled 2024-02-02 (×8): qty 1

## 2024-02-02 MED ORDER — ALBUMIN HUMAN 25 % IV SOLN
25.0000 g | Freq: Four times a day (QID) | INTRAVENOUS | Status: AC
  Administered 2024-02-02: 12.5 g via INTRAVENOUS
  Administered 2024-02-02 – 2024-02-03 (×3): 25 g via INTRAVENOUS
  Filled 2024-02-02 (×4): qty 100

## 2024-02-02 MED ORDER — ENOXAPARIN SODIUM 30 MG/0.3ML IJ SOSY
30.0000 mg | PREFILLED_SYRINGE | Freq: Every day | INTRAMUSCULAR | Status: DC
  Administered 2024-02-02 – 2024-02-08 (×7): 30 mg via SUBCUTANEOUS
  Filled 2024-02-02 (×7): qty 0.3

## 2024-02-02 MED ORDER — INSULIN ASPART 100 UNIT/ML IJ SOLN
0.0000 [IU] | INTRAMUSCULAR | Status: DC
  Administered 2024-02-02 – 2024-02-03 (×5): 2 [IU] via SUBCUTANEOUS

## 2024-02-02 MED ORDER — ASPIRIN 81 MG PO CHEW
81.0000 mg | CHEWABLE_TABLET | Freq: Every day | ORAL | Status: DC
  Filled 2024-02-02 (×2): qty 1

## 2024-02-02 NOTE — Progress Notes (Signed)
      [address]       [place] [address]             [phone]      POD # 1 CABG  Resting in bed  BP (!) 116/55   Pulse 65   Temp (!) 100.4 F (38 C)   Resp 14   Ht 5' 6 (1.676 m)   Wt 81.9 kg   SpO2 90%   BMI 29.14 kg/m    Intake/Output Summary (Last 24 hours) at 02/02/2024 1739 Last data filed at 02/02/2024 1600 Gross per 24 hour  Intake 1398.38 ml  Output 1789 ml  Net -390.62 ml   K 4.3, Hct 25  Doing well POD # 1  Cieara Stierwalt C. Kerrin, MD [facility] [phone]

## 2024-02-02 NOTE — Progress Notes (Signed)
   NAME:  Christine Hart, MRN:  [id number], DOB:  February 01, 1946, LOS: 1 ADMISSION DATE:  02/01/2024, CONSULTATION DATE:  02/01/24 REFERRING MD:  Dr. Shyrl, CHIEF COMPLAINT:  post op CABG   History of Present Illness:  Pt encephalopathic, therefore HPI obtained from EMR review.   24 yoF with PMH significant for CAD, mitral valve prolapse, SVT, HTN, HLD, chronic back pain, arthritis, and anxiety with reported several month hx of fatigue with occasional chest tightness and pressure found to have multivessel CAD on [facility] 6/23.  TTE 6/26 with LVEF 70-75%, trivial MR.  Pt underwent CABG x 3> LIMA -LAD, SVG to OM, and SVG to diagonal on 7/10 by Dr. Shyrl.  Noted by anesthesia to be difficult airway due to prior cervical spine surgeries and hx of TMJ.  Intra op, LAD noted to be heavily calcified with initial anastomosis not hemostatic after removing cross clamp requiring aortic reclamping, endarterectomy and LIMA connected with resultant hemostasis and transition off CPB.  PCCM consulted for vent and post op medical management in ICU.   Pertinent  Medical History   Past Medical History:  Diagnosis Date   Anxiety    Arthritis    Cancer (HCC)    basal and squamous cell carcinoma. Lesion excisions in 2023.   Complication of anesthesia    Limited mouth opening and neck mobility, s/p TMJ surgeries    Difficult intubation    Fibromyalgia    Hypertension    MVP (mitral valve prolapse)    Pneumonia 2023   Hospitalized for 2 nights, [place].   PONV (postoperative nausea and vomiting)    Significant Hospital Events: Including procedures, antibiotic start and stop dates in addition to other pertinent events   7/10 CABG x 3  Interim History / Subjective:   In good spirits Pulling 1000 with IS Sugars ok  Objective    Blood pressure (!) 93/50, pulse 64, temperature 99.7 F (37.6 C), resp. rate 14, height 5' 6 (1.676 m), weight 81.9 kg, SpO2 96%. CVP:  [1 mmHg-19 mmHg] 8 mmHg CO:  [3  L/min-5.7 L/min] 4.6 L/min CI:  [1.6 L/min/m2-3.1 L/min/m2] 2.5 L/min/m2  Vent Mode: PSV;CPAP FiO2 (%):  [40 %-50 %] 40 % Set Rate:  [4 bmp-16 bmp] 4 bmp Vt Set:  [470 mL] 470 mL PEEP:  [5 cmH20] 5 cmH20 Pressure Support:  [10 cmH20] 10 cmH20 Plateau Pressure:  [15 cmH20-18 cmH20] 15 cmH20   Intake/Output Summary (Last 24 hours) at 02/02/2024 0813 Last data filed at 02/02/2024 0800 Gross per 24 hour  Intake 5956.69 ml  Output 3495 ml  Net 2461.69 ml   Filed Weights   02/01/24 0545 02/02/24 0602  Weight: 76.7 kg 81.9 kg   Examination: No distress Chest tube output looks serosanguinous Moves to command RASS 0 Lungs clear with some crackles at bases, nonlabored breathing pattern Ext warm  ABG mildly acidemic this am CBC, BMP ok CXR scattered atelectasis  Resolved problem list   Assessment and Plan   Multivessel CAD s/p CABG x3, LIMA -LAD, SVG-OM, SVG-Diagonal Post bypass vasoplegia- improved HTN HLD Postop glucose control  - Arterial line out - Transition to SSI - Encourage IS, OOB - Giving some albumin  - Pacer wires out then starting plavix  - Will follow while in ICU  Rolan Sharps MD PCCM

## 2024-02-02 NOTE — Plan of Care (Signed)
  Problem: Education: Goal: Knowledge of General Education information will improve Description: Including pain rating scale, medication(s)/side effects and non-pharmacologic comfort measures Outcome: Progressing   Problem: Health Behavior/Discharge Planning: Goal: Ability to manage health-related needs will improve Outcome: Progressing   Problem: Clinical Measurements: Goal: Ability to maintain clinical measurements within normal limits will improve Outcome: Progressing Goal: Will remain free from infection Outcome: Progressing Goal: Diagnostic test results will improve Outcome: Progressing Goal: Respiratory complications will improve Outcome: Progressing Goal: Cardiovascular complication will be avoided Outcome: Progressing   Problem: Clinical Measurements: Goal: Will remain free from infection Outcome: Progressing   Problem: Activity: Goal: Risk for activity intolerance will decrease Outcome: Progressing   Problem: Nutrition: Goal: Adequate nutrition will be maintained Outcome: Progressing   Problem: Coping: Goal: Level of anxiety will decrease Outcome: Progressing   Problem: Pain Managment: Goal: General experience of comfort will improve and/or be controlled Outcome: Progressing   Problem: Safety: Goal: Ability to remain free from injury will improve Outcome: Progressing   Problem: Skin Integrity: Goal: Risk for impaired skin integrity will decrease Outcome: Progressing   Problem: Education: Goal: Will demonstrate proper wound care and an understanding of methods to prevent future damage Outcome: Progressing Goal: Knowledge of disease or condition will improve Outcome: Progressing Goal: Knowledge of the prescribed therapeutic regimen will improve Outcome: Progressing Goal: Individualized Educational Video(s) Outcome: Progressing   Problem: Activity: Goal: Risk for activity intolerance will decrease Outcome: Progressing   Problem: Cardiac: Goal:  Will achieve and/or maintain hemodynamic stability Outcome: Progressing   Problem: Clinical Measurements: Goal: Postoperative complications will be avoided or minimized Outcome: Progressing   Problem: Respiratory: Goal: Respiratory status will improve Outcome: Progressing   Problem: Urinary Elimination: Goal: Ability to achieve and maintain adequate renal perfusion and functioning will improve Outcome: Progressing   Problem: Education: Goal: Will demonstrate proper wound care and an understanding of methods to prevent future damage Outcome: Progressing Goal: Knowledge of disease or condition will improve Outcome: Progressing Goal: Knowledge of the prescribed therapeutic regimen will improve Outcome: Progressing Goal: Individualized Educational Video(s) Outcome: Progressing

## 2024-02-02 NOTE — Discharge Summary (Addendum)
 [address] [place] [address]             [phone]        Physician Discharge Summary  Patient ID: JAIDYNN BALSTER MRN: [id number] DOB/AGE: 76/13/1947 76 y.o.  Admit date: 02/01/2024 Discharge date: 02/09/2024  Admission Diagnoses:  Patient Active Problem List   Diagnosis Date Noted   CAD (coronary artery disease) 01/19/2024   Spondylolisthesis of lumbar region 12/07/2022   Sepsis (HCC) 10/29/2021   Anxiety 10/29/2021   Hyponatremia 10/29/2021   Influenza A 05/26/2021   Hypertension    Acute respiratory failure with hypoxia (HCC)    Arthropathy of cervical facet joint 02/16/2017     Discharge Diagnoses:  Patient Active Problem List   Diagnosis Date Noted   S/P CABG x 3 02/01/2024   CAD (coronary artery disease) 01/19/2024   Spondylolisthesis of lumbar region 12/07/2022   Sepsis (HCC) 10/29/2021   Anxiety 10/29/2021   Hyponatremia 10/29/2021   Influenza A 05/26/2021   Hypertension    Acute respiratory failure with hypoxia (HCC)    Arthropathy of cervical facet joint Postoperative paroxysmal atrial fibrillation Left pleural effusion  02/16/2017     Discharged Condition: stable  History of Present Illness:     Trinia Georgi is a 78 y.o. female with a past medical history of CAD, mitral valve prolapse, SVT, hypertension, hyperlipidemia, arthritis, chronic back pain, and anxiety. She presents for surgical evaluation of multivessel CAD.  She has been symptomatic with occasional chest tightness, and pressure. This occur when she is anxious or sleeping. She also reports some fatigue that has persisted for several months. Cardiac catheterization on 06/23 showed severe diffuse LAD stenosis, severe mid circumflex and first obtuse marginal stenoses and moderate to severe proximal and mid RCA stenoses. Echocardiogram 06/26 showed LVEF 70-75%, tricial mitral valve regurgitation, aortic valve sclerosis without stenosis.   Dr. Shyrl  reviewed the patient's diagnostic studies and determined she would benefit from surgical intervention. He reviewed the treatment options as well as the risks and benefits of surgery with the patient. Ms. Duran was agreeable to proceed with surgery.  Hospital Course: Ms. Arechiga presented to [facility] and was brought to the operating room on 02/01/24. She underwent CABG x 3 utilizing LIMA to LAD, SVG to OM and SVG to Diagonal as well as endoscopic harvest of the greater saphenous vein. She tolerated the procedure well and was transferred to the SICU in stable condition. Drips were weaned as hemodynamics tolerated. Swan ganz catheter and arterial lines were removed without complication. Epicardial pacing wires were removed on POD1 without complication. She was started on Plavix  for endarterectomy. She had post bypass vasoplegia that improved with albumin .  By the second postoperative day, blood pressure was stable allowing for gentle diuresis.  She was tolerating diet without difficulty.  Was mobilized in the unit.  Chest tubes were removed on postop day 2. A follow up Echo was obtained on 02/06/24 showing LV EF had decreased to 45-50% with apical hypokinesis.  This was not unexpected due to complex LAD disease with an un-graftable segment in the distal LAD.    She was ready for transfer to floor at the [facility] on Postop Day 3.  She progressed slowly with mobility.  She had recurrent atrial fibrillation after transfer to [facility].  She was treated with additional IV amiodarone  as a bolus and was also started on oral diltiazem  30  mg every 6 hours.  She converted back to sinus rhythm and remained in stable sinus rhythm for several hours but then had recurrent atrial fibrillation in the early morning hours of 02/08/2024.  Hypokalemia was corrected.  Additional IV amiodarone  was administered and she remained in SR for the next 24 hours. She was evaluated by PT and OT and was not felt to  require any additional outpatient therapy for mobility. She has been using a walker for balance and said she already has one at home.  A follow up CXR on 7/17 showed a small to moderate left pleural effusion. She did not seem to be symptomatic from this so we elected to continue managing this with diuretics and follow up in the outpatient setting.  Mrs. Faiella was ready to discharge to home on post-op day 8.   Consults: pulmonary/intensive care  Significant Diagnostic Studies:  LEFT HEART CATH AND CORONARY ANGIOGRAPHY   1.  Patent left main with no significant stenosis 2.  Severe diffuse LAD stenosis with multiple lesions in the proximal, mid, and apical portions.  The LAD wraps around the apex and supplies much of the inferior wall. 3.  Severe mid circumflex and first obtuse marginal stenoses 4.  Moderate to severe proximal and mid RCA stenoses 5.  Vigorous LV systolic function with LVEF greater than 65% and no regional wall motion abnormalities, moderately elevated LVEDP    ECHOCARDIOGRAM REPORT    Patient Name:   CHARNICE ZWILLING Date of Exam: 01/18/2024  Medical Rec #:  [id number]      Height:       66.0 in  Accession #:    [id number]     Weight:       170.0 lb  Date of Birth:  March 30, 1946      BSA:          1.866 m  Patient Age:    78 years       BP:           124/78 mmHg  Patient Gender: F              HR:           61 bpm.  Exam Location:  [address]   Procedure: 2D Echo, Cardiac Doppler, Color Doppler and Strain Analysis  (Both            Spectral and Color Flow Doppler were utilized during  procedure).   Indications:   R07.9 Chest pain    History:        Patient has no prior history of Echocardiogram  examinations.                 Mitral Valve Prolapse; Risk Factors:Hypertension.    Sonographer:    Carl Coma RDCS  Referring Phys: [id number] CALLIE E GOODRICH   IMPRESSIONS     1. Left ventricular ejection fraction, by estimation, is 70 to 75%. The  left  ventricle has hyperdynamic function. The left ventricle has no  regional wall motion abnormalities. Left ventricular diastolic parameters  were normal. The average left  ventricular global longitudinal strain is -27.2 %. The global longitudinal  strain is normal.   2. Right ventricular systolic function is normal. The right ventricular  size is normal.   3. The mitral valve is normal in structure. Trivial mitral valve  regurgitation. No evidence of mitral stenosis.   4. The aortic valve is tricuspid. There is mild calcification of the  aortic valve. Aortic valve  regurgitation is not visualized. Aortic valve  sclerosis/calcification is present, without any evidence of aortic  stenosis.   5. The inferior vena cava is normal in size with greater than 50%  respiratory variability, suggesting right atrial pressure of 3 mmHg.   FINDINGS   Left Ventricle: Left ventricular ejection fraction, by estimation, is 70  to 75%. The left ventricle has hyperdynamic function. The left ventricle  has no regional wall motion abnormalities. The average left ventricular  global longitudinal strain is -27.2   %. Strain was performed and the global longitudinal strain is normal. The  left ventricular internal cavity size was normal in size. There is no left  ventricular hypertrophy. Left ventricular diastolic parameters were  normal.   Right Ventricle: The right ventricular size is normal. No increase in  right ventricular wall thickness. Right ventricular systolic function is  normal.   Left Atrium: Left atrial size was normal in size.   Right Atrium: Right atrial size was normal in size.   Pericardium: There is no evidence of pericardial effusion.   Mitral Valve: The mitral valve is normal in structure. Trivial mitral  valve regurgitation. No evidence of mitral valve stenosis.   Tricuspid Valve: The tricuspid valve is normal in structure. Tricuspid  valve regurgitation is mild . No evidence of  tricuspid stenosis.   Aortic Valve: The aortic valve is tricuspid. There is mild calcification  of the aortic valve. Aortic valve regurgitation is not visualized. Aortic  valve sclerosis/calcification is present, without any evidence of aortic  stenosis.   Pulmonic Valve: The pulmonic valve was normal in structure. Pulmonic valve  regurgitation is trivial. No evidence of pulmonic stenosis.   Aorta: The aortic root is normal in size and structure.   Venous: The inferior vena cava is normal in size with greater than 50%  respiratory variability, suggesting right atrial pressure of 3 mmHg.   IAS/Shunts: No atrial level shunt detected by color flow Doppler.     LEFT VENTRICLE  PLAX 2D  LVIDd:         4.60 cm   Diastology  LVIDs:         2.40 cm   LV e' medial:    7.24 cm/s  LV PW:         0.80 cm   LV E/e' medial:  13.8  LV IVS:        0.80 cm   LV e' lateral:   8.48 cm/s  LVOT diam:     1.90 cm   LV E/e' lateral: 11.8  LV SV:         75  LV SV Index:   40        2D Longitudinal Strain  LVOT Area:     2.84 cm  2D Strain GLS (A4C):   -27.5 %                           2D Strain GLS (A3C):   -27.9 %                           2D Strain GLS (A2C):   -26.1 %                           2D Strain GLS Avg:     -27.2 %   RIGHT VENTRICLE  IVC  RV Basal diam:  3.30 cm     IVC diam: 0.90 cm  RV S prime:     14.00 cm/s  TAPSE (M-mode): 2.6 cm   LEFT ATRIUM             Index        RIGHT ATRIUM           Index  LA diam:        3.60 cm 1.93 cm/m   RA Area:     10.80 cm  LA Vol (A2C):   33.6 ml 18.00 ml/m  RA Volume:   26.20 ml  14.04 ml/m  LA Vol (A4C):   30.5 ml 16.34 ml/m  LA Biplane Vol: 33.2 ml 17.79 ml/m   AORTIC VALVE  LVOT Vmax:   121.50 cm/s  LVOT Vmean:  78.200 cm/s  LVOT VTI:    0.264 m    AORTA  Ao Root diam: 3.20 cm  Ao Asc diam:  3.30 cm   MITRAL VALVE                TRICUSPID VALVE  MV Area (PHT): 3.88 cm     TR Peak grad:   28.5 mmHg  MV Decel Time:  196 msec     TR Vmax:        267.00 cm/s  MV E velocity: 100.00 cm/s  MV A velocity: 82.30 cm/s   SHUNTS  MV E/A ratio:  1.22         Systemic VTI:  0.26 m                              Systemic Diam: 1.90 cm   Toribio Fuel MD  Electronically signed by Toribio Fuel MD  Signature Date/Time: 01/18/2024/2:57:10 PM      Final     Treatments: surgery: 02/01/2024 Patient:  Merlynn KATHEE Hummer Pre-Op Dx: 3V CAD HTN HLP    Post-op Dx:  same Procedure: CABG X 3.  LIMA LAD, RSVG OM, diagonal LAD endarterectomy   Endoscopic greater saphenous vein harvest on the right     Surgeon and Role:      * Lightfoot, Linnie KIDD, MD - Primary    * B. Raguel , PA-C - assisting An experienced assistant was required given the complexity of this surgery and the standard of surgical care. The assistant was needed for exposure, dissection, suctioning, retraction of delicate tissues and sutures, instrument exchange and for overall help during this procedure.    Discharge Exam: Blood pressure 123/65, pulse 80, temperature 97.6 F (36.4 C), temperature source Oral, resp. rate 19, height 5' 6 (1.676 m), weight 77.7 kg, SpO2 94%.  General appearance: alert, cooperative, and no distress Neurologic: intact Heart: NSR, no further atrial fibrillation since early yesterday.    Lungs: normal work of breathing on RA. Breath sounds are clear. Abdomen: soft, no tenderness Extremities: peripheral edema has resolved. The RLE EVH incision is intact and dry, expected bruising right thigh Wound: The sternotomy incision is well approximated and dry.    Discharge Medications:  The patient has been discharged on:   1.Beta Blocker:  Yes [  X ]                              No   [   ]  If No, reason:  2.Ace Inhibitor/ARB: Yes [ x  ]                                     No  [    ]                                     If No, reason:  3.Statin:   Yes [  X ]                  No  [   ]                   If No, reason:  4.Ecasa:  Yes  [  X ]                  No   [   ]                  If No, reason:  Patient had ACS upon admission: No  Plavix /P2Y12 inhibitor: Yes [ X  ]                                      No  [   ]     Discharge Instructions     AMB referral to cardiac rehabilitation   Complete by: As directed    Diagnosis: CABG   CABG X ___: 3   After initial evaluation and assessments completed: Virtual Based Care may be provided alone or in conjunction with Phase 2 Cardiac Rehab based on patient barriers.: Yes   Intensive Cardiac Rehabilitation (ICR) [facility] location only OR Traditional Cardiac Rehabilitation (TCR) *If criteria for ICR are not met will enroll in TCR ([facility] only): Yes      Allergies as of 02/09/2024   No Known Allergies      Medication List     STOP taking these medications    isosorbide  mononitrate 30 MG 24 hr tablet Commonly known as: IMDUR    nitroGLYCERIN  0.4 MG SL tablet Commonly known as: NITROSTAT    sulfamethoxazole -trimethoprim  800-160 MG tablet Commonly known as: BACTRIM  DS       TAKE these medications    ALPRAZolam  0.5 MG tablet Commonly known as: XANAX  Take 0.5 mg by mouth 3 (three) times daily as needed for anxiety.   amiodarone  200 MG tablet Commonly known as: PACERONE  Take 2 tablets (400 mg total) by mouth 2 (two) times daily. For 1 week then reduce the dose to 1 tablet twice daily for 2 weeks then reduce the dose to 1 tablet daily.   aspirin  EC 81 MG tablet Take 1 tablet (81 mg total) by mouth daily. Swallow whole.   clopidogrel  75 MG tablet Commonly known as: PLAVIX  Take 1 tablet (75 mg total) by mouth daily.   escitalopram  10 MG tablet Commonly known as: LEXAPRO  Take 10 mg by mouth daily.   hydrochlorothiazide 25 MG tablet Commonly known as: HYDRODIURIL Take 25 mg by mouth daily.   metoprolol  succinate 50 MG 24 hr tablet Commonly known as: TOPROL -XL Take 1 tablet (50 mg total) by mouth daily. Take  with or immediately following a meal. What changed:  medication strength how much to take additional instructions  potassium chloride  SA 20 MEQ tablet Commonly known as: KLOR-CON  M Take 1 tablet (20 mEq total) by mouth daily.   rosuvastatin  40 MG tablet Commonly known as: CRESTOR  Take 1 tablet (40 mg total) by mouth daily.   telmisartan  80 MG tablet Commonly known as: MICARDIS  Take 1 tablet (80 mg total) by mouth daily.   traMADol  50 MG tablet Commonly known as: ULTRAM  Take 1 tablet (50 mg total) by mouth every 6 (six) hours as needed for up to 7 days for moderate pain (pain score 4-6). What changed:  when to take this reasons to take this        Follow-up Information     Shyrl Linnie KIDD, MD. Go on 02/23/2024.   Specialty: Cardiothoracic Surgery Why: Virtual appointment is at 2:10 PM. Please do NOT go to the office as this is a VIRTUAL appointment, Dr. Shyrl will call you. Contact information: [address] [place] [address] [phone]         Goodrich, Callie E, PA-C Follow up on 02/22/2024.   Specialty: Cardiology Why: Cardiology follow up is at 10:55AM Contact information: [address] [place] [address] [phone]                 Signed:  Laurel JUDITHANN Becket, PA-C  02/09/2024, 8:18 AM

## 2024-02-02 NOTE — TOC Initial Note (Addendum)
 Transition of Care [facility]) - Initial/Assessment Note    Patient Details  Name: Christine Hart MRN: [id number] Date of Birth: July 19, 1946  [facility] CM/SW Contact:    Sudie Erminio Deems, RN Phone Number: 02/02/2024, 4:14 PM  Clinical Narrative:  Patient presented for history of severe CAD-POD 1 CABG. PTA patient was independent from home with spouse-hx of chronic back pain. Patient has insurance and PCP. [facility] liaison -following patient with TCTS office protocol referral prearranged for [facility] needs if needed. Case Manager will continue to follow for disposition needs as the patient progresses.                 Expected Discharge Plan: Home w Home Health Services Barriers to Discharge: Continued Medical Work up   Patient Goals and CMS Choice Patient states their goals for this hospitalization and ongoing recovery are:: plans to return home once stable   Choice offered to / list presented to : NA      Expected Discharge Plan and Services In-house Referral: NA Discharge Planning Services: CM Consult Post Acute Care Choice: Home Health Living arrangements for the past 2 months: Single Family Home                   DME Agency: NA   Prior Living Arrangements/Services Living arrangements for the past 2 months: Single Family Home Lives with:: Spouse Patient language and need for interpreter reviewed:: Yes        Need for Family Participation in Patient Care: Yes (Comment) Care giver support system in place?: Yes (comment)   Criminal Activity/Legal Involvement Pertinent to Current Situation/Hospitalization: No - Comment as needed  Activities of Daily Living   ADL Screening (condition at time of admission) Independently performs ADLs?: Yes (appropriate for developmental age) Is the patient deaf or have difficulty hearing?: Yes Does the patient have difficulty seeing, even when wearing glasses/contacts?: No Does the patient have difficulty concentrating,  remembering, or making decisions?: No  Permission Sought/Granted Permission sought to share information with : Case Manager, Family Supports     Emotional Assessment Appearance:: Appears stated age       Alcohol / Substance Use: Not Applicable Psych Involvement: No (comment)  Admission diagnosis:  Coronary artery disease involving native coronary artery of native heart, unspecified whether angina present [I25.10] S/P CABG x 3 [Z95.1] Patient Active Problem List   Diagnosis Date Noted   S/P CABG x 3 02/01/2024   CAD (coronary artery disease) 01/19/2024   Spondylolisthesis of lumbar region 12/07/2022   Sepsis (HCC) 10/29/2021   Anxiety 10/29/2021   Hyponatremia 10/29/2021   Influenza A 05/26/2021   Hypertension    Acute respiratory failure with hypoxia (HCC)    Arthropathy of cervical facet joint 02/16/2017   PCP:  Street, Lonni HERO, MD Pharmacy:   [facility] [address] [place] - [address] [facility] [place] [address] Phone: [phone] Fax: [phone]     Social Drivers of Health (SDOH) Social History: SDOH Screenings   Food Insecurity: Low Risk  (10/17/2023)   Received from [facility]  Housing: Low Risk  (10/17/2023)   Received from [facility]  Transportation Needs: No Transportation Needs (10/17/2023)   Received from [facility]  Utilities: Low Risk  (10/17/2023)   Received from [facility]  Tobacco Use: Low Risk  (02/01/2024)   SDOH Interventions:     Readmission Risk Interventions     No data to display

## 2024-02-02 NOTE — Progress Notes (Signed)
      [address]       [name] [address]             [phone]                 1 Day Post-Op Procedure(s) (LRB): CORONARY ARTERY BYPASS GRAFTING X 3, USING LEFT INTERNAL MAMMARY ARTERY AND ENDOSCOPIC HARVESTED RIGHT SAPHENOUS VEIN, ENDARTERECTOMY LEFT ANTERIOR DESCENDING CORONARY ARTERY (N/A) ECHOCARDIOGRAM, TRANSESOPHAGEAL (N/A)   Events: No events.  Extubated overnight. _______________________________________________________________ Vitals: BP (!) 111/54 (BP Location: Right Arm)   Pulse 67   Temp 99.9 F (37.7 C) (Bladder)   Resp 14   Ht 5' 6 (1.676 m)   Wt 81.9 kg   SpO2 92%   BMI 29.14 kg/m  Filed Weights   02/01/24 0545 02/02/24 0602  Weight: 76.7 kg 81.9 kg     - Neuro: Alert NAD  - Cardiovascular: Sinus  Drips: None.   CVP:  [1 mmHg-19 mmHg] 11 mmHg CO:  [3 L/min-5.7 L/min] 4.3 L/min CI:  [1.6 L/min/m2-3.1 L/min/m2] 2.3 L/min/m2  - Pulm: Easy work of breathing  ABG    Component Value Date/Time   PHART 7.302 (L) 02/02/2024 0454   PCO2ART 45.2 02/02/2024 0454   PO2ART 97 02/02/2024 0454   HCO3 22.2 02/02/2024 0454   TCO2 24 02/02/2024 0454   ACIDBASEDEF 4.0 (H) 02/02/2024 0454   O2SAT 96 02/02/2024 0454    - Abd: Nondistended - Extremity: Warm  .Intake/Output      07/10 0701 07/11 0700 07/11 0701 07/12 0700   I.V. (mL/kg) 2132.4 (26) 82.3 (1)   Blood 1994.8    IV Piggyback 2079.5 33.5   Total Intake(mL/kg) 6206.7 (75.8) 115.9 (1.4)   Urine (mL/kg/hr) 2645 (1.3) 234 (0.7)   Chest Tube 630 110   Total Output 3275 344   Net +2931.7 -228.2           _______________________________________________________________ Labs:    Latest Ref Rng & Units 02/02/2024    4:54 AM 02/02/2024    4:28 AM 02/01/2024   11:54 PM  CBC  WBC 4.0 - 10.5 K/uL  11.6    Hemoglobin 12.0 - 15.0 g/dL 7.1  8.0  8.4   Hematocrit 36.0 - 46.0 % 21.0  25.0  26.4   Platelets 150 - 400 K/uL  124        Latest Ref Rng & Units 02/02/2024    4:54 AM  02/02/2024    4:28 AM 02/01/2024   11:54 PM  CMP  Glucose 70 - 99 mg/dL  878    BUN 8 - 23 mg/dL  12    Creatinine 9.55 - 1.00 mg/dL  9.33    Sodium 864 - 854 mmol/L 143  139    Potassium 3.5 - 5.1 mmol/L 3.9  3.9  3.9   Chloride 98 - 111 mmol/L  110    CO2 22 - 32 mmol/L  23    Calcium  8.9 - 10.3 mg/dL  8.0      CXR: Pulmonary vascular congestion  _______________________________________________________________  Assessment and Plan: POD 1 s/p CABG with LAD endarterectomy  Neuro: Pain control CV: Will start Plavix  for endarterectomy.  Will remove pacing wires and arterial line.  On aspirin  statin and beta-blocker. Pulm: Will keep chest tubes today.  Pulmonary hygiene. Renal: Creatinine stable. GI: On diet. Heme: Stable ID: Afebrile Endo: Sliding scale insulin  Dispo: Continue ICU care   Christine Hart 02/02/2024 11:23 AM

## 2024-02-03 ENCOUNTER — Inpatient Hospital Stay (HOSPITAL_COMMUNITY)

## 2024-02-03 LAB — BASIC METABOLIC PANEL WITH GFR
Anion gap: 8 (ref 5–15)
BUN: 19 mg/dL (ref 8–23)
CO2: 24 mmol/L (ref 22–32)
Calcium: 8.7 mg/dL — ABNORMAL LOW (ref 8.9–10.3)
Chloride: 106 mmol/L (ref 98–111)
Creatinine, Ser: 0.89 mg/dL (ref 0.44–1.00)
GFR, Estimated: >60 mL/min (ref >60)
Glucose, Bld: 123 mg/dL — ABNORMAL HIGH (ref 70–99)
Potassium: 4.1 mmol/L (ref 3.5–5.1)
Sodium: 138 mmol/L (ref 135–145)

## 2024-02-03 LAB — CBC
HCT: 24.7 % — ABNORMAL LOW (ref 36.0–46.0)
Hemoglobin: 7.7 g/dL — ABNORMAL LOW (ref 12.0–15.0)
MCH: 30 pg (ref 26.0–34.0)
MCHC: 31.2 g/dL (ref 30.0–36.0)
MCV: 96.1 fL (ref 80.0–100.0)
Platelets: 115 K/uL — ABNORMAL LOW (ref 150–400)
RBC: 2.57 MIL/uL — ABNORMAL LOW (ref 3.87–5.11)
RDW: 14 % (ref 11.5–15.5)
WBC: 10.9 K/uL — ABNORMAL HIGH (ref 4.0–10.5)
nRBC: 0 % (ref 0.0–0.2)

## 2024-02-03 LAB — GLUCOSE, CAPILLARY
Glucose-Capillary: 107 mg/dL — ABNORMAL HIGH (ref 70–99)
Glucose-Capillary: 132 mg/dL — ABNORMAL HIGH (ref 70–99)
Glucose-Capillary: 139 mg/dL — ABNORMAL HIGH (ref 70–99)
Glucose-Capillary: 97 mg/dL (ref 70–99)
Glucose-Capillary: 102 mg/dL — ABNORMAL HIGH (ref 70–99)

## 2024-02-03 MED ORDER — INSULIN ASPART 100 UNIT/ML IJ SOLN
0.0000 [IU] | Freq: Three times a day (TID) | INTRAMUSCULAR | Status: DC
  Administered 2024-02-03: 2 [IU] via SUBCUTANEOUS

## 2024-02-03 MED ORDER — POTASSIUM CHLORIDE CRYS ER 20 MEQ PO TBCR
20.0000 meq | EXTENDED_RELEASE_TABLET | Freq: Every day | ORAL | Status: DC
  Administered 2024-02-03 – 2024-02-06 (×4): 20 meq via ORAL
  Filled 2024-02-03 (×4): qty 1

## 2024-02-03 MED ORDER — ENSURE PLUS HIGH PROTEIN PO LIQD
237.0000 mL | Freq: Two times a day (BID) | ORAL | Status: DC
  Administered 2024-02-03 – 2024-02-09 (×8): 237 mL via ORAL

## 2024-02-03 MED ORDER — FUROSEMIDE 40 MG PO TABS
40.0000 mg | ORAL_TABLET | Freq: Every day | ORAL | Status: DC
  Administered 2024-02-03 – 2024-02-06 (×4): 40 mg via ORAL
  Filled 2024-02-03 (×4): qty 1

## 2024-02-03 NOTE — Progress Notes (Signed)
 02/03/2024  No events, improving.  Chest drains out today. Stable CXR.  Will follow peripherally over weekend unless issues.  Rolan Sharps MD PCCM

## 2024-02-03 NOTE — Plan of Care (Signed)
 Problem: Education: Goal: Knowledge of General Education information will improve Description: Including pain rating scale, medication(s)/side effects and non-pharmacologic comfort measures Outcome: Progressing   Problem: Health Behavior/Discharge Planning: Goal: Ability to manage health-related needs will improve Outcome: Progressing   Problem: Clinical Measurements: Goal: Ability to maintain clinical measurements within normal limits will improve Outcome: Progressing Goal: Will remain free from infection Outcome: Progressing Goal: Diagnostic test results will improve Outcome: Progressing Goal: Respiratory complications will improve Outcome: Progressing Goal: Cardiovascular complication will be avoided Outcome: Progressing   Problem: Activity: Goal: Risk for activity intolerance will decrease Outcome: Progressing   Problem: Nutrition: Goal: Adequate nutrition will be maintained Outcome: Progressing   Problem: Coping: Goal: Level of anxiety will decrease Outcome: Progressing   Problem: Elimination: Goal: Will not experience complications related to bowel motility Outcome: Progressing Goal: Will not experience complications related to urinary retention Outcome: Progressing   Problem: Pain Managment: Goal: General experience of comfort will improve and/or be controlled Outcome: Progressing   Problem: Safety: Goal: Ability to remain free from injury will improve Outcome: Progressing   Problem: Skin Integrity: Goal: Risk for impaired skin integrity will decrease Outcome: Progressing   Problem: Education: Goal: Will demonstrate proper wound care and an understanding of methods to prevent future damage Outcome: Progressing Goal: Knowledge of disease or condition will improve Outcome: Progressing Goal: Knowledge of the prescribed therapeutic regimen will improve Outcome: Progressing Goal: Individualized Educational Video(s) Outcome: Progressing   Problem:  Activity: Goal: Risk for activity intolerance will decrease Outcome: Progressing   Problem: Cardiac: Goal: Will achieve and/or maintain hemodynamic stability Outcome: Progressing   Problem: Clinical Measurements: Goal: Postoperative complications will be avoided or minimized Outcome: Progressing   Problem: Respiratory: Goal: Respiratory status will improve Outcome: Progressing   Problem: Skin Integrity: Goal: Wound healing without signs and symptoms of infection Outcome: Progressing Goal: Risk for impaired skin integrity will decrease Outcome: Progressing   Problem: Urinary Elimination: Goal: Ability to achieve and maintain adequate renal perfusion and functioning will improve Outcome: Progressing   Problem: Education: Goal: Will demonstrate proper wound care and an understanding of methods to prevent future damage Outcome: Progressing Goal: Knowledge of disease or condition will improve Outcome: Progressing Goal: Knowledge of the prescribed therapeutic regimen will improve Outcome: Progressing Goal: Individualized Educational Video(s) Outcome: Progressing   Problem: Activity: Goal: Risk for activity intolerance will decrease Outcome: Progressing   Problem: Cardiac: Goal: Will achieve and/or maintain hemodynamic stability Outcome: Progressing   Problem: Clinical Measurements: Goal: Postoperative complications will be avoided or minimized Outcome: Progressing   Problem: Respiratory: Goal: Respiratory status will improve Outcome: Progressing   Problem: Skin Integrity: Goal: Wound healing without signs and symptoms of infection Outcome: Progressing Goal: Risk for impaired skin integrity will decrease Outcome: Progressing   Problem: Urinary Elimination: Goal: Ability to achieve and maintain adequate renal perfusion and functioning will improve Outcome: Progressing   Problem: Education: Goal: Ability to describe self-care measures that may prevent or decrease  complications (Diabetes Survival Skills Education) will improve Outcome: Progressing Goal: Individualized Educational Video(s) Outcome: Progressing   Problem: Coping: Goal: Ability to adjust to condition or change in health will improve Outcome: Progressing   Problem: Fluid Volume: Goal: Ability to maintain a balanced intake and output will improve Outcome: Progressing   Problem: Health Behavior/Discharge Planning: Goal: Ability to identify and utilize available resources and services will improve Outcome: Progressing Goal: Ability to manage health-related needs will improve Outcome: Progressing   Problem: Metabolic: Goal: Ability to maintain appropriate glucose levels will  improve Outcome: Progressing   Problem: Nutritional: Goal: Maintenance of adequate nutrition will improve Outcome: Progressing Goal: Progress toward achieving an optimal weight will improve Outcome: Progressing

## 2024-02-03 NOTE — Plan of Care (Signed)

## 2024-02-03 NOTE — Progress Notes (Signed)
      [address]       [place] [address]             [phone]       POD # 2 CABG  Resting comfortably, ambulating well today  BP (!) 117/55   Pulse 68   Temp 98.7 F (37.1 C) (Oral)   Resp 20   Ht 5' 6 (1.676 m)   Wt 82.7 kg   SpO2 94%   BMI 29.43 kg/m   Intake/Output Summary (Last 24 hours) at 02/03/2024 1733 Last data filed at 02/03/2024 0900 Gross per 24 hour  Intake 828.69 ml  Output 105 ml  Net 723.69 ml   Was able to void  Doing well  Elspeth C. Kerrin, MD [facility] [phone]

## 2024-02-03 NOTE — Progress Notes (Signed)
 2 Days Post-Op Procedure(s) (LRB): CORONARY ARTERY BYPASS GRAFTING X 3, USING LEFT INTERNAL MAMMARY ARTERY AND ENDOSCOPIC HARVESTED RIGHT SAPHENOUS VEIN, ENDARTERECTOMY LEFT ANTERIOR DESCENDING CORONARY ARTERY (N/A) ECHOCARDIOGRAM, TRANSESOPHAGEAL (N/A) Subjective: Some pain with deep inspiration, denies nausea  Objective: Vital signs in last 24 hours: Temp:  [98.3 F (36.8 C)-100.4 F (38 C)] 98.6 F (37 C) (07/12 0735) Pulse Rate:  [59-71] 69 (07/12 0700) Cardiac Rhythm: Normal sinus rhythm (07/11 2100) Resp:  [12-21] 20 (07/12 0700) BP: (98-137)/(50-69) 127/64 (07/12 0700) SpO2:  [88 %-98 %] 90 % (07/12 0700) Arterial Line BP: (127-141)/(43-49) 127/43 (07/11 1000) Weight:  [82.7 kg] 82.7 kg (07/12 0500)  Hemodynamic parameters for last 24 hours: CVP:  [10 mmHg-11 mmHg] 11 mmHg CO:  [4.2 L/min-4.3 L/min] 4.3 L/min CI:  [2.3 L/min/m2] 2.3 L/min/m2  Intake/Output from previous day: 07/11 0701 - 07/12 0700 In: 567.7 [I.V.:109.7; IV Piggyback:458.1] Out: 659 [Urine:449; Chest Tube:210] Intake/Output this shift: No intake/output data recorded.  General appearance: alert, cooperative, and no distress Neurologic: intact Heart: regular rate and rhythm Lungs: diminished breath sounds bibasilar Abdomen: normal findings: soft, non-tender  Lab Results: Recent Labs    02/02/24 1622 02/03/24 0324  WBC 13.8* 10.9*  HGB 8.1* 7.7*  HCT 25.4* 24.7*  PLT 125* 115*   BMET:  Recent Labs    02/02/24 1622 02/03/24 0324  NA 137 138  K 4.3 4.1  CL 106 106  CO2 23 24  GLUCOSE 151* 123*  BUN 16 19  CREATININE 0.86 0.89  CALCIUM  8.6* 8.7*    PT/INR:  Recent Labs    02/01/24 1748  LABPROT 17.8*  INR 1.4*   ABG    Component Value Date/Time   PHART 7.302 (L) 02/02/2024 0454   HCO3 22.2 02/02/2024 0454   TCO2 24 02/02/2024 0454   ACIDBASEDEF 4.0 (H) 02/02/2024 0454   O2SAT 96 02/02/2024 0454   CBG (last 3)  Recent Labs    02/02/24 2302 02/03/24 0345 02/03/24 0738   GLUCAP 138* 107* 132*    Assessment/Plan: S/P Procedure(s) (LRB): CORONARY ARTERY BYPASS GRAFTING X 3, USING LEFT INTERNAL MAMMARY ARTERY AND ENDOSCOPIC HARVESTED RIGHT SAPHENOUS VEIN, ENDARTERECTOMY LEFT ANTERIOR DESCENDING CORONARY ARTERY (N/A) ECHOCARDIOGRAM, TRANSESOPHAGEAL (N/A) POD # 2 NEURO-intact CV- in SR with BP well controlled  ASA + Plavix   Statin, beta blocker RESP - IS for atelectasis RENAL- creatinine and lytes OK  No void since Foley removed- monitor  PO lasix  as weight above preop ENDO- CBG well controlled  Change SSI to [facility] and HS GI- tolerating diet Continue cardiac rehab Dc chest drains   LOS: 2 days    Elspeth JAYSON Millers 02/03/2024

## 2024-02-04 LAB — BASIC METABOLIC PANEL WITH GFR
Anion gap: 8 (ref 5–15)
BUN: 19 mg/dL (ref 8–23)
CO2: 25 mmol/L (ref 22–32)
Calcium: 9 mg/dL (ref 8.9–10.3)
Chloride: 104 mmol/L (ref 98–111)
Creatinine, Ser: 0.85 mg/dL (ref 0.44–1.00)
GFR, Estimated: >60 mL/min (ref >60)
Glucose, Bld: 104 mg/dL — ABNORMAL HIGH (ref 70–99)
Potassium: 3.7 mmol/L (ref 3.5–5.1)
Sodium: 137 mmol/L (ref 135–145)

## 2024-02-04 LAB — POCT I-STAT 7, (LYTES, BLD GAS, ICA,H+H)
Acid-base deficit: 3 mmol/L — ABNORMAL HIGH (ref 0.0–2.0)
Acid-base deficit: 3 mmol/L — ABNORMAL HIGH (ref 0.0–2.0)
Bicarbonate: 23.1 mmol/L (ref 20.0–28.0)
Bicarbonate: 22.4 mmol/L (ref 20.0–28.0)
Calcium, Ion: 1.26 mmol/L (ref 1.15–1.40)
Calcium, Ion: 1.24 mmol/L (ref 1.15–1.40)
HCT: 24 % — ABNORMAL LOW (ref 36.0–46.0)
HCT: 24 % — ABNORMAL LOW (ref 36.0–46.0)
Hemoglobin: 8.2 g/dL — ABNORMAL LOW (ref 12.0–15.0)
Hemoglobin: 8.2 g/dL — ABNORMAL LOW (ref 12.0–15.0)
O2 Saturation: 97 %
O2 Saturation: 99 %
Patient temperature: 37
Patient temperature: 37.3
Potassium: 4.1 mmol/L (ref 3.5–5.1)
Potassium: 4.1 mmol/L (ref 3.5–5.1)
Sodium: 140 mmol/L (ref 135–145)
Sodium: 140 mmol/L (ref 135–145)
TCO2: 25 mmol/L (ref 22–32)
TCO2: 24 mmol/L (ref 22–32)
pCO2 arterial: 48.5 mmHg — ABNORMAL HIGH (ref 32–48)
pCO2 arterial: 43.1 mmHg (ref 32–48)
pH, Arterial: 7.286 — ABNORMAL LOW (ref 7.35–7.45)
pH, Arterial: 7.325 — ABNORMAL LOW (ref 7.35–7.45)
pO2, Arterial: 101 mmHg (ref 83–108)
pO2, Arterial: 150 mmHg — ABNORMAL HIGH (ref 83–108)

## 2024-02-04 LAB — CBC
HCT: 27 % — ABNORMAL LOW (ref 36.0–46.0)
Hemoglobin: 8.7 g/dL — ABNORMAL LOW (ref 12.0–15.0)
MCH: 30.4 pg (ref 26.0–34.0)
MCHC: 32.2 g/dL (ref 30.0–36.0)
MCV: 94.4 fL (ref 80.0–100.0)
Platelets: 150 K/uL (ref 150–400)
RBC: 2.86 MIL/uL — ABNORMAL LOW (ref 3.87–5.11)
RDW: 13.6 % (ref 11.5–15.5)
WBC: 13.5 K/uL — ABNORMAL HIGH (ref 4.0–10.5)
nRBC: 0 % (ref 0.0–0.2)

## 2024-02-04 LAB — GLUCOSE, CAPILLARY: Glucose-Capillary: 80 mg/dL (ref 70–99)

## 2024-02-04 MED ORDER — SODIUM CHLORIDE 0.9% FLUSH: 3.0000 mL | INTRAVENOUS | Status: DC | PRN

## 2024-02-04 MED ORDER — SODIUM CHLORIDE 0.9 % IV SOLN
250.0000 mL | INTRAVENOUS | Status: AC | PRN
Start: 2024-02-04 — End: 2024-02-05

## 2024-02-04 MED ORDER — MAGNESIUM HYDROXIDE 400 MG/5ML PO SUSP: 15.0000 mL | Freq: Every day | ORAL | Status: DC | PRN

## 2024-02-04 MED ORDER — ALPRAZOLAM 0.5 MG PO TABS
0.5000 mg | ORAL_TABLET | Freq: Three times a day (TID) | ORAL | Status: DC | PRN
  Administered 2024-02-04 – 2024-02-08 (×5): 0.5 mg via ORAL
  Filled 2024-02-04 (×5): qty 1

## 2024-02-04 MED ORDER — ~~LOC~~ CARDIAC SURGERY, PATIENT & FAMILY EDUCATION
Freq: Once | Status: AC
Start: 2024-02-04 — End: 2024-02-04

## 2024-02-04 MED ORDER — METOPROLOL SUCCINATE ER 25 MG PO TB24
25.0000 mg | ORAL_TABLET | Freq: Every day | ORAL | Status: DC
  Administered 2024-02-04 – 2024-02-05 (×2): 25 mg via ORAL
  Filled 2024-02-04 (×2): qty 1

## 2024-02-04 MED ORDER — SODIUM CHLORIDE 0.9% FLUSH
3.0000 mL | Freq: Two times a day (BID) | INTRAVENOUS | Status: DC
  Administered 2024-02-04 – 2024-02-09 (×11): 3 mL via INTRAVENOUS

## 2024-02-04 MED ORDER — ALUM & MAG HYDROXIDE-SIMETH 200-200-20 MG/5ML PO SUSP: 15.0000 mL | Freq: Four times a day (QID) | ORAL | Status: DC | PRN

## 2024-02-04 NOTE — Progress Notes (Signed)
 02/04/2024 Seen and examined. AM labs still pending. Seems to be doing well, understand going to progressive, will be available as needed.  Rolan Sharps MD PCCM

## 2024-02-04 NOTE — Progress Notes (Signed)
      [address]       [place] [address]             [phone]       No new issues  BP (!) 144/73 (BP Location: Right Arm)   Pulse 73   Temp 98.8 F (37.1 C) (Oral)   Resp 18   Ht 5' 6 (1.676 m)   Wt 82.6 kg   SpO2 93%   BMI 29.38 kg/m   Awaiting bed on 4E  Somer Trotter C. Kerrin, MD [facility] [phone]

## 2024-02-04 NOTE — Progress Notes (Signed)
 3 Days Post-Op Procedure(s) (LRB): CORONARY ARTERY BYPASS GRAFTING X 3, USING LEFT INTERNAL MAMMARY ARTERY AND ENDOSCOPIC HARVESTED RIGHT SAPHENOUS VEIN, ENDARTERECTOMY LEFT ANTERIOR DESCENDING CORONARY ARTERY (N/A) ECHOCARDIOGRAM, TRANSESOPHAGEAL (N/A) Subjective: No complaints this AM  Objective: Vital signs in last 24 hours: Temp:  [98.1 F (36.7 C)-99.1 F (37.3 C)] 98.7 F (37.1 C) (07/13 0803) Pulse Rate:  [56-74] 74 (07/13 0800) Cardiac Rhythm: Normal sinus rhythm (07/13 0800) Resp:  [12-21] 18 (07/13 0400) BP: (89-146)/(51-76) 130/70 (07/13 0800) SpO2:  [90 %-100 %] 98 % (07/13 0800) Weight:  [82.6 kg] 82.6 kg (07/13 0500)  Hemodynamic parameters for last 24 hours:    Intake/Output from previous day: 07/12 0701 - 07/13 0700 In: 1060 [P.O.:960; IV Piggyback:100] Out: 50 [Chest Tube:50] Intake/Output this shift: No intake/output data recorded.  General appearance: alert, cooperative, and no distress Neurologic: intact Heart: regular rate and rhythm Lungs: clear to auscultation bilaterally Abdomen: normal findings: soft, non-tender  Lab Results: Recent Labs    02/02/24 1622 02/03/24 0324  WBC 13.8* 10.9*  HGB 8.1* 7.7*  HCT 25.4* 24.7*  PLT 125* 115*   BMET:  Recent Labs    02/02/24 1622 02/03/24 0324  NA 137 138  K 4.3 4.1  CL 106 106  CO2 23 24  GLUCOSE 151* 123*  BUN 16 19  CREATININE 0.86 0.89  CALCIUM  8.6* 8.7*    PT/INR:  Recent Labs    02/01/24 1748  LABPROT 17.8*  INR 1.4*   ABG    Component Value Date/Time   PHART 7.302 (L) 02/02/2024 0454   HCO3 22.2 02/02/2024 0454   TCO2 24 02/02/2024 0454   ACIDBASEDEF 4.0 (H) 02/02/2024 0454   O2SAT 96 02/02/2024 0454   CBG (last 3)  Recent Labs    02/03/24 1601 02/03/24 1914 02/04/24 0611  GLUCAP 97 102* 80    Assessment/Plan: S/P Procedure(s) (LRB): CORONARY ARTERY BYPASS GRAFTING X 3, USING LEFT INTERNAL MAMMARY ARTERY AND ENDOSCOPIC HARVESTED RIGHT SAPHENOUS VEIN,  ENDARTERECTOMY LEFT ANTERIOR DESCENDING CORONARY ARTERY (N/A) ECHOCARDIOGRAM, TRANSESOPHAGEAL (N/A) Plan for transfer to step-down: see transfer orders POD # 3 Looks good NEURO- intact CV- in SR   Change lopressor  to Toprol  (home med)  ASA + plavix  for endarterectomy  Rosuvastatin  RESP- continue IS RENAL- creatinine and lytes Ok  Continue Lasix  + K ENDO- CBG well controlled   Dc SSI Gi - tolerating diet Continue cardiac rehab SCD + enoxaparin  for DVT prophylaxis  LOS: 3 days    Elspeth JAYSON Millers 02/04/2024

## 2024-02-05 ENCOUNTER — Inpatient Hospital Stay (HOSPITAL_COMMUNITY)

## 2024-02-05 DIAGNOSIS — Z951 Presence of aortocoronary bypass graft: Secondary | ICD-10-CM | POA: Diagnosis not present

## 2024-02-05 LAB — CBC
HCT: 24.8 % — ABNORMAL LOW (ref 36.0–46.0)
Hemoglobin: 8 g/dL — ABNORMAL LOW (ref 12.0–15.0)
MCH: 30.2 pg (ref 26.0–34.0)
MCHC: 32.3 g/dL (ref 30.0–36.0)
MCV: 93.6 fL (ref 80.0–100.0)
Platelets: 133 K/uL — ABNORMAL LOW (ref 150–400)
RBC: 2.65 MIL/uL — ABNORMAL LOW (ref 3.87–5.11)
RDW: 13.7 % (ref 11.5–15.5)
WBC: 9.6 K/uL (ref 4.0–10.5)
nRBC: 0 % (ref 0.0–0.2)

## 2024-02-05 LAB — BASIC METABOLIC PANEL WITH GFR
Anion gap: 7 (ref 5–15)
BUN: 14 mg/dL (ref 8–23)
CO2: 26 mmol/L (ref 22–32)
Calcium: 8.7 mg/dL — ABNORMAL LOW (ref 8.9–10.3)
Chloride: 107 mmol/L (ref 98–111)
Creatinine, Ser: 0.73 mg/dL (ref 0.44–1.00)
GFR, Estimated: >60 mL/min (ref >60)
Glucose, Bld: 89 mg/dL (ref 70–99)
Potassium: 3.8 mmol/L (ref 3.5–5.1)
Sodium: 140 mmol/L (ref 135–145)

## 2024-02-05 LAB — MAGNESIUM: Magnesium: 2.5 mg/dL — ABNORMAL HIGH (ref 1.7–2.4)

## 2024-02-05 MED ORDER — HYALURONIDASE HUMAN 150 UNIT/ML IJ SOLN
150.0000 [IU] | Freq: Once | INTRAMUSCULAR | Status: AC
  Administered 2024-02-05: 150 [IU] via SUBCUTANEOUS
  Filled 2024-02-05: qty 1

## 2024-02-05 MED ORDER — AMIODARONE IV BOLUS ONLY 150 MG/100ML
INTRAVENOUS | Status: AC
  Filled 2024-02-05: qty 100

## 2024-02-05 MED ORDER — AMIODARONE HCL IN DEXTROSE 360-4.14 MG/200ML-% IV SOLN
30.0000 mg/h | INTRAVENOUS | Status: DC
  Administered 2024-02-05 – 2024-02-06 (×3): 30 mg/h via INTRAVENOUS
  Filled 2024-02-05 (×3): qty 200

## 2024-02-05 MED ORDER — AMIODARONE HCL IN DEXTROSE 360-4.14 MG/200ML-% IV SOLN
60.0000 mg/h | INTRAVENOUS | Status: AC
  Administered 2024-02-05 (×2): 60 mg/h via INTRAVENOUS
  Filled 2024-02-05: qty 200

## 2024-02-05 MED ORDER — POTASSIUM CHLORIDE 20 MEQ PO PACK
40.0000 meq | PACK | Freq: Once | ORAL | Status: AC
  Administered 2024-02-05: 40 meq via ORAL
  Filled 2024-02-05: qty 2

## 2024-02-05 MED ORDER — METOPROLOL SUCCINATE ER 25 MG PO TB24
25.0000 mg | ORAL_TABLET | Freq: Every day | ORAL | Status: AC
  Administered 2024-02-05: 25 mg via ORAL
  Filled 2024-02-05: qty 1

## 2024-02-05 MED ORDER — AMIODARONE LOAD VIA INFUSION
150.0000 mg | Freq: Once | INTRAVENOUS | Status: AC
  Filled 2024-02-05: qty 83.34

## 2024-02-05 MED ORDER — POTASSIUM CHLORIDE CRYS ER 20 MEQ PO TBCR
40.0000 meq | EXTENDED_RELEASE_TABLET | Freq: Once | ORAL | Status: DC
Start: 2024-02-05 — End: 2024-02-05
  Filled 2024-02-05: qty 2

## 2024-02-05 MED ORDER — AMIODARONE HCL IN DEXTROSE 360-4.14 MG/200ML-% IV SOLN
INTRAVENOUS | Status: AC
  Administered 2024-02-05: 150 mg via INTRAVENOUS
  Filled 2024-02-05: qty 200

## 2024-02-05 MED ORDER — AMIODARONE LOAD VIA INFUSION
150.0000 mg | Freq: Once | INTRAVENOUS | Status: AC
  Administered 2024-02-05: 150 mg via INTRAVENOUS
  Filled 2024-02-05: qty 83.34

## 2024-02-05 MED ORDER — METOPROLOL SUCCINATE ER 50 MG PO TB24
50.0000 mg | ORAL_TABLET | Freq: Every day | ORAL | Status: DC
  Administered 2024-02-06 – 2024-02-09 (×4): 50 mg via ORAL
  Filled 2024-02-05 (×4): qty 1

## 2024-02-05 NOTE — Progress Notes (Signed)
      [address]       [place] [address]             [phone]                 4 Days Post-Op Procedure(s) (LRB): CORONARY ARTERY BYPASS GRAFTING X 3, USING LEFT INTERNAL MAMMARY ARTERY AND ENDOSCOPIC HARVESTED RIGHT SAPHENOUS VEIN, ENDARTERECTOMY LEFT ANTERIOR DESCENDING CORONARY ARTERY (N/A) ECHOCARDIOGRAM, TRANSESOPHAGEAL (N/A)   Events: No events.   _______________________________________________________________ Vitals: BP (!) 156/82   Pulse 82   Temp 98.2 F (36.8 C) (Oral)   Resp 18   Ht 5' 6 (1.676 m)   Wt 82.6 kg   SpO2 96%   BMI 29.38 kg/m  Filed Weights   02/02/24 0602 02/03/24 0500 02/04/24 0500  Weight: 81.9 kg 82.7 kg 82.6 kg     - Neuro: Alert NAD  - Cardiovascular: SVT 160s  Drips: amio    - Pulm: Easy work of breathing  ABG    Component Value Date/Time   PHART 7.302 (L) 02/02/2024 0454   PCO2ART 45.2 02/02/2024 0454   PO2ART 97 02/02/2024 0454   HCO3 22.2 02/02/2024 0454   TCO2 24 02/02/2024 0454   ACIDBASEDEF 4.0 (H) 02/02/2024 0454   O2SAT 96 02/02/2024 0454    - Abd: Nondistended - Extremity: Warm  .Intake/Output      07/13 0701 07/14 0700 07/14 0701 07/15 0700   P.O. 240    IV Piggyback     Total Intake(mL/kg) 240 (2.9)    Urine (mL/kg/hr) 300 (0.2)    Chest Tube     Total Output 300    Net -60         Urine Occurrence 3 x       _______________________________________________________________ Labs:    Latest Ref Rng & Units 02/05/2024    2:24 AM 02/04/2024   10:46 AM 02/03/2024    3:24 AM  CBC  WBC 4.0 - 10.5 K/uL 9.6  13.5  10.9   Hemoglobin 12.0 - 15.0 g/dL 8.0  8.7  7.7   Hematocrit 36.0 - 46.0 % 24.8  27.0  24.7   Platelets 150 - 400 K/uL 133  150  115       Latest Ref Rng & Units 02/05/2024    2:24 AM 02/04/2024   10:46 AM 02/03/2024    3:24 AM  CMP  Glucose 70 - 99 mg/dL 89  895  876   BUN 8 - 23 mg/dL 14  19  19    Creatinine 0.44 - 1.00 mg/dL 9.26  9.14  9.10   Sodium 135 - 145  mmol/L 140  137  138   Potassium 3.5 - 5.1 mmol/L 3.8  3.7  4.1   Chloride 98 - 111 mmol/L 107  104  106   CO2 22 - 32 mmol/L 26  25  24    Calcium  8.9 - 10.3 mg/dL 8.7  9.0  8.7     CXR: -  _______________________________________________________________  Assessment and Plan: POD 4 s/p CABG with LAD endarterectomy  Neuro: Pain control CV: on amio gtt. Metop, and plavix  Pulm: Pulmonary hygiene. Renal: Creatinine stable.  diuresing GI: On diet. Heme: Stable ID: Afebrile Endo: Sliding scale insulin  Dispo: Continue ICU care   Christine Hart 02/05/2024 7:51 AM

## 2024-02-05 NOTE — Progress Notes (Signed)
   NAME:  Christine Hart, MRN:  [id number], DOB:  1945-12-11, LOS: 4 ADMISSION DATE:  02/01/2024, CONSULTATION DATE:  02/01/24 REFERRING MD:  Dr. Shyrl, CHIEF COMPLAINT:  post op CABG   History of Present Illness:  Pt encephalopathic, therefore HPI obtained from EMR review.   63 yoF with PMH significant for CAD, mitral valve prolapse, SVT, HTN, HLD, chronic back pain, arthritis, and anxiety with reported several month hx of fatigue with occasional chest tightness and pressure found to have multivessel CAD on [facility] 6/23.  TTE 6/26 with LVEF 70-75%, trivial MR.  Pt underwent CABG x 3> LIMA -LAD, SVG to OM, and SVG to diagonal on 7/10 by Dr. Shyrl.  Noted by anesthesia to be difficult airway due to prior cervical spine surgeries and hx of TMJ.  Intra op, LAD noted to be heavily calcified with initial anastomosis not hemostatic after removing cross clamp requiring aortic reclamping, endarterectomy and LIMA connected with resultant hemostasis and transition off CPB.  PCCM consulted for vent and post op medical management in ICU.   Pertinent  Medical History   Past Medical History:  Diagnosis Date   Anxiety    Arthritis    Cancer (HCC)    basal and squamous cell carcinoma. Lesion excisions in 2023.   Complication of anesthesia    Limited mouth opening and neck mobility, s/p TMJ surgeries    Difficult intubation    Fibromyalgia    Hypertension    MVP (mitral valve prolapse)    Pneumonia 2023   Hospitalized for 2 nights, [facility].   PONV (postoperative nausea and vomiting)    Significant Hospital Events: Including procedures, antibiotic start and stop dates in addition to other pertinent events   7/10 CABG x 3  Interim History / Subjective:  Afib RVR this am with some palpitations otherwise doing well.  Objective    Blood pressure 105/64, pulse (!) 139, temperature 98.2 F (36.8 C), temperature source Oral, resp. rate 18, height 5' 6 (1.676 m), weight 82.6 kg, SpO2 96%.         Intake/Output Summary (Last 24 hours) at 02/05/2024 9062 Last data filed at 02/05/2024 0800 Gross per 24 hour  Intake 256.86 ml  Output 300 ml  Net -43.14 ml   Filed Weights   02/02/24 0602 02/03/24 0500 02/04/24 0500  Weight: 81.9 kg 82.7 kg 82.6 kg   Examination: No distress Moves to command Sternotomy site looks fine Ext warm Lungs diminished bases Strength okay Nonfocal neuro  BMP benign Slightly hgb and plt drop   Resolved problem list   Assessment and Plan   Multivessel CAD s/p CABG x3, LIMA -LAD, SVG-OM, SVG-Diagonal Post bypass vasoplegia- resolved Volume status- seems euvolemic HTN HLD Postop glucose control- good Postop Afib/RVR, has hx of SVT, BP borderline  - Amio bolus and gtt, also going to get metoprolol  XL, if BP tolerates may need to titrate up latter - Check CXR - Replete K - Progressive mobility - Keep eye on CBC  Rolan Sharps MD PCCM

## 2024-02-06 ENCOUNTER — Inpatient Hospital Stay (HOSPITAL_COMMUNITY)

## 2024-02-06 DIAGNOSIS — R008 Other abnormalities of heart beat: Secondary | ICD-10-CM

## 2024-02-06 DIAGNOSIS — R9431 Abnormal electrocardiogram [ECG] [EKG]: Secondary | ICD-10-CM

## 2024-02-06 DIAGNOSIS — Z951 Presence of aortocoronary bypass graft: Secondary | ICD-10-CM | POA: Diagnosis not present

## 2024-02-06 LAB — ECHOCARDIOGRAM LIMITED
Area-P 1/2: 4.39 cm2
Calc EF: 50.1 %
Height: 66 in
S' Lateral: 2.8 cm
Single Plane A2C EF: 44 %
Single Plane A4C EF: 52.3 %
Weight: 2902.4 [oz_av]

## 2024-02-06 LAB — CBC
HCT: 27 % — ABNORMAL LOW (ref 36.0–46.0)
Hemoglobin: 8.7 g/dL — ABNORMAL LOW (ref 12.0–15.0)
MCH: 29.8 pg (ref 26.0–34.0)
MCHC: 32.2 g/dL (ref 30.0–36.0)
MCV: 92.5 fL (ref 80.0–100.0)
Platelets: 195 K/uL (ref 150–400)
RBC: 2.92 MIL/uL — ABNORMAL LOW (ref 3.87–5.11)
RDW: 13.8 % (ref 11.5–15.5)
WBC: 10.7 K/uL — ABNORMAL HIGH (ref 4.0–10.5)
nRBC: 0 % (ref 0.0–0.2)

## 2024-02-06 LAB — RENAL FUNCTION PANEL
Albumin: 3.3 g/dL — ABNORMAL LOW (ref 3.5–5.0)
Anion gap: 10 (ref 5–15)
BUN: 16 mg/dL (ref 8–23)
CO2: 24 mmol/L (ref 22–32)
Calcium: 8.7 mg/dL — ABNORMAL LOW (ref 8.9–10.3)
Chloride: 105 mmol/L (ref 98–111)
Creatinine, Ser: 0.77 mg/dL (ref 0.44–1.00)
GFR, Estimated: >60 mL/min (ref >60)
Glucose, Bld: 104 mg/dL — ABNORMAL HIGH (ref 70–99)
Phosphorus: 1.7 mg/dL — ABNORMAL LOW (ref 2.5–4.6)
Potassium: 4 mmol/L (ref 3.5–5.1)
Sodium: 139 mmol/L (ref 135–145)

## 2024-02-06 MED ORDER — PERFLUTREN LIPID MICROSPHERE: 1.0000 mL | INTRAVENOUS | Status: AC | PRN

## 2024-02-06 MED ORDER — AMIODARONE HCL 200 MG PO TABS
400.0000 mg | ORAL_TABLET | Freq: Two times a day (BID) | ORAL | Status: DC
  Administered 2024-02-06 – 2024-02-09 (×7): 400 mg via ORAL
  Filled 2024-02-06 (×7): qty 2

## 2024-02-06 MED ORDER — SODIUM CHLORIDE 0.9% FLUSH
3.0000 mL | INTRAVENOUS | Status: DC | PRN
Start: 2024-02-06 — End: 2024-02-09

## 2024-02-06 MED ORDER — K PHOS MONO-SOD PHOS DI & MONO 155-852-130 MG PO TABS
500.0000 mg | ORAL_TABLET | ORAL | Status: AC
  Administered 2024-02-06 (×4): 500 mg via ORAL
  Filled 2024-02-06 (×4): qty 2

## 2024-02-06 MED ORDER — IPRATROPIUM-ALBUTEROL 0.5-2.5 (3) MG/3ML IN SOLN
3.0000 mL | Freq: Four times a day (QID) | RESPIRATORY_TRACT | Status: DC | PRN
  Administered 2024-02-06: 3 mL via RESPIRATORY_TRACT

## 2024-02-06 MED ORDER — ~~LOC~~ CARDIAC SURGERY, PATIENT & FAMILY EDUCATION: Freq: Once | Status: AC

## 2024-02-06 MED ORDER — IPRATROPIUM-ALBUTEROL 0.5-2.5 (3) MG/3ML IN SOLN
RESPIRATORY_TRACT | Status: AC
  Filled 2024-02-06: qty 3

## 2024-02-06 MED ORDER — SODIUM CHLORIDE 0.9 % IV SOLN: 250.0000 mL | INTRAVENOUS | Status: AC | PRN

## 2024-02-06 MED ORDER — SODIUM CHLORIDE 0.9% FLUSH
3.0000 mL | Freq: Two times a day (BID) | INTRAVENOUS | Status: DC
  Administered 2024-02-06 – 2024-02-09 (×6): 3 mL via INTRAVENOUS

## 2024-02-06 NOTE — TOC Progression Note (Signed)
 Transition of Care [facility]) - Progression Note    Patient Details  Name: MAE DENUNZIO MRN: [id number] Date of Birth: 28-Jun-1946  Transition of [facility]) CM/SW Contact  Graves-Bigelow, Erminio Deems, RN Phone Number: 02/06/2024, 1:40 PM  Clinical Narrative:  Patient POD-5 CABG. [facility] liaison -following patient with TCTS office protocol referral prearranged for [facility] needs if needed. Case Manager will continue to follow for disposition needs as the patient progresses.                Expected Discharge Plan: Home w Home Health Services Barriers to Discharge: Continued Medical Work up  Expected Discharge Plan and Services In-house Referral: NA Discharge Planning Services: CM Consult Post Acute Care Choice: Home Health Living arrangements for the past 2 months: Single Family Home                   DME Agency: NA                   Social Determinants of Health (SDOH) Interventions SDOH Screenings   Food Insecurity: No Food Insecurity (02/03/2024)  Housing: Low Risk  (02/03/2024)  Transportation Needs: No Transportation Needs (02/03/2024)  Utilities: Not At Risk (02/03/2024)  Social Connections: Moderately Integrated (02/03/2024)  Tobacco Use: Low Risk  (02/01/2024)    Readmission Risk Interventions     No data to display

## 2024-02-06 NOTE — Progress Notes (Signed)
      [address]       [place] [address]             [phone]                 5 Days Post-Op Procedure(s) (LRB): CORONARY ARTERY BYPASS GRAFTING X 3, USING LEFT INTERNAL MAMMARY ARTERY AND ENDOSCOPIC HARVESTED RIGHT SAPHENOUS VEIN, ENDARTERECTOMY LEFT ANTERIOR DESCENDING CORONARY ARTERY (N/A) ECHOCARDIOGRAM, TRANSESOPHAGEAL (N/A)   Events: No events.   _______________________________________________________________ Vitals: BP 133/68 (BP Location: Right Arm)   Pulse 69   Temp 98.4 F (36.9 C) (Oral)   Resp 18   Ht 5' 6 (1.676 m)   Wt 82.3 kg   SpO2 99%   BMI 29.28 kg/m  Filed Weights   02/03/24 0500 02/04/24 0500 02/06/24 0500  Weight: 82.7 kg 82.6 kg 82.3 kg     - Neuro: Alert NAD  - Cardiovascular: sinus  Drips: amio    - Pulm: Easy work of breathing  ABG    Component Value Date/Time   PHART 7.302 (L) 02/02/2024 0454   PCO2ART 45.2 02/02/2024 0454   PO2ART 97 02/02/2024 0454   HCO3 22.2 02/02/2024 0454   TCO2 24 02/02/2024 0454   ACIDBASEDEF 4.0 (H) 02/02/2024 0454   O2SAT 96 02/02/2024 0454    - Abd: Nondistended - Extremity: Warm  .Intake/Output      07/14 0701 07/15 0700 07/15 0701 07/16 0700   P.O. 120    I.V. (mL/kg) 658.5 (8) 33.3 (0.4)   Total Intake(mL/kg) 778.5 (9.5) 33.3 (0.4)   Urine (mL/kg/hr) 500 (0.3)    Stool 1    Total Output 501    Net +277.5 +33.3        Urine Occurrence 5 x 1 x   Stool Occurrence 2 x    Emesis Occurrence 0 x       _______________________________________________________________ Labs:    Latest Ref Rng & Units 02/06/2024    3:46 AM 02/05/2024    2:24 AM 02/04/2024   10:46 AM  CBC  WBC 4.0 - 10.5 K/uL 10.7  9.6  13.5   Hemoglobin 12.0 - 15.0 g/dL 8.7  8.0  8.7   Hematocrit 36.0 - 46.0 % 27.0  24.8  27.0   Platelets 150 - 400 K/uL 195  133  150       Latest Ref Rng & Units 02/06/2024    3:46 AM 02/05/2024    2:24 AM 02/04/2024   10:46 AM  CMP  Glucose 70 - 99 mg/dL 895  89   895   BUN 8 - 23 mg/dL 16  14  19    Creatinine 0.44 - 1.00 mg/dL 9.22  9.26  9.14   Sodium 135 - 145 mmol/L 139  140  137   Potassium 3.5 - 5.1 mmol/L 4.0  3.8  3.7   Chloride 98 - 111 mmol/L 105  107  104   CO2 22 - 32 mmol/L 24  26  25    Calcium  8.9 - 10.3 mg/dL 8.7  8.7  9.0     CXR: -  _______________________________________________________________  Assessment and Plan: POD 5 s/p CABG with LAD endarterectomy  Neuro: Pain control CV: on amiopo Metop, and plavix  Pulm: Pulmonary hygiene. Renal: Creatinine stable.  diuresing GI: On diet. Heme: Stable ID: Afebrile Endo: Sliding scale insulin  Dispo: floor    Sakiya Stepka O Shareka Casale 02/06/2024 10:26 AM

## 2024-02-06 NOTE — Progress Notes (Signed)
  Echocardiogram 2D Echocardiogram has been performed.  Christine Hart 02/06/2024, 10:45 AM

## 2024-02-06 NOTE — Progress Notes (Signed)
   NAME:  Christine Hart, MRN:  [id number], DOB:  07-17-1946, LOS: 5 ADMISSION DATE:  02/01/2024, CONSULTATION DATE:  02/01/24 REFERRING MD:  Dr. Shyrl, CHIEF COMPLAINT:  post op CABG   History of Present Illness:  Pt encephalopathic, therefore HPI obtained from EMR review.   67 yoF with PMH significant for CAD, mitral valve prolapse, SVT, HTN, HLD, chronic back pain, arthritis, and anxiety with reported several month hx of fatigue with occasional chest tightness and pressure found to have multivessel CAD on [facility] 6/23.  TTE 6/26 with LVEF 70-75%, trivial MR.  Pt underwent CABG x 3> LIMA -LAD, SVG to OM, and SVG to diagonal on 7/10 by Dr. Shyrl.  Noted by anesthesia to be difficult airway due to prior cervical spine surgeries and hx of TMJ.  Intra op, LAD noted to be heavily calcified with initial anastomosis not hemostatic after removing cross clamp requiring aortic reclamping, endarterectomy and LIMA connected with resultant hemostasis and transition off CPB.  PCCM consulted for vent and post op medical management in ICU.   Pertinent  Medical History   Past Medical History:  Diagnosis Date   Anxiety    Arthritis    Cancer (HCC)    basal and squamous cell carcinoma. Lesion excisions in 2023.   Complication of anesthesia    Limited mouth opening and neck mobility, s/p TMJ surgeries    Difficult intubation    Fibromyalgia    Hypertension    MVP (mitral valve prolapse)    Pneumonia 2023   Hospitalized for 2 nights, [place].   PONV (postoperative nausea and vomiting)    Significant Hospital Events: Including procedures, antibiotic start and stop dates in addition to other pertinent events   7/10 CABG x 3 7/14: afib rvr with some palpitations; diuresing  7/15: converted back to NSR this morning.   Interim History / Subjective:  NAEON. Back in NSR this morning. Has rub on exam, going to get a limited echo  Objective    Blood pressure 133/68, pulse 69, temperature 98.4 F  (36.9 C), temperature source Oral, resp. rate 18, height 5' 6 (1.676 m), weight 82.3 kg, SpO2 99%.        Intake/Output Summary (Last 24 hours) at 02/06/2024 9070 Last data filed at 02/06/2024 0700 Gross per 24 hour  Intake 728.36 ml  Output 501 ml  Net 227.36 ml   Filed Weights   02/03/24 0500 02/04/24 0500 02/06/24 0500  Weight: 82.7 kg 82.6 kg 82.3 kg   Examination: Older female, sitting oob in chair, NAD  Ncat, mmm, perrla  Awake, alert, oriented, non focal  Sternotomy okay, has pericardial rub  No le edema   Resolved problem list  Post bypass vasoplegia  Assessment and Plan   Multivessel CAD s/p CABG x3, LIMA -LAD, SVG-OM, SVG-Diagonal Volume status- seems euvolemic HTN HLD Postop glucose control- good Postop Afib/RVR, has hx of SVT, BP borderline  - still on amio gtt, TCTS to decide when to transition to orals  - Replete K and phos - limited echo today, she has a rub  - lasix  daily per TCTS  - crestor , aspirin , plavix , metoprolol   - Progressive mobility  CC time: 32   Tinnie FORBES Furth, PA-C [facility] 02/06/24 9:47 AM  Please see [email or web address] for pager details.  From 7A-7P if no response, please call [phone] After hours, please call ELink [phone]

## 2024-02-06 NOTE — Progress Notes (Signed)
      [address]       [place] [address]             [phone]       In SR  BP 129/66   Pulse 74   Temp 98.3 F (36.8 C) (Oral)   Resp 16   Ht 5' 6 (1.676 m)   Wt 82.3 kg   SpO2 98%   BMI 29.28 kg/m   Intake/Output Summary (Last 24 hours) at 02/06/2024 1814 Last data filed at 02/06/2024 1700 Gross per 24 hour  Intake 1119.86 ml  Output --  Net 1119.86 ml   Awaiting bed on 4E  Nicco Reaume C. Kerrin, MD [facility] [phone]

## 2024-02-07 LAB — RENAL FUNCTION PANEL
Albumin: 3.2 g/dL — ABNORMAL LOW (ref 3.5–5.0)
Anion gap: 10 (ref 5–15)
BUN: 15 mg/dL (ref 8–23)
CO2: 26 mmol/L (ref 22–32)
Calcium: 8.3 mg/dL — ABNORMAL LOW (ref 8.9–10.3)
Chloride: 102 mmol/L (ref 98–111)
Creatinine, Ser: 0.73 mg/dL (ref 0.44–1.00)
GFR, Estimated: >60 mL/min (ref >60)
Glucose, Bld: 105 mg/dL — ABNORMAL HIGH (ref 70–99)
Phosphorus: 2.9 mg/dL (ref 2.5–4.6)
Potassium: 3.4 mmol/L — ABNORMAL LOW (ref 3.5–5.1)
Sodium: 138 mmol/L (ref 135–145)

## 2024-02-07 LAB — CBC
HCT: 25.7 % — ABNORMAL LOW (ref 36.0–46.0)
Hemoglobin: 8.4 g/dL — ABNORMAL LOW (ref 12.0–15.0)
MCH: 30.3 pg (ref 26.0–34.0)
MCHC: 32.7 g/dL (ref 30.0–36.0)
MCV: 92.8 fL (ref 80.0–100.0)
Platelets: 215 K/uL (ref 150–400)
RBC: 2.77 MIL/uL — ABNORMAL LOW (ref 3.87–5.11)
RDW: 13.9 % (ref 11.5–15.5)
WBC: 9 K/uL (ref 4.0–10.5)
nRBC: 0 % (ref 0.0–0.2)

## 2024-02-07 LAB — BASIC METABOLIC PANEL WITH GFR
Anion gap: 10 (ref 5–15)
BUN: 15 mg/dL (ref 8–23)
CO2: 26 mmol/L (ref 22–32)
Calcium: 8.4 mg/dL — ABNORMAL LOW (ref 8.9–10.3)
Chloride: 103 mmol/L (ref 98–111)
Creatinine, Ser: 0.71 mg/dL (ref 0.44–1.00)
GFR, Estimated: >60 mL/min (ref >60)
Glucose, Bld: 107 mg/dL — ABNORMAL HIGH (ref 70–99)
Potassium: 3.4 mmol/L — ABNORMAL LOW (ref 3.5–5.1)
Sodium: 139 mmol/L (ref 135–145)

## 2024-02-07 MED ORDER — DILTIAZEM HCL 30 MG PO TABS
30.0000 mg | ORAL_TABLET | Freq: Two times a day (BID) | ORAL | Status: DC
  Administered 2024-02-07: 30 mg via ORAL
  Filled 2024-02-07: qty 1

## 2024-02-07 MED ORDER — AMIODARONE IV BOLUS ONLY 150 MG/100ML
INTRAVENOUS | Status: AC
  Filled 2024-02-07: qty 100

## 2024-02-07 MED ORDER — POTASSIUM CHLORIDE CRYS ER 20 MEQ PO TBCR
20.0000 meq | EXTENDED_RELEASE_TABLET | Freq: Three times a day (TID) | ORAL | Status: AC
  Administered 2024-02-07 (×3): 20 meq via ORAL
  Filled 2024-02-07 (×3): qty 1

## 2024-02-07 MED ORDER — METOLAZONE 5 MG PO TABS
2.5000 mg | ORAL_TABLET | Freq: Once | ORAL | Status: AC
  Administered 2024-02-07: 2.5 mg via ORAL
  Filled 2024-02-07: qty 1

## 2024-02-07 MED ORDER — AMIODARONE IV BOLUS ONLY 150 MG/100ML
150.0000 mg | Freq: Once | INTRAVENOUS | Status: AC
  Administered 2024-02-07: 150 mg via INTRAVENOUS

## 2024-02-07 MED ORDER — DILTIAZEM HCL 30 MG PO TABS
30.0000 mg | ORAL_TABLET | Freq: Four times a day (QID) | ORAL | Status: DC
  Administered 2024-02-07 – 2024-02-08 (×4): 30 mg via ORAL
  Filled 2024-02-07 (×4): qty 1

## 2024-02-07 MED ORDER — IRBESARTAN 150 MG PO TABS
150.0000 mg | ORAL_TABLET | Freq: Every day | ORAL | Status: DC
  Administered 2024-02-07 – 2024-02-09 (×3): 150 mg via ORAL
  Filled 2024-02-07 (×3): qty 1

## 2024-02-07 MED ORDER — FUROSEMIDE 40 MG PO TABS
40.0000 mg | ORAL_TABLET | Freq: Two times a day (BID) | ORAL | Status: DC
  Administered 2024-02-07 – 2024-02-09 (×4): 40 mg via ORAL
  Filled 2024-02-07 (×4): qty 1

## 2024-02-07 NOTE — Progress Notes (Signed)
  CARDIAC REHAB PHASE I    PRE:                Rate/Rhythm: NSR 84   BP:                  Sitting:  140/70                                      SaO2: 99% RA    MODE:            Ambulation: 104 ft           POST:             Rate/Rhythm: NSR 99, with PACs   BP:                  Sitting:  178/76                                SaO2: 97% RA   Pt tolerated exercise fair and amb 104 ft with walker independently after set up. Pt denied CP or dizziness throughout walk, c/o dyspnea with exertion.    Pt and spouse were educated on CABG, restrictions post sx, weight restrictions, no baths/daily wash ups, s/s of infections, angina and nitroglycerin  use and when to call 911, risk factors, nutrition, exercise guidelines and recommendations, s/s to stop exercising, and orientation to Cardiac Rehab Phase 2. Pt received materials on open heart sx book, exercise, diet, and CRPII.    Pt left in the chair asymptomatic w/ call bell in reach.   Service time is from 0846 to 9076  Ronal Levin, RN, BSN Cardiac and Pulmonary Rehab

## 2024-02-07 NOTE — Progress Notes (Addendum)
 [address]       [place] [address]             [phone]      6 Days Post-Op Procedure(s) (LRB): CORONARY ARTERY BYPASS GRAFTING X 3, USING LEFT INTERNAL MAMMARY ARTERY AND ENDOSCOPIC HARVESTED RIGHT SAPHENOUS VEIN, ENDARTERECTOMY LEFT ANTERIOR DESCENDING CORONARY ARTERY (N/A) ECHOCARDIOGRAM, TRANSESOPHAGEAL (N/A) Subjective: Transferred from ICU last evening.  Up in the bedside chair, alert and oriented. Pain controlled, no nausea. BM x 2 yesterday.  Said she is getting out of bed and walking to the bathroom on her own.   On RA.   Objective: Vital signs in last 24 hours: Temp:  [97.9 F (36.6 C)-98.3 F (36.8 C)] 98.2 F (36.8 C) (07/16 0356) Pulse Rate:  [69-92] 76 (07/15 2100) Cardiac Rhythm: Normal sinus rhythm (07/16 0152) Resp:  [12-17] 12 (07/16 0356) BP: (114-152)/(58-79) 130/61 (07/16 0356) SpO2:  [95 %-100 %] 95 % (07/15 2100) BM since  Hemodynamic parameters for last 24 hours:    Intake/Output from previous day: 07/15 0701 - 07/16 0700 In: 785.6 [P.O.:720; I.V.:65.6] Out: -  Intake/Output this shift: No intake/output data recorded.  General appearance: alert, cooperative, and no distress Neurologic: intact Heart: RRR, ? Pericardial rub. No atrial fibrillation since transfer to 4E last night.  Lungs: normal work of breathing on RA. Breath sounds are clear. Abdomen: soft, no tenderness Extremities: mild peripheral edema. The RLE EVH incision is intact and dry Wound: The sternotomy incision is bruised but well approximated and dry.   Lab Results: Recent Labs    02/06/24 0346 02/07/24 0333  WBC 10.7* 9.0  HGB 8.7* 8.4*  HCT 27.0* 25.7*  PLT 195 215   BMET:  Recent Labs    02/07/24 0333 02/07/24 0334  NA 139 138  K 3.4* 3.4*  CL 103 102  CO2 26 26  GLUCOSE 107* 105*  BUN 15 15  CREATININE 0.71 0.73  CALCIUM  8.4* 8.3*    PT/INR: No results for input(s): LABPROT, INR in the last 72 hours. ABG    Component  Value Date/Time   PHART 7.302 (L) 02/02/2024 0454   HCO3 22.2 02/02/2024 0454   TCO2 24 02/02/2024 0454   ACIDBASEDEF 4.0 (H) 02/02/2024 0454   O2SAT 96 02/02/2024 0454   CBG (last 3)  No results for input(s): GLUCAP in the last 72 hours.  Assessment/Plan: S/P Procedure(s) (LRB): CORONARY ARTERY BYPASS GRAFTING X 3, USING LEFT INTERNAL MAMMARY ARTERY AND ENDOSCOPIC HARVESTED RIGHT SAPHENOUS VEIN, ENDARTERECTOMY LEFT ANTERIOR DESCENDING CORONARY ARTERY (N/A) ECHOCARDIOGRAM, TRANSESOPHAGEAL (N/A)  -POD6 CABG x 3 with endarterectomy for severe complex LAD disease.  EF 40-45% on post-op echo yesterday. On ASA, Plavix , Toprol  XL50, Crestor .  BP 130-150's.  Will resume ARB at half of her ususal dose.    -Post-op atrial fibrillation- Converted back to SR on IV amiodarone , now on oral amio. K+ 3.4, will supplement today.   -RENAL- normal function at baseline. Not weighed this morning but was ~6kg+ yesterday. Will increase the Lasix  to 40mg  BID and add one dose Zaroxolyn  today.   -GI- tolerating PO's and having BM's.   -PULM- normal resp effort on RA.  Encouraging pulm hygiene. Follow up CXR in am to reassess left basilar volume loss.   -ENDO- no h/o DM.   -History of anxiety- controlled won her Lexapro , PRN Xanax .   -DVT PPX- ambulating, on daily enoxaparin .   -Disposition-progressing with mobility. Will ask PT and OT to assess for  discharge needs. Anticipate she will be ready for discharge to home in 1-2 days if rhythm stable.    LOS: 6 days   Myron G. Roddenberry, PA-C 02/07/2024  Agree Stable rhythm Dispo planning  Ramelo Oetken O Jonnell Hentges

## 2024-02-07 NOTE — Progress Notes (Signed)
 Patient walking with mobility, HR increase to 150, maintained in 140's. EKG confirmed a-fib/rvr. PA notified, new orders placed

## 2024-02-07 NOTE — Progress Notes (Signed)
 Mobility Specialist Progress Note:   02/07/24 1123  Mobility  Activity Ambulated with assistance in room;Ambulated with assistance in hallway  Level of Assistance Contact guard assist, steadying assist  Assistive Device Front wheel walker  Distance Ambulated (ft) 100 ft  RUE Weight Bearing Per Provider Order NWB  LUE Weight Bearing Per Provider Order NWB  Activity Response Tolerated well  Mobility Referral Yes  Mobility visit 1 Mobility  Mobility Specialist Start Time (ACUTE ONLY) 1108  Mobility Specialist Stop Time (ACUTE ONLY) 1120  Mobility Specialist Time Calculation (min) (ACUTE ONLY) 12 min   Pt received in chair, agreeable to mobility session. MinG for safety with RW. Max HR 191 bpm. RN notified. Pt denies dizziness and CP, c/o fatigue and feeling winded. Immediately returned pt to room, sitting up in chair with all needs met. RN in room.   Izzabell Klasen Mobility Specialist Please contact via Special educational needs teacher or  Rehab office at [phone]

## 2024-02-07 NOTE — Care Management Important Message (Signed)
 Important Message  Patient Details  Name: Christine Hart MRN: [id number] Date of Birth: Dec 25, 1945   Important Message Given:  Yes - Medicare IM     Claretta Deed 02/07/2024, 11:16 AM

## 2024-02-07 NOTE — Progress Notes (Addendum)
 PT Cancellation Note  Patient Details Name: Christine Hart MRN: [id number] DOB: [birth year]   Cancelled Treatment:    Reason Eval/Treat Not Completed: Medical issues which prohibited therapy. Upon my arrival to the unit, pt with onset afib with RVR and HR sustaining 130-145 while at rest after working with mobility specialist. RN asked PT to hold evaluation until pt assessed and HR and rhythm controlled.   Izetta Call, PT, DPT   Acute Rehabilitation Department Office [phone] Secure Chat Communication Preferred   Izetta JULIANNA Call 02/07/2024, 11:42 AM

## 2024-02-07 NOTE — Progress Notes (Signed)
 Patient noted to be NSR on monitor now , HR 68

## 2024-02-08 ENCOUNTER — Inpatient Hospital Stay (HOSPITAL_COMMUNITY)

## 2024-02-08 LAB — RENAL FUNCTION PANEL
Albumin: 3.4 g/dL — ABNORMAL LOW (ref 3.5–5.0)
Anion gap: 10 (ref 5–15)
BUN: 12 mg/dL (ref 8–23)
CO2: 27 mmol/L (ref 22–32)
Calcium: 9.1 mg/dL (ref 8.9–10.3)
Chloride: 98 mmol/L (ref 98–111)
Creatinine, Ser: 0.79 mg/dL (ref 0.44–1.00)
GFR, Estimated: >60 mL/min (ref >60)
Glucose, Bld: 113 mg/dL — ABNORMAL HIGH (ref 70–99)
Phosphorus: 2.5 mg/dL (ref 2.5–4.6)
Potassium: 3.4 mmol/L — ABNORMAL LOW (ref 3.5–5.1)
Sodium: 135 mmol/L (ref 135–145)

## 2024-02-08 LAB — MAGNESIUM: Magnesium: 2.1 mg/dL (ref 1.7–2.4)

## 2024-02-08 LAB — CBC
HCT: 27.3 % — ABNORMAL LOW (ref 36.0–46.0)
Hemoglobin: 9 g/dL — ABNORMAL LOW (ref 12.0–15.0)
MCH: 30.1 pg (ref 26.0–34.0)
MCHC: 33 g/dL (ref 30.0–36.0)
MCV: 91.3 fL (ref 80.0–100.0)
Platelets: 267 K/uL (ref 150–400)
RBC: 2.99 MIL/uL — ABNORMAL LOW (ref 3.87–5.11)
RDW: 14 % (ref 11.5–15.5)
WBC: 11.2 K/uL — ABNORMAL HIGH (ref 4.0–10.5)
nRBC: 0 % (ref 0.0–0.2)

## 2024-02-08 MED ORDER — AMIODARONE IV BOLUS ONLY 150 MG/100ML
150.0000 mg | Freq: Once | INTRAVENOUS | Status: AC
  Administered 2024-02-08: 150 mg via INTRAVENOUS
  Filled 2024-02-08: qty 100

## 2024-02-08 MED ORDER — DILTIAZEM HCL 30 MG PO TABS
30.0000 mg | ORAL_TABLET | Freq: Four times a day (QID) | ORAL | Status: DC
  Administered 2024-02-08 – 2024-02-09 (×2): 30 mg via ORAL
  Filled 2024-02-08 (×2): qty 1

## 2024-02-08 MED ORDER — POTASSIUM CHLORIDE CRYS ER 20 MEQ PO TBCR
40.0000 meq | EXTENDED_RELEASE_TABLET | Freq: Two times a day (BID) | ORAL | Status: AC
  Administered 2024-02-08 – 2024-02-09 (×3): 40 meq via ORAL
  Filled 2024-02-08 (×3): qty 2

## 2024-02-08 MED ORDER — METOLAZONE 5 MG PO TABS
2.5000 mg | ORAL_TABLET | Freq: Once | ORAL | Status: AC
  Administered 2024-02-08: 2.5 mg via ORAL
  Filled 2024-02-08: qty 1

## 2024-02-08 MED FILL — Sodium Chloride IV Soln 0.9%: INTRAVENOUS | Qty: 2000 | Status: AC

## 2024-02-08 MED FILL — Electrolyte-R (PH 7.4) Solution: INTRAVENOUS | Qty: 5000 | Status: AC

## 2024-02-08 MED FILL — Mannitol IV Soln 20%: INTRAVENOUS | Qty: 500 | Status: AC

## 2024-02-08 MED FILL — Heparin Sodium (Porcine) Inj 1000 Unit/ML: Qty: 1000 | Status: AC

## 2024-02-08 MED FILL — Potassium Chloride Inj 2 mEq/ML: INTRAVENOUS | Qty: 40 | Status: AC

## 2024-02-08 MED FILL — Sodium Bicarbonate IV Soln 8.4%: INTRAVENOUS | Qty: 50 | Status: AC

## 2024-02-08 MED FILL — Heparin Sodium (Porcine) Inj 1000 Unit/ML: INTRAMUSCULAR | Qty: 10 | Status: AC

## 2024-02-08 MED FILL — Lidocaine HCl Local Preservative Free (PF) Inj 2%: INTRAMUSCULAR | Qty: 14 | Status: AC

## 2024-02-08 MED FILL — Calcium Chloride Inj 10%: INTRAVENOUS | Qty: 10 | Status: AC

## 2024-02-08 NOTE — Evaluation (Signed)
 Occupational Therapy Evaluation Patient Details Name: Christine Hart MRN: [id number] DOB: Dec 07, 1945 Today's Date: 02/08/2024   History of Present Illness   The pt is a 78 yo female presenting 7/10 for CABG x3 with coronary endarterectomy complicated by intra op, LAD noted to be heavily calcified with initial anastomosis not hemostatic after removing cross clamp requiring aortic reclamping, endarterectomy and LIMA connected with resultant hemostasis and transition off CPB. PMH includes: CAD, mitral valve prolapse, SVT, HTN, HLD, arthritis, back pain, and anxiety.     Clinical Impressions Pt admitted based on above, and was seen based on problem list below. PTA pt was independent with ADLs and IADLs. Today pt is requiring s for safety with sternal precautions for ADLs. Functional transfers are  s for safety with use of RW. Pt with good recall of sternal precautions, and good ability to maintain them functionally. Reviewed energy conservation strategies with pt and spouse, pt verbalizing understanding. Pt with adequate family support available upon d/c, no follow up OT needs. DME below. OT will continue to follow acutely to maximize functional independence.     If plan is discharge home, recommend the following:   A little help with walking and/or transfers;A little help with bathing/dressing/bathroom;Assistance with cooking/housework     Functional Status Assessment   Patient has had a recent decline in their functional status and demonstrates the ability to make significant improvements in function in a reasonable and predictable amount of time.     Equipment Recommendations   Tub/shower seat     Recommendations for Other Services         Precautions/Restrictions   Precautions Precautions: Fall;Sternal Precaution Booklet Issued: Yes (comment) Recall of Precautions/Restrictions: Intact Restrictions Weight Bearing Restrictions Per Provider Order: No Other  Position/Activity Restrictions: Sternal precautions     Mobility Bed Mobility     General bed mobility comments: Received in recliner    Transfers Overall transfer level: Needs assistance Equipment used: Rolling walker (2 wheels) Transfers: Sit to/from Stand, Bed to chair/wheelchair/BSC Sit to Stand: Contact guard assist     Step pivot transfers: Supervision     General transfer comment: Rocking for momentum for STS, good hand placement, CGA for inital balance      Balance Overall balance assessment: Mild deficits observed, not formally tested       ADL either performed or assessed with clinical judgement   ADL Overall ADL's : Needs assistance/impaired Eating/Feeding: Set up;Sitting   Grooming: Oral care;Supervision/safety;Standing           Upper Body Dressing : Sitting;Supervision/safety   Lower Body Dressing: Supervision/safety;Sit to/from stand   Toilet Transfer: Supervision/safety;Ambulation;Rolling walker (2 wheels) Toilet Transfer Details (indicate cue type and reason): Simulated in room Toileting- Clothing Manipulation and Hygiene: Supervision/safety;Sit to/from stand       Functional mobility during ADLs: Supervision/safety;Rolling walker (2 wheels) General ADL Comments: Educated pt on compensatory strategies for ADLs while maintaing sternal precautions     Vision Baseline Vision/History: 1 Wears glasses Vision Assessment?: No apparent visual deficits            Pertinent Vitals/Pain Pain Assessment Pain Assessment: Faces Faces Pain Scale: Hurts a little bit Pain Location: Incision site Pain Descriptors / Indicators: Discomfort Pain Intervention(s): Monitored during session     Extremity/Trunk Assessment Upper Extremity Assessment Upper Extremity Assessment: Overall WFL for tasks assessed   Lower Extremity Assessment Lower Extremity Assessment: Defer to PT evaluation   Cervical / Trunk Assessment Cervical / Trunk Assessment:  Normal   Communication  Communication Communication: No apparent difficulties   Cognition Arousal: Alert Behavior During Therapy: WFL for tasks assessed/performed Cognition: No apparent impairments     Following commands: Intact       Cueing  General Comments   Cueing Techniques: Verbal cues;Visual cues  HR sustaining in 90s           Home Living Family/patient expects to be discharged to:: Private residence Living Arrangements: Spouse/significant other Available Help at Discharge: Family;Available 24 hours/day Type of Home: House Home Access: Stairs to enter [employer] of Steps: 3 Entrance Stairs-Rails: None Home Layout: One level     Bathroom Shower/Tub: Producer, television/film/video: Handicapped height Bathroom Accessibility: Yes How Accessible: Accessible via walker Home Equipment: Cane - single point;Rollator (4 wheels);Toilet riser          Prior Functioning/Environment Prior Level of Function : Independent/Modified Independent;Driving        OT Problem List: Decreased strength;Decreased range of motion;Decreased activity tolerance;Impaired balance (sitting and/or standing);Decreased safety awareness;Decreased knowledge of use of DME or AE;Cardiopulmonary status limiting activity   OT Treatment/Interventions: Self-care/ADL training;Therapeutic exercise;Energy conservation;DME and/or AE instruction;Therapeutic activities;Patient/family education;Balance training      OT Goals(Current goals can be found in the care plan section)   Acute Rehab OT Goals Patient Stated Goal: To get better OT Goal Formulation: With patient Time For Goal Achievement: 02/22/24 Potential to Achieve Goals: Good   OT Frequency:  Min 2X/week       AM-PAC OT 6 Clicks Daily Activity     Outcome Measure Help from another person eating meals?: None Help from another person taking care of personal grooming?: A Little Help from another person toileting,  which includes using toliet, bedpan, or urinal?: A Little Help from another person bathing (including washing, rinsing, drying)?: A Little Help from another person to put on and taking off regular upper body clothing?: A Little Help from another person to put on and taking off regular lower body clothing?: A Little 6 Click Score: 19   End of Session Equipment Utilized During Treatment: Rolling walker (2 wheels);Gait belt Nurse Communication: Mobility status  Activity Tolerance: Patient tolerated treatment well Patient left: in chair;with call bell/phone within reach;with family/visitor present  OT Visit Diagnosis: Other abnormalities of gait and mobility (R26.89);Unsteadiness on feet (R26.81);Muscle weakness (generalized) (M62.81)                Time: 9099-9080 OT Time Calculation (min): 19 min Charges:  OT General Charges $OT Visit: 1 Visit OT Evaluation $OT Eval Moderate Complexity: 1 Mod  Larico Dimock C, OT  Acute Rehabilitation Services Office [phone] Secure chat preferred   Adrianne GORMAN Savers 02/08/2024, 10:20 AM

## 2024-02-08 NOTE — Progress Notes (Addendum)
 [address]       [place] [address]             [phone]      7 Days Post-Op Procedure(s) (LRB): CORONARY ARTERY BYPASS GRAFTING X 3, USING LEFT INTERNAL MAMMARY ARTERY AND ENDOSCOPIC HARVESTED RIGHT SAPHENOUS VEIN, ENDARTERECTOMY LEFT ANTERIOR DESCENDING CORONARY ARTERY (N/A) ECHOCARDIOGRAM, TRANSESOPHAGEAL (N/A) Subjective:  Appetite still poor, no nausea or abd pain.  Only walked about 100' with mobility team and a episode of recurrent atrial fibrillation interrupted PT evaluation.   On RA.   Objective: Vital signs in last 24 hours: Temp:  [97.6 F (36.4 C)-98.8 F (37.1 C)] 98.8 F (37.1 C) (07/17 0310) Pulse Rate:  [68-143] 70 (07/17 0310) Cardiac Rhythm: Normal sinus rhythm (07/16 1900) Resp:  [15-21] 20 (07/17 0310) BP: (125-163)/(66-92) 139/66 (07/17 0310) SpO2:  [94 %-99 %] 94 % (07/17 0310) Weight:  [81.6 kg] 81.6 kg (07/17 0500) BM since  Hemodynamic parameters for last 24 hours:    Intake/Output from previous day: 07/16 0701 - 07/17 0700 In: 100 [I.V.:100] Out: -  Intake/Output this shift: No intake/output data recorded.  General appearance: alert, cooperative, and no distress Neurologic: intact Heart: Currently in SR but has been in ald out of atrial fibrillation with RVR yesterday and this morning.   Lungs: normal work of breathing on RA. Breath sounds are clear. Abdomen: soft, no tenderness Extremities: mild peripheral edema. The RLE EVH incision is intact and dry Wound: The sternotomy incision is well approximated and dry.   Lab Results: Recent Labs    02/07/24 0333 02/08/24 0324  WBC 9.0 11.2*  HGB 8.4* 9.0*  HCT 25.7* 27.3*  PLT 215 267   BMET:  Recent Labs    02/07/24 0334 02/08/24 0324  NA 138 135  K 3.4* 3.4*  CL 102 98  CO2 26 27  GLUCOSE 105* 113*  BUN 15 12  CREATININE 0.73 0.79  CALCIUM  8.3* 9.1    PT/INR: No results for input(s): LABPROT, INR in the last 72 hours. ABG    Component  Value Date/Time   PHART 7.302 (L) 02/02/2024 0454   HCO3 22.2 02/02/2024 0454   TCO2 24 02/02/2024 0454   ACIDBASEDEF 4.0 (H) 02/02/2024 0454   O2SAT 96 02/02/2024 0454   CBG (last 3)  No results for input(s): GLUCAP in the last 72 hours.  Assessment/Plan: S/P Procedure(s) (LRB): CORONARY ARTERY BYPASS GRAFTING X 3, USING LEFT INTERNAL MAMMARY ARTERY AND ENDOSCOPIC HARVESTED RIGHT SAPHENOUS VEIN, ENDARTERECTOMY LEFT ANTERIOR DESCENDING CORONARY ARTERY (N/A) ECHOCARDIOGRAM, TRANSESOPHAGEAL (N/A)  -POD7 CABG x 3 with endarterectomy for severe complex LAD disease with an ungraftable distal stenosis.  EF 40-45% with expected apical hypokinesis on post-op echo yesterday. On ASA, Plavix , Toprol  XL50, Crestor .  BP 130-160's.  Resumed ARB at half of her ususal dose yesterday. Continue current Rx today.   -Post-op atrial fibrillation- Initially converted back to SR on IV amiodarone  prior to transfer out of the ICU, but has had several recurrent episodes.  Received and additional bolus of amiodarone  150mg  IV yesterday and was started on oral diltiazem . Will repeat and amiodarone  bolus this morning. K+ 3.4 after 60mEq KCl yesterday, will supplement again today. Mg++ 2.1. Discussed plan for anticoagulation with Dr. Shyrl, hill hold off on DOAC for now. Continue ASA and Plavix .   -RENAL- normal function at baseline. Down less than 1kg from yesterday, still ~5kg+. Will continue the Lasix  to 40mg  BID and repeat one dose Zaroxolyn   today.   -GI- tolerating PO's and having BM's.   -PULM- normal resp effort on RA.  Encouraging pulm hygiene. CXR this AM showing a moderate left effusion. Diurese.  -ENDO- no h/o DM.   -History of anxiety- controlled won her Lexapro , PRN Xanax .   -DVT PPX- ambulating, on daily enoxaparin .   -Disposition- Slowly progressing with mobility.  PT and OT assessments pending.  Needs more diuresis, mobility and better rhythm control before discharge.   LOS: 7 days   Myron  G. Roddenberry, PA-C 02/08/2024  Agree Will hold off on eliquis Dispo planning  Rodolph Hagemann O Purvi Ruehl

## 2024-02-08 NOTE — Progress Notes (Signed)
 Physical Therapy Treatment Patient Details Name: Christine Hart MRN: [id number] DOB: 02/25/1946 Today's Date: 02/08/2024   History of Present Illness The pt is a 78 yo female presenting 7/10 for CABG x3 with coronary endarterectomy complicated by intra op, LAD noted to be heavily calcified with initial anastomosis not hemostatic after removing cross clamp requiring aortic reclamping, endarterectomy and LIMA connected with resultant hemostasis and transition off CPB. PMH includes: CAD, mitral valve prolapse, SVT, HTN, HLD, arthritis, back pain, and anxiety.    PT Comments  Ambulating with rollator for light support and efficiency up to 100 feet today. Stood 4 times from recliner while safely maintaining sternal precautions. She did complain of dizziness and feeling flush each time she stood. HR remained 80s-90s with a drop and rebound in BP. (Seated BP 142/75, HR 85; standing BP 108/79, HR 96; standing BP at 3 minutes 126/61, HR 96. Educated on symptom awareness and safety. Patient will continue to benefit from skilled physical therapy services to further improve independence with functional mobility.     If plan is discharge home, recommend the following: A little help with walking and/or transfers;A little help with bathing/dressing/bathroom;Assistance with cooking/housework;Assist for transportation;Help with stairs or ramp for entrance   Can travel by private vehicle        Equipment Recommendations  None recommended by PT    Recommendations for Other Services       Precautions / Restrictions Precautions Precautions: Fall;Sternal Precaution Booklet Issued: Yes (comment) Recall of Precautions/Restrictions: Intact Restrictions Other Position/Activity Restrictions: Sternal precautions     Mobility  Bed Mobility               General bed mobility comments: Received in recliner    Transfers Overall transfer level: Needs assistance Equipment used: Rollator (4  wheels) Transfers: Sit to/from Stand Sit to Stand: Supervision           General transfer comment: Reviewed set-up with brake check, safely maintains sternal precautions to rise. No physical assist. Performed 4x from recliner. Dizziness with each attempt    Ambulation/Gait Ambulation/Gait assistance: Supervision Gait Distance (Feet): 100 Feet Assistive device: Rollator (4 wheels) Gait Pattern/deviations: Step-through pattern, Decreased stride length Gait velocity: dec Gait velocity interpretation: <1.8 ft/sec, indicate of risk for recurrent falls   General Gait Details: Grossly stable with Rollator for light support. Maintains sternal precautions. No overt LOB. Mild dyspnea SpO2 95% and greater on room air. HR in 90s. Pt does fatigue easily.   Stairs             Wheelchair Mobility     Tilt Bed    Modified Rankin (Stroke Patients Only)       Balance Overall balance assessment: Mild deficits observed, not formally tested                                          Communication Communication Communication: No apparent difficulties  Cognition Arousal: Alert Behavior During Therapy: WFL for tasks assessed/performed   PT - Cognitive impairments: No apparent impairments                         Following commands: Intact      Cueing Cueing Techniques: Verbal cues  Exercises      General Comments General comments (skin integrity, edema, etc.): Dizzy upon standing - feeling flush and lightheaded each time she  stood with little improvement. RN notified. Seated BP 142/75 HR 85, standing BP 108/79 HR 96, standing at 3 min 126/61 HR 96.      Pertinent Vitals/Pain Pain Assessment Pain Assessment: Faces Faces Pain Scale: Hurts a little bit Pain Location: Incision site Pain Descriptors / Indicators: Discomfort Pain Intervention(s): Monitored during session, Limited activity within patient's tolerance    Home Living Family/patient  expects to be discharged to:: Private residence Living Arrangements: Spouse/significant other Available Help at Discharge: Family;Available 24 hours/day Type of Home: House Home Access: Stairs to enter Entrance Stairs-Rails: None Entrance Stairs-Number of Steps: 3   Home Layout: One level Home Equipment: Cane - single point;Rollator (4 wheels);Toilet riser      Prior Function            PT Goals (current goals can now be found in the care plan section) Acute Rehab PT Goals Patient Stated Goal: Get well PT Goal Formulation: With patient Time For Goal Achievement: 02/22/24 Potential to Achieve Goals: Good    Frequency    Min 2X/week      PT Plan      Co-evaluation              AM-PAC PT 6 Clicks Mobility   Outcome Measure  Help needed turning from your back to your side while in a flat bed without using bedrails?: None Help needed moving from lying on your back to sitting on the side of a flat bed without using bedrails?: A Little Help needed moving to and from a bed to a chair (including a wheelchair)?: A Little Help needed standing up from a chair using your arms (e.g., wheelchair or bedside chair)?: A Little Help needed to walk in hospital room?: A Little Help needed climbing 3-5 steps with a railing? : A Little 6 Click Score: 19    End of Session Equipment Utilized During Treatment: Gait belt Activity Tolerance: Other (comment) (Dizziness) Patient left: in chair;with call bell/phone within reach;with family/visitor present Nurse Communication: Mobility status (dizziness) PT Visit Diagnosis: Unsteadiness on feet (R26.81);Other abnormalities of gait and mobility (R26.89);Muscle weakness (generalized) (M62.81);Pain Pain - part of body:  (sternum)     Time: 8854-8786 PT Time Calculation (min) (ACUTE ONLY): 28 min  Charges:    $Gait Training: 8-22 mins PT General Charges $$ ACUTE PT VISIT: 1 Visit                     Leontine Roads, PT, DPT [facility]  Health  Rehabilitation Services Physical Therapist Office: [phone] Website: [email or web address]    Leontine GORMAN Roads 02/08/2024, 3:03 PM

## 2024-02-08 NOTE — Progress Notes (Signed)
 Mobility Specialist Progress Note;   02/08/24 1104  Mobility  Activity Ambulated with assistance to bathroom;Ambulated with assistance in room  Level of Assistance Standby assist, set-up cues, supervision of patient - no hands on  Assistive Device Other (Comment) (IV pole)  Distance Ambulated (ft) 7 ft  RUE Weight Bearing Per Provider Order NWB  LUE Weight Bearing Per Provider Order NWB  Activity Response Tolerated well  Mobility Referral Yes  Mobility visit 1 Mobility  Mobility Specialist Start Time (ACUTE ONLY) 1104  Mobility Specialist Stop Time (ACUTE ONLY) 1109  Mobility Specialist Time Calculation (min) (ACUTE ONLY) 5 min   Answered pts call bell requesting assistance form BR. Required little to no physical assistance as pt pushed IV pole. HR up to 99 bpm, c/o feeling winded. Pt returned back to chair and left with all needs met. Husband present.   Lauraine Erm Mobility Specialist Please contact via SecureChat or [employer] [phone]

## 2024-02-08 NOTE — Plan of Care (Signed)
  Problem: Education: Goal: Knowledge of General Education information will improve Description: Including pain rating scale, medication(s)/side effects and non-pharmacologic comfort measures Outcome: Progressing   Problem: Clinical Measurements: Goal: Ability to maintain clinical measurements within normal limits will improve Outcome: Progressing Goal: Will remain free from infection Outcome: Progressing Goal: Diagnostic test results will improve Outcome: Progressing Goal: Respiratory complications will improve Outcome: Progressing Goal: Cardiovascular complication will be avoided Outcome: Progressing   Problem: Activity: Goal: Risk for activity intolerance will decrease Outcome: Progressing   Problem: Nutrition: Goal: Adequate nutrition will be maintained Outcome: Progressing   Problem: Coping: Goal: Level of anxiety will decrease Outcome: Progressing   Problem: Elimination: Goal: Will not experience complications related to bowel motility Outcome: Progressing Goal: Will not experience complications related to urinary retention Outcome: Progressing   Problem: Pain Managment: Goal: General experience of comfort will improve and/or be controlled Outcome: Progressing   Problem: Safety: Goal: Ability to remain free from injury will improve Outcome: Progressing   Problem: Skin Integrity: Goal: Risk for impaired skin integrity will decrease Outcome: Progressing   Problem: Education: Goal: Will demonstrate proper wound care and an understanding of methods to prevent future damage Outcome: Progressing Goal: Knowledge of disease or condition will improve Outcome: Progressing Goal: Knowledge of the prescribed therapeutic regimen will improve Outcome: Progressing   Problem: Activity: Goal: Risk for activity intolerance will decrease Outcome: Progressing   Problem: Cardiac: Goal: Will achieve and/or maintain hemodynamic stability Outcome: Progressing   Problem:  Clinical Measurements: Goal: Postoperative complications will be avoided or minimized Outcome: Progressing   Problem: Respiratory: Goal: Respiratory status will improve Outcome: Progressing   Problem: Skin Integrity: Goal: Wound healing without signs and symptoms of infection Outcome: Progressing Goal: Risk for impaired skin integrity will decrease Outcome: Progressing   Problem: Urinary Elimination: Goal: Ability to achieve and maintain adequate renal perfusion and functioning will improve Outcome: Progressing   Problem: Education: Goal: Will demonstrate proper wound care and an understanding of methods to prevent future damage Outcome: Progressing Goal: Knowledge of disease or condition will improve Outcome: Progressing Goal: Knowledge of the prescribed therapeutic regimen will improve Outcome: Progressing   Problem: Activity: Goal: Risk for activity intolerance will decrease Outcome: Progressing   Problem: Cardiac: Goal: Will achieve and/or maintain hemodynamic stability Outcome: Progressing   Problem: Clinical Measurements: Goal: Postoperative complications will be avoided or minimized Outcome: Progressing   Problem: Respiratory: Goal: Respiratory status will improve Outcome: Progressing   Problem: Skin Integrity: Goal: Wound healing without signs and symptoms of infection Outcome: Progressing Goal: Risk for impaired skin integrity will decrease Outcome: Progressing   Problem: Urinary Elimination: Goal: Ability to achieve and maintain adequate renal perfusion and functioning will improve Outcome: Progressing

## 2024-02-09 ENCOUNTER — Other Ambulatory Visit (HOSPITAL_COMMUNITY): Payer: Self-pay

## 2024-02-09 ENCOUNTER — Telehealth: Payer: Self-pay

## 2024-02-09 DIAGNOSIS — Z951 Presence of aortocoronary bypass graft: Secondary | ICD-10-CM

## 2024-02-09 LAB — CBC
HCT: 28.5 % — ABNORMAL LOW (ref 36.0–46.0)
Hemoglobin: 9.2 g/dL — ABNORMAL LOW (ref 12.0–15.0)
MCH: 29.7 pg (ref 26.0–34.0)
MCHC: 32.3 g/dL (ref 30.0–36.0)
MCV: 91.9 fL (ref 80.0–100.0)
Platelets: 310 K/uL (ref 150–400)
RBC: 3.1 MIL/uL — ABNORMAL LOW (ref 3.87–5.11)
RDW: 14.3 % (ref 11.5–15.5)
WBC: 12.9 K/uL — ABNORMAL HIGH (ref 4.0–10.5)
nRBC: 0 % (ref 0.0–0.2)

## 2024-02-09 LAB — RENAL FUNCTION PANEL
Albumin: 3.4 g/dL — ABNORMAL LOW (ref 3.5–5.0)
Anion gap: 10 (ref 5–15)
BUN: 15 mg/dL (ref 8–23)
CO2: 30 mmol/L (ref 22–32)
Calcium: 9.3 mg/dL (ref 8.9–10.3)
Chloride: 97 mmol/L — ABNORMAL LOW (ref 98–111)
Creatinine, Ser: 1 mg/dL (ref 0.44–1.00)
GFR, Estimated: 58 mL/min — ABNORMAL LOW (ref >60)
Glucose, Bld: 113 mg/dL — ABNORMAL HIGH (ref 70–99)
Phosphorus: 3.5 mg/dL (ref 2.5–4.6)
Potassium: 3.5 mmol/L (ref 3.5–5.1)
Sodium: 137 mmol/L (ref 135–145)

## 2024-02-09 MED ORDER — POTASSIUM CHLORIDE CRYS ER 20 MEQ PO TBCR
20.0000 meq | EXTENDED_RELEASE_TABLET | Freq: Every day | ORAL | 0 refills | Status: DC
  Filled 2024-02-09: qty 7, 7d supply, fill #0

## 2024-02-09 MED ORDER — CLOPIDOGREL BISULFATE 75 MG PO TABS
75.0000 mg | ORAL_TABLET | Freq: Every day | ORAL | 3 refills | Status: AC
  Filled 2024-02-09 – 2024-04-29 (×3): qty 90, 90d supply, fill #0
  Filled 2024-08-11: qty 90, 90d supply, fill #1

## 2024-02-09 MED ORDER — POTASSIUM CHLORIDE CRYS ER 20 MEQ PO TBCR
20.0000 meq | EXTENDED_RELEASE_TABLET | Freq: Every day | ORAL | 0 refills | Status: DC
  Filled 2024-02-09: qty 5, 5d supply, fill #0

## 2024-02-09 MED ORDER — METOPROLOL SUCCINATE ER 50 MG PO TB24
50.0000 mg | ORAL_TABLET | Freq: Every day | ORAL | 2 refills | Status: DC
  Filled 2024-02-09 – 2024-03-04 (×3): qty 30, 30d supply, fill #0
  Filled 2024-04-01: qty 30, 30d supply, fill #1

## 2024-02-09 MED ORDER — FUROSEMIDE 40 MG PO TABS
40.0000 mg | ORAL_TABLET | Freq: Every day | ORAL | 0 refills | Status: DC
  Filled 2024-02-09: qty 7, 7d supply, fill #0

## 2024-02-09 MED ORDER — AMIODARONE HCL 200 MG PO TABS
400.0000 mg | ORAL_TABLET | Freq: Two times a day (BID) | ORAL | 1 refills | Status: DC
  Filled 2024-02-09 – 2024-03-04 (×3): qty 60, 25d supply, fill #0

## 2024-02-09 MED ORDER — TRAMADOL HCL 50 MG PO TABS
50.0000 mg | ORAL_TABLET | Freq: Four times a day (QID) | ORAL | 0 refills | Status: AC | PRN
  Filled 2024-02-09: qty 28, 7d supply, fill #0

## 2024-02-09 NOTE — Progress Notes (Addendum)
      [address]       [place] [address]             [phone]      8 Days Post-Op Procedure(s) (LRB): CORONARY ARTERY BYPASS GRAFTING X 3, USING LEFT INTERNAL MAMMARY ARTERY AND ENDOSCOPIC HARVESTED RIGHT SAPHENOUS VEIN, ENDARTERECTOMY LEFT ANTERIOR DESCENDING CORONARY ARTERY (N/A) ECHOCARDIOGRAM, TRANSESOPHAGEAL (N/A) Subjective:  Appetite a little better. No nausea or abd pain.  No further atrial fibrillation.   On RA.   Objective: Vital signs in last 24 hours: Temp:  [97.6 F (36.4 C)-98.4 F (36.9 C)] 97.6 F (36.4 C) (07/18 0314) Pulse Rate:  [70-82] 70 (07/18 0314) Cardiac Rhythm: Normal sinus rhythm (07/17 1915) Resp:  [17-20] 18 (07/18 0524) BP: (106-152)/(65-78) 110/65 (07/18 0525) SpO2:  [94 %-97 %] 94 % (07/18 0314) Weight:  [77.7 kg] 77.7 kg (07/18 0524) BM since    Intake/Output from previous day: 07/17 0701 - 07/18 0700 In: 243 [P.O.:240; I.V.:3] Out: -  Intake/Output this shift: No intake/output data recorded.  General appearance: alert, cooperative, and no distress Neurologic: intact Heart: NSR, no further atrial fibrillation since early yesterday.    Lungs: normal work of breathing on RA. Breath sounds are clear. Abdomen: soft, no tenderness Extremities: peripheral edema has resolved. The RLE EVH incision is intact and dry, expected bruising right thigh Wound: The sternotomy incision is well approximated and dry.   Lab Results: Recent Labs    02/08/24 0324 02/09/24 0246  WBC 11.2* 12.9*  HGB 9.0* 9.2*  HCT 27.3* 28.5*  PLT 267 310   BMET:  Recent Labs    02/08/24 0324 02/09/24 0246  NA 135 137  K 3.4* 3.5  CL 98 97*  CO2 27 30  GLUCOSE 113* 113*  BUN 12 15  CREATININE 0.79 1.00  CALCIUM  9.1 9.3    PT/INR: No results for input(s): LABPROT, INR in the last 72 hours. ABG    Component Value Date/Time   PHART 7.302 (L) 02/02/2024 0454   HCO3 22.2 02/02/2024 0454   TCO2 24 02/02/2024 0454   ACIDBASEDEF  4.0 (H) 02/02/2024 0454   O2SAT 96 02/02/2024 0454   CBG (last 3)  No results for input(s): GLUCAP in the last 72 hours.  Assessment/Plan: S/P Procedure(s) (LRB): CORONARY ARTERY BYPASS GRAFTING X 3, USING LEFT INTERNAL MAMMARY ARTERY AND ENDOSCOPIC HARVESTED RIGHT SAPHENOUS VEIN, ENDARTERECTOMY LEFT ANTERIOR DESCENDING CORONARY ARTERY (N/A) ECHOCARDIOGRAM, TRANSESOPHAGEAL (N/A)  -POD8 CABG x 3 with endarterectomy for severe complex LAD disease with an ungraftable distal stenosis.  EF 40-45% with expected apical hypokinesis on post-op echo yesterday. On ASA, Plavix , Toprol  XL50, Crestor .  BP 110-150's.    -Post-op atrial fibrillation- maintaining SR on oral amiodarone . K+ 3.5 Continue ASA and Plavix .   -RENAL- normal function at baseline. Wt now only 1Kg+. Will continue Lasix  at discharge.   -GI- tolerating PO's and having BM's.   -PULM- normal resp effort on RA.  Encouraging pulm hygiene. CXR showing a moderate left effusion. Continue diurese for now. Recheck on follow up.   -ENDO- no h/o DM.   -History of anxiety- controlled won her Lexapro , PRN Xanax .   -DVT PPX- ambulating, on daily enoxaparin .   -Disposition- Slowly progressing with mobility.  No recommendations for home therapy by PT and OT. Plan for discharge to home today.    LOS: 8 days   Raymund Manrique G. Adonnis Salceda, PA-C 02/09/2024

## 2024-02-09 NOTE — Progress Notes (Signed)
 Discharge instructions reviewed with pt and her husband by OBIE Pride.  Copy of instructions given to pt. [facility] filled scripts and will be picked up on the way out for discharge.  Pt d/c'd via wheelchair with belongings and escorted by staff.   Leandra Vanderweele,RN SWOT

## 2024-02-09 NOTE — TOC Transition Note (Signed)
 Transition of Care (TOC) - Discharge Note Rayfield Gobble RN, BSN Inpatient Care Management Unit 4E- RN Case Manager See Treatment Team for direct phone #   Patient Details  Name: Christine Hart MRN: [id number] Date of Birth: 21-Mar-1946  Transition [facility] CM/SW Contact:  Gobble Rayfield Hurst, RN Phone Number: 02/09/2024, 12:02 PM   Clinical Narrative:    Pt stable for transition home today, Family to transport home. CM was notified by [facility] liaison that TCTS office made referral for any HH needs. [facility] liaison to follow up with pt post discharge  No DME needs noted.      Final next level of care: Home w Home Health Services Barriers to Discharge: Barriers Resolved   Patient Goals and CMS Choice Patient states their goals for this hospitalization and ongoing recovery are:: plans to return home once stable   Choice offered to / list presented to : NA      Discharge Placement               Home        Discharge Plan and Services Additional resources added to the After Visit Summary for   In-house Referral: NA Discharge Planning Services: CM Consult Post Acute Care Choice: Home Health          DME Arranged: N/A DME Agency: NA         HH Agency: [facility] Date HH Agency Contacted: 02/09/24 Time HH Agency Contacted: 0930 Representative spoke with at [facility] Agency: Zebedee  Social Drivers of Health (SDOH) Interventions SDOH Screenings   Food Insecurity: No Food Insecurity (02/03/2024)  Housing: Low Risk  (02/03/2024)  Transportation Needs: No Transportation Needs (02/03/2024)  Utilities: Not At Risk (02/03/2024)  Social Connections: Moderately Integrated (02/03/2024)  Tobacco Use: Low Risk  (02/01/2024)     Readmission Risk Interventions    02/09/2024   12:02 PM  Readmission Risk Prevention Plan  Post Dischage Appt Complete  Medication Screening Complete  Transportation Screening Complete

## 2024-02-09 NOTE — Telephone Encounter (Signed)
 Jon, [facility] with [facility] contacted the office to state patient has requested [facility] to come to the home Tuesday instead of tomorrow. Patient was discharged from the hospital today s/p CABG x3 with Dr. Shyrl.

## 2024-02-11 NOTE — Progress Notes (Signed)
 Cardiology Office Note:    Date:  02/22/2024   ID:  KAYLAN YATES, DOB 08-17-1945, MRN [id number]  PCP:  Street, Lonni HERO, MD  Cardiologist:  Ozell Fell, MD Cardiology APP:  Ting Cage E, PA-C     Referring MD: Street, Lonni HERO, *   Chief Complaint: hospital follow-up after CABG  History of Present Illness:    Christine Hart is a 78 y.o. female with a history of CAD s/p recent CABG x3 (LIMA-LAD, reverse SVG-OM, reverse SVG-Diag) on 7/102025, ischemic cardiomyopathy with EF of 45-50%, post-op atrial fibrillation on Amiodarone , SVT, mild mitral regurgitation, hypertension, hyperlipidemia, arthritis, chronic back pain, and anxiety who presents today for hospital follow-up after CABG.  Patient was first seen by me as a new patient on 12/12/2023 for further evaluation of hypertension. At that time, she reported she was first diagnosed with hypertension about 35 years ago. Her BP had reportedly been as high as 200-220/110 in the past but her BP was doing well on her current medications. However, at that visit, she reported occasional dull chest pain/ pressure at rest that last for 30-45 seconds a time. She also reported a couple of episodes that woke her up from sleep a couple times in the middle of the night. She denied any exertional chest pain. She also reported history a remote history of mitral valve prolapse and SVT but denied any significant palpitations. Coronary CTA and Echo were ordered for further evaluation. Coronary CTA showed a coronary calcium  score of 1,082 and total plaque volume of 761 (85th percentile for age and sex) as well as severe stenosis of proximal/ mid LAD, severe stenosis of ostial D2, moderate stenosis of LCX, and moderate stenosis of OM2. FFR showed lesions in the LAD, D2, and LCX were hemodynamically significant. This led to a cardiac catheterization on 01/15/2024 which showed severe diffuse multivessel disease. She was referred to CT surgery as an  outpatient and CABG was recommended. Echo showed LVEF of 70-75% with normal wall motion and diastolic parameters, normal RV function, and no significant valvular disease.   Patient underwent CABG x3 with LIMA to LAD, reverse SVG to OM, and reverse SVG to Diag on 02/01/2024.  Of note, the 1st anastomosis of the LIMA to LAD was not hemostatic after removing the cross clamp. Therefore, clamp was removed and endarterectomy of the LAD was performed and then the LIMA was connected with a better result. She had post-op atrial fibrillation requiring Amiodarone  with restoration of sinus rhythm and post-bypass vasoplegia that improved with albumin . She also developed a small to moderate left pleural effusion following surgery that was treated with diuretics. Follow-up Echo on 02/06/2024 showed LVEF of 45-50% with hypokinesis of the mid apical anterior wall and apical segment as well as grade 2 diastolic dysfunction, mild MR, and a small pericardial effusion. Mild drop in EF and wall motion abnormality was not unexpected due to complex LAD disease with an un-graftable segment in the distal LAD. She was started on Plavix  given endarterectomy of  LAD. She was discharged on 02/09/2024.  Patient presents today for follow-up. Here with husband.  She feels she is recovering slowly but overall doing well. She does report generalized weakness and she states she feels nervous/ anxious.  However, she denies any chest pain.  She does describe some chest wall soreness but no pain like what she was having before.  She was initially having shortness of breath when walking but states this has significant improved over the last  2 days.  She still feels like she cannot take a deep breath.  No orthopnea or PND.  She describes a little lower extremity swelling but states that it is better than what it is.  She also reports a little dizziness if she stands too quickly but nothing significant.  No palpitations, significant dizziness, or near  syncope/syncope. Weight is stable around 168-170 lbs at home.  She has been walking laps around her living room, dining room, kitchen since surgery and states she will walk anywhere from 12-19 laps a day.  Overall, doing okay with this.  She does have a walker that she uses sometimes but mainly just as a back-up. Because she is nervous about falling. She has not had any actual falls though.  Her BP is mildly elevated in the office today with systolic BP in the 150s. However, her BP is normally well controlled and at goal of <130/80 based on review of home BP log.   EKGs/Labs/Other Studies Reviewed:    The following studies were reviewed:  Coronary CTA 12/25/2023: Impression: 1. Coronary calcium  score is 1082. Unclear race precludes accurate percentile analysis. 2. Total plaque volume (TPV) is 761 mm3, categorized as extensive, which is 85th percentile for age- and sex-matched controls. 3. Normal coronary origins with right dominance. 4. CAD-RADS 4 Severe coronary artery disease. 5. Severe stenosis (70-99%) at the proximal/mid LAD due to mixed plaque. 6. Moderate stenosis (50-69%) due to soft plaque at mid LCX. 7. Moderate stenosis (50-69%) at the ostium of OM2. 8. Severe stenosis (70-99%) due to mixed plaque at the ostial D2 segment. 9. Patent foramen ovale. 10. CT FFR will be performed and reported separately.   FFR Summary: 1. CT FFR analysis showed significant stenosis in proximal / mid LAD and mid/distal LAD (this may be due to both disease burden and vessel tapering), clinical correlation required. 2. CT FFR analysis showed significant stenosis in second diagonal branch. 3.  CT FFR analysis showed significant stenosis in mid LCX.   Recommendations: Recommend invasive angiography if no contraindications, clinical correlation required. _______________   Left Cardiac Catheterization 01/15/2024: 1.  Patent left main with no significant stenosis 2.  Severe diffuse LAD stenosis  with multiple lesions in the proximal, mid, and apical portions.  The LAD wraps around the apex and supplies much of the inferior wall. 3.  Severe mid circumflex and first obtuse marginal stenoses 4.  Moderate to severe proximal and mid RCA stenoses 5.  Vigorous LV systolic function with LVEF greater than 65% and no regional wall motion abnormalities, moderately elevated LVEDP   Reviewed findings with patient.  Recommend outpatient cardiac surgical evaluation for consideration of CABG.  Difficult anatomy with diffuse disease including distal vessel disease.  RCA supplies small territory of myocardium due to large wraparound LAD.  I think her treatment options are likely limited to CABG versus aggressive medical therapy as she has very diffuse disease involvement for PCI.   Diagnostic Dominance: Right  _______________   Complete Echocardiogram 01/18/2024: Impressions: 1. Left ventricular ejection fraction, by estimation, is 70 to 75%. The  left ventricle has hyperdynamic function. The left ventricle has no  regional wall motion abnormalities. Left ventricular diastolic parameters  were normal. The average left  ventricular global longitudinal strain is -27.2 %. The global longitudinal  strain is normal.   2. Right ventricular systolic function is normal. The right ventricular  size is normal.   3. The mitral valve is normal in structure. Trivial mitral valve  regurgitation.  No evidence of mitral stenosis.   4. The aortic valve is tricuspid. There is mild calcification of the  aortic valve. Aortic valve regurgitation is not visualized. Aortic valve  sclerosis/calcification is present, without any evidence of aortic  stenosis.   5. The inferior vena cava is normal in size with greater than 50%  respiratory variability, suggesting right atrial pressure of 3 mmHg.  _______________  Limited Echocardiogram 02/06/2024: Impressions:  1. There is no left ventricular thrombus (Definity  contrast  was used).  Left ventricular ejection fraction, by estimation, is 45 to 50%. The left  ventricle has mildly decreased function. The left ventricle demonstrates  regional wall motion abnormalities  (see scoring diagram/findings for description). Left ventricular diastolic  parameters are consistent with Grade II diastolic dysfunction  (pseudonormalization). There is moderate hypokinesis of the left  ventricular, mid-apical anterior wall and apical  segment.   2. Right ventricular systolic function is normal. The right ventricular  size is normal. The estimated right ventricular systolic pressure is 27.0  mmHg.   3. A small pericardial effusion is present. The pericardial effusion is  localized near the right ventricle. There is no evidence of cardiac  tamponade. Large pleural effusion in the left lateral region.   4. The mitral valve is normal in structure. Mild mitral valve  regurgitation. No evidence of mitral stenosis.   5. The aortic valve is tricuspid. There is mild calcification of the  aortic valve. Aortic valve regurgitation is trivial. Aortic valve  sclerosis/calcification is present, without any evidence of aortic  stenosis.   6. The inferior vena cava is normal in size with greater than 50%  respiratory variability, suggesting right atrial pressure of 3 mmHg.   EKG:  EKG ordered today.   EKG Interpretation Date/Time:  Thursday February 22 2024 10:34:47 EDT Ventricular Rate:  58 PR Interval:  158 QRS Duration:  92 QT Interval:  462 QTC Calculation: 453 R Axis:   -53  Text Interpretation: Sinus bradycardia Left axis deviation Incomplete right bundle branch block Q waves in leads III and V1-V3.  Non-specific T wave changes Confirmed by Kameisha Malicki [phone]) on 02/22/2024 10:43:20 AM    Recent Labs: 01/31/2024: ALT 15 02/08/2024: Magnesium  2.1 02/09/2024: BUN 15; Creatinine, Ser 1.00; Hemoglobin 9.2; Platelets 310; Potassium 3.5; Sodium 137  Recent Lipid Panel No results  found for: CHOL, TRIG, HDL, CHOLHDL, VLDL, LDLCALC, LDLDIRECT  Physical Exam:    Vital Signs: BP (!) 154/76   Pulse (!) 58   Ht 5' 6 (1.676 m)   SpO2 97%   BMI 27.65 kg/m     Wt Readings from Last 3 Encounters:  02/09/24 171 lb 4.8 oz (77.7 kg)  01/31/24 169 lb 4.8 oz (76.8 kg)  01/19/24 171 lb 1.6 oz (77.6 kg)     General: 78 y.o. Caucasian female in no acute distress. HEENT: Normocephalic and atraumatic. Sclera clear.  Neck: Supple. No JVD. Heart: RRR. Distinct S1 and S2. No murmurs, gallops, or rubs.  Lungs: No increased work of breathing. Clear to ausculation bilaterally. No wheezes, rhonchi, or rales.  Extremities: Trace lower extremity edema bilaterally (left leg chronically larger than the right).  Skin: Warm and dry. Neuro: No focal deficits. Psych: Normal affect. Responds appropriately.   Assessment:    1. Chronic heart failure with mildly reduced ejection fraction (HFmrEF, 41-49%) (HCC)   2. Coronary artery disease involving native coronary artery of native heart without angina pectoris   3. S/P CABG (coronary artery bypass graft)   4.  Ischemic cardiomyopathy   5. Pleural effusion on left   6. Post-op atrial fibrillation   7. Paroxysmal SVT (supraventricular tachycardia) (HCC)   8. Mild mitral regurgitation   9. Primary hypertension   10. Hyperlipidemia, unspecified hyperlipidemia type     Plan:    CAD s/p Recent CABG S/p recent CABG x3 with LIMA to LAD, reverse SVG to OM, and reverse SVG to Diag on 02/01/2024. Also underwent endarterectomy of native LAD at that time. - Recovering slowly but overall doing well. She has some chest wall soreness but no real chest pain. - Continue Toprol -XL 50mg  daily.  - Continue DAPT with Aspirin  and Plavix . Of note, she was started on Plavix  due to endarterectomy of LAD at time of CABG. - Continue Crestor  40mg  daily.  - Will check CBC and CMET today. - Recommend cardiac rehab when cleared by CT surgery.  Of  note, she has one suture left on the right side of her neck. She is going to stop by CT Surgery's office on the 4th low on her way out to have this removed.  Ischemic Cardiomyopathy Chronic HFmrEF Repeat Echo on 02/06/2024 after CABG showed LVEF of 45-50% with hypokinesi sof the mid apical anterior wall and apical segment as well as grade 2 diastolic dysfunction, mild MR, and a small pericardial effusion. Mild drop in EF and wall motion abnormality was felt to not be unexpected due to complex LAD disease with an un-graftable segment in the distal LAD. - She does have decreased breath sounds in left base consistent with pleural effusion (see below. However, otherwise, euvolemic on exam.  - She was discharged on Lasix  but states she finished the course of this. Recommended she take Lasix  40mg  (and KCl 20 mEq) PRN for weight gain or edema.  - Continue Telmisartan  80mg  daily.  - Continue Toprol -XL 50mg  daily.  - Discussed importance of daily weights and sodium restrictions.  Left Pericardial Effusion She developed a left pericardial effusion following recent CABG that was managed with diuretics. Last chest x-ray on 02/08/2024 showed stable dependent left pleural effusion with associated left basilar airspace disease.  - She states that she was initially having dyspnea on exertion but states this has significantly improved over the last 2 days. - She continue to have decreased breath sounds in left base.  - Offered repeat chest x-ray but patient declined. I think it is okay to hold off on this and continue with just PRN Lasix  given patient is feeling much better. However, emphasized the importance of letting us  know if dyspnea worsens.   Post-Op Atrial Fibrillation Following recent CABG. Converted to sinus rhythm with Amiodarone .  - Maintaining sinus rhythm. - Continue Toprol -XL 50mg  daily.  - Continue Amiodarone  taper. Plan was for 400mg  twice daily for 1 week followed by 200mg  twice daily for 1 week  and then 200mg  once daily . Can likely stop this after 3 months if no recurrence. - CHA2DS2-VASc = 6 (CAD, CHF, HTN, age x2, female). Not on anticoagulation given this occurred in post-op setting. However, if she has any recurrent documented atrial fibrillation, would recommend starting Eliquis.       History of SVT Patient has a self reported history of SVT that was diagnosed several years ago and she was started on Toprol -XL.   - No palpitations.  - Continue Toprol -XL 50mg  daily.   Mitral Valve Prolapse Mild Mitral Regurgitation Patient has a self reported history of mitral valve prolapse. Echo on 02/06/2024 showed no evidence of mitral valve  prolapse and mild MR. - Can continue to monitor with serial Echos per guidelines.    Hypertension BP mildly elevated. Initially 156/80 and then 154/76 on my personal recheck at the end of the visit. However, BP normally at goal at home. She also describes some lightheadedness/ dizziness with quick positions changes which sounds consistent with orthostasis.  - Continue current medications: Telmisartan  80mg  daily and Toprol -XL 50mg  daily.  - Advised patient to continue to monitor BP at home and let us  know if consistently >130/80.   Hyperlipidemia Per patient report, LDL was around 120 when PCP last checked labs in 08/2023. Unable to see these labs.  - She was started on Crestor  40mg  daily after recent coronary CTA in 12/2023. Continue. - She is due for repeat labs. She is not fasting today but would like to save her neccessary trips to our office. Will repeat direct LDL today (rather than full lipid panel) and CMET today.  Disposition: Follow up in 3 months.   Signed, Aline FORBES Door, PA-C  02/22/2024 12:10 PM    [facility]

## 2024-02-13 ENCOUNTER — Telehealth (HOSPITAL_COMMUNITY): Payer: Self-pay

## 2024-02-13 NOTE — Telephone Encounter (Signed)
 Pt insurance is active and benefits verified through [employer] adv Co-pay $15, DED 0/0 met, out of pocket $3,400/$899.40 met, co-insurance 0%. no pre-authorization required, [employer] 02/13/2024@3 :43, REF# [id number]  How many CR sessions are covered? (ICR)no limit Is this a lifetime maximum or an annual maximum? annual Has the member used any of these services to date? no Is there a time limit (weeks/months) on start of program and/or program completion? no  Will contact patient to see if she is interested in the Cardiac Rehab Program. If interested, patient will need to complete follow up appt. Once completed, patient will be contacted for scheduling upon review by the RN Navigator.

## 2024-02-13 NOTE — Telephone Encounter (Signed)
 Called patient to see if she is interested in the Cardiac Rehab Program. Patient expressed interest. Explained scheduling process and went over insurance, patient verbalized understanding. Will contact patient for scheduling once f/u has been completed.

## 2024-02-22 ENCOUNTER — Ambulatory Visit
Payer: Self-pay | Attending: Thoracic Surgery (Cardiothoracic Vascular Surgery) | Admitting: Thoracic Surgery (Cardiothoracic Vascular Surgery)

## 2024-02-22 ENCOUNTER — Ambulatory Visit: Attending: Student | Admitting: Student

## 2024-02-22 ENCOUNTER — Encounter: Payer: Self-pay | Admitting: Student

## 2024-02-22 ENCOUNTER — Other Ambulatory Visit (HOSPITAL_BASED_OUTPATIENT_CLINIC_OR_DEPARTMENT_OTHER): Payer: Self-pay

## 2024-02-22 VITALS — BP 154/76 | HR 58 | Ht 66.0 in

## 2024-02-22 DIAGNOSIS — I255 Ischemic cardiomyopathy: Secondary | ICD-10-CM

## 2024-02-22 DIAGNOSIS — I5022 Chronic systolic (congestive) heart failure: Secondary | ICD-10-CM

## 2024-02-22 DIAGNOSIS — I340 Nonrheumatic mitral (valve) insufficiency: Secondary | ICD-10-CM

## 2024-02-22 DIAGNOSIS — Z951 Presence of aortocoronary bypass graft: Secondary | ICD-10-CM | POA: Diagnosis not present

## 2024-02-22 DIAGNOSIS — I25119 Atherosclerotic heart disease of native coronary artery with unspecified angina pectoris: Secondary | ICD-10-CM

## 2024-02-22 DIAGNOSIS — E785 Hyperlipidemia, unspecified: Secondary | ICD-10-CM | POA: Diagnosis not present

## 2024-02-22 DIAGNOSIS — I10 Essential (primary) hypertension: Secondary | ICD-10-CM | POA: Diagnosis not present

## 2024-02-22 DIAGNOSIS — I471 Supraventricular tachycardia, unspecified: Secondary | ICD-10-CM | POA: Diagnosis not present

## 2024-02-22 DIAGNOSIS — I480 Paroxysmal atrial fibrillation: Secondary | ICD-10-CM | POA: Diagnosis not present

## 2024-02-22 DIAGNOSIS — I2510 Atherosclerotic heart disease of native coronary artery without angina pectoris: Secondary | ICD-10-CM

## 2024-02-22 DIAGNOSIS — J90 Pleural effusion, not elsewhere classified: Secondary | ICD-10-CM

## 2024-02-22 LAB — LDL CHOLESTEROL, DIRECT

## 2024-02-22 MED ORDER — FUROSEMIDE 40 MG PO TABS: 40.0000 mg | ORAL_TABLET | ORAL | 3 refills | Status: DC | PRN

## 2024-02-22 MED ORDER — POTASSIUM CHLORIDE CRYS ER 20 MEQ PO TBCR
20.0000 meq | EXTENDED_RELEASE_TABLET | ORAL | 3 refills | Status: DC | PRN
Start: 2024-02-22 — End: 2024-02-22

## 2024-02-22 MED ORDER — POTASSIUM CHLORIDE CRYS ER 20 MEQ PO TBCR
20.0000 meq | EXTENDED_RELEASE_TABLET | ORAL | 3 refills | Status: DC | PRN
  Filled 2024-02-22: qty 30, 30d supply, fill #0
  Filled 2024-04-08: qty 30, 30d supply, fill #1

## 2024-02-22 MED ORDER — FUROSEMIDE 40 MG PO TABS
40.0000 mg | ORAL_TABLET | ORAL | 3 refills | Status: DC | PRN
  Filled 2024-02-22: qty 30, 30d supply, fill #0
  Filled 2024-04-01: qty 30, 30d supply, fill #1

## 2024-02-22 NOTE — Progress Notes (Signed)
     [address]       [name] [address]             [phone]       Patient: Home Provider: Office Consent for Telemedicine visit obtained.  Today's visit was completed via a real-time telehealth (see specific modality noted below). The patient/authorized person provided oral consent at the time of the visit to engage in a telemedicine encounter with the present provider at [facility]. The patient/authorized person was informed of the potential benefits, limitations, and risks of telemedicine. The patient/authorized person expressed understanding that the laws that protect confidentiality also apply to telemedicine. The patient/authorized person acknowledged understanding that telemedicine does not provide emergency services and that he or she would need to call 911 or proceed to the nearest hospital for help if such a need arose.   Total time spent in the clinical discussion 10 minutes.  Telehealth Modality: Phone visit (audio only)  I had a telephone visit with  Christine Hart who is s/p CABG.  Overall doing well.  Pain is minimal.  Ambulating well. Vitals have been stable.  Christine Hart will see us  back in 1 month with a chest x-ray for cardiac rehab clearance.  Khushbu Pippen MALVA Rayas

## 2024-02-22 NOTE — Patient Instructions (Signed)
 Medication Instructions:  Your physician has recommended you make the following change in your medication:   Lasix  and Potassium to be used as needed for weight gain of 3lbs in a day or 5lbs in a week or edema of lower extremities. Take lasix  and potassium together.  *If you need a refill on your cardiac medications before your next appointment, please call your pharmacy*  Lab Work: Your physician recommends that you have labs drawn today: LDL, CMET & CBC  If you have labs (blood work) drawn today and your tests are completely normal, you will receive your results only by: MyChart Message (if you have MyChart) OR A paper copy in the mail If you have any lab test that is abnormal or we need to change your treatment, we will call you to review the results.   Follow-Up: At [facility], you and your health needs are our priority.  As part of our continuing mission to provide you with exceptional heart care, our providers are all part of one team.  This team includes your primary Cardiologist (physician) and Advanced Practice Providers or APPs (Physician Assistants and Nurse Practitioners) who all work together to provide you with the care you need, when you need it.  Your next appointment:   3 month(s)  Provider:   Ozell Fell, MD    We recommend signing up for the patient portal called MyChart.  Sign up information is provided on this After Visit Summary.  MyChart is used to connect with patients for Virtual Visits (Telemedicine).  Patients are able to view lab/test results, encounter notes, upcoming appointments, etc.  Non-urgent messages can be sent to your provider as well.   To learn more about what you can do with MyChart, go to [email or web address].   Other Instructions Your proivder has requested that you regularly monitor and record your blood pressure readings at home. Please use the same machine at the same time of day to check your readings and record them to  bring to your follow-up visit. Please report consistent readings over 130/80.

## 2024-02-23 ENCOUNTER — Telehealth: Admitting: Thoracic Surgery (Cardiothoracic Vascular Surgery)

## 2024-02-23 LAB — COMPREHENSIVE METABOLIC PANEL WITH GFR
ALT: 17 IU/L (ref 0–32)
AST: 20 IU/L (ref 0–40)
Albumin: 4.3 g/dL (ref 3.8–4.8)
Alkaline Phosphatase: 114 IU/L (ref 44–121)
BUN/Creatinine Ratio: 16 (ref 12–28)
BUN: 14 mg/dL (ref 8–27)
Bilirubin Total: 0.5 mg/dL (ref 0.0–1.2)
CO2: 21 mmol/L (ref 20–29)
Calcium: 9.3 mg/dL (ref 8.7–10.3)
Chloride: 103 mmol/L (ref 96–106)
Creatinine, Ser: 0.85 mg/dL (ref 0.57–1.00)
Globulin, Total: 2.2 g/dL (ref 1.5–4.5)
Glucose: 89 mg/dL (ref 70–99)
Potassium: 4.1 mmol/L (ref 3.5–5.2)
Sodium: 140 mmol/L (ref 134–144)
Total Protein: 6.5 g/dL (ref 6.0–8.5)
eGFR: 71 mL/min/1.73 (ref >59)

## 2024-02-23 LAB — CBC
Hematocrit: 34.5 % (ref 34.0–46.6)
Hemoglobin: 10.6 g/dL — ABNORMAL LOW (ref 11.1–15.9)
MCH: 29.5 pg (ref 26.6–33.0)
MCHC: 30.7 g/dL — ABNORMAL LOW (ref 31.5–35.7)
MCV: 96 fL (ref 79–97)
Platelets: 353 x10E3/uL (ref 150–450)
RBC: 3.59 x10E6/uL — ABNORMAL LOW (ref 3.77–5.28)
RDW: 13.6 % (ref 11.7–15.4)
WBC: 8.1 x10E3/uL (ref 3.4–10.8)

## 2024-02-23 LAB — LDL CHOLESTEROL, DIRECT: LDL Direct: 47 mg/dL (ref 0–99)

## 2024-02-25 ENCOUNTER — Ambulatory Visit: Payer: Self-pay | Admitting: Student

## 2024-02-29 ENCOUNTER — Other Ambulatory Visit (HOSPITAL_COMMUNITY): Payer: Self-pay

## 2024-03-02 ENCOUNTER — Encounter: Payer: Self-pay | Admitting: Physician Assistant

## 2024-03-02 ENCOUNTER — Other Ambulatory Visit (HOSPITAL_COMMUNITY): Payer: Self-pay

## 2024-03-02 ENCOUNTER — Other Ambulatory Visit (HOSPITAL_BASED_OUTPATIENT_CLINIC_OR_DEPARTMENT_OTHER): Payer: Self-pay

## 2024-03-03 ENCOUNTER — Other Ambulatory Visit (HOSPITAL_BASED_OUTPATIENT_CLINIC_OR_DEPARTMENT_OTHER): Payer: Self-pay

## 2024-03-03 ENCOUNTER — Other Ambulatory Visit (HOSPITAL_COMMUNITY): Payer: Self-pay

## 2024-03-04 ENCOUNTER — Other Ambulatory Visit (HOSPITAL_BASED_OUTPATIENT_CLINIC_OR_DEPARTMENT_OTHER): Payer: Self-pay

## 2024-03-04 ENCOUNTER — Telehealth: Payer: Self-pay | Admitting: *Deleted

## 2024-03-04 ENCOUNTER — Other Ambulatory Visit (HOSPITAL_COMMUNITY): Payer: Self-pay

## 2024-03-04 NOTE — Telephone Encounter (Signed)
 Patient called the answering service over the weekend. Returned call. States provider called regarding itching of hands. States she was told to take Benadryl. Patient reports she has been taking. Nothing further at this time.

## 2024-03-13 ENCOUNTER — Ambulatory Visit: Admitting: Student

## 2024-03-14 ENCOUNTER — Ambulatory Visit (HOSPITAL_BASED_OUTPATIENT_CLINIC_OR_DEPARTMENT_OTHER)
Admission: EM | Admit: 2024-03-14 | Discharge: 2024-03-14 | Disposition: A | Discharge status: Home / Self Care | Attending: Family Medicine | Admitting: Family Medicine

## 2024-03-14 ENCOUNTER — Ambulatory Visit (INDEPENDENT_AMBULATORY_CARE_PROVIDER_SITE_OTHER): Admit: 2024-03-14 | Discharge: 2024-03-14 | Disposition: A | Discharge status: Home / Self Care | Admitting: Radiology

## 2024-03-14 ENCOUNTER — Other Ambulatory Visit: Payer: Self-pay

## 2024-03-14 ENCOUNTER — Telehealth: Payer: Self-pay | Admitting: Cardiovascular Disease

## 2024-03-14 ENCOUNTER — Encounter (HOSPITAL_BASED_OUTPATIENT_CLINIC_OR_DEPARTMENT_OTHER): Payer: Self-pay

## 2024-03-14 ENCOUNTER — Telehealth: Payer: Self-pay

## 2024-03-14 DIAGNOSIS — R0602 Shortness of breath: Secondary | ICD-10-CM | POA: Diagnosis not present

## 2024-03-14 DIAGNOSIS — J90 Pleural effusion, not elsewhere classified: Secondary | ICD-10-CM

## 2024-03-14 DIAGNOSIS — J9811 Atelectasis: Secondary | ICD-10-CM | POA: Diagnosis not present

## 2024-03-14 DIAGNOSIS — R0989 Other specified symptoms and signs involving the circulatory and respiratory systems: Secondary | ICD-10-CM | POA: Diagnosis not present

## 2024-03-14 DIAGNOSIS — Z951 Presence of aortocoronary bypass graft: Secondary | ICD-10-CM

## 2024-03-14 NOTE — Telephone Encounter (Signed)
 Called patient back about message. Patient has recent history of CABG with Dr. Shyrl. Patient stated that she had some congestion and coughing, and gets SOB with activity. Patient has seen providers from surgeon's office and cardiology office since surgery. Patient stated this has been going on since she left hospital. Encouraged patient to call her PCP. Patient stated he is hard to get a hold of. Patient stated she would rather go to urgent care down the street. Encouraged patient to get evaluated at urgent care and to let Dr. Shyrl know what is going on. Patient verbalized understanding.

## 2024-03-14 NOTE — Telephone Encounter (Signed)
 Patient's husband, Thomasina contacted the office concerned about patient's recent breathing. She is short of breath and has a dry cough. She was advised to go to an urgent care to get a chest xray. Chest xray looked over by PA, Manuelita whom states patient should have a thoracentesis to drain excess fluid.  Patient scheduled to have thoracentesis done tomorrow and she is to follow-up with Manuelita, [place] next week with chest xray. Patient's husband is aware and acknowledged receipt.  Patient was also advised to take lasix  and potassium as needed during this time to help with fluid and shortness of breath. He is aware that if her shortness of breath worsens she should go to the nearest emergency room.

## 2024-03-14 NOTE — Discharge Instructions (Addendum)
 You have a large amount of fluid in the left lung. I would recommend going to the ER at [facility] to be seen and further management for this. You can call your cardiologist also for further advice.

## 2024-03-14 NOTE — ED Triage Notes (Addendum)
 Pt states that she has had SOBr, chest congestion, and cough-productive. The SOBr is constant and gets worst when she walks around. pt denies nasal congestion, facial pain, ha, fever, body aches, chill, and chest pain. She had open heart surgery on 02/01/24 and states that is when the SOBr started. She has told her cardiologist about her symptoms and they suggested she come to UC. She has an apt with them next week.

## 2024-03-14 NOTE — Telephone Encounter (Signed)
 Pt c/o Shortness Of Breath: STAT if SOB developed within the last 24 hours or pt is noticeably SOB on the phone  1. Are you currently SOB (can you hear that pt is SOB on the phone)? no  2. How long have you been experiencing SOB? Since she left hospital 7/19  3. Are you SOB when sitting or when up moving around? Moving around   4. Are you currently experiencing any other symptoms? Congestion and coughing

## 2024-03-15 ENCOUNTER — Ambulatory Visit (HOSPITAL_COMMUNITY)
Admission: RE | Admit: 2024-03-15 | Discharge: 2024-03-15 | Disposition: A | Discharge status: Home / Self Care | Source: Ambulatory Visit

## 2024-03-15 ENCOUNTER — Ambulatory Visit (HOSPITAL_COMMUNITY)
Admission: RE | Admit: 2024-03-15 | Discharge: 2024-03-15 | Disposition: A | Discharge status: Home / Self Care | Source: Ambulatory Visit | Attending: Radiology | Admitting: Radiology

## 2024-03-15 DIAGNOSIS — J90 Pleural effusion, not elsewhere classified: Secondary | ICD-10-CM | POA: Insufficient documentation

## 2024-03-15 DIAGNOSIS — Z48813 Encounter for surgical aftercare following surgery on the respiratory system: Secondary | ICD-10-CM | POA: Diagnosis not present

## 2024-03-15 DIAGNOSIS — Z951 Presence of aortocoronary bypass graft: Secondary | ICD-10-CM | POA: Insufficient documentation

## 2024-03-15 MED ORDER — LIDOCAINE-EPINEPHRINE 1 %-1:100000 IJ SOLN
INTRAMUSCULAR | Status: AC
  Filled 2024-03-15: qty 1

## 2024-03-15 MED ORDER — LIDOCAINE-EPINEPHRINE 1 %-1:100000 IJ SOLN: 20.0000 mL | Freq: Once | INTRAMUSCULAR | Status: DC

## 2024-03-15 NOTE — Procedures (Signed)
 PROCEDURE SUMMARY:  Successful image-guided left thoracentesis. Yielded 650 mL of amber fluid. Patient tolerated procedure well. EBL: trace No immediate complications.  Specimen not sent for labs. Post procedure CXR shows no pneumothorax.  Please see imaging section of Epic for full dictation.  Kimble DEL Kevonte Vanecek PA-C 03/15/2024 12:20 PM

## 2024-03-18 ENCOUNTER — Telehealth: Payer: Self-pay | Admitting: Cardiovascular Disease

## 2024-03-18 ENCOUNTER — Other Ambulatory Visit (HOSPITAL_BASED_OUTPATIENT_CLINIC_OR_DEPARTMENT_OTHER): Payer: Self-pay

## 2024-03-18 MED ORDER — TELMISARTAN 80 MG PO TABS
80.0000 mg | ORAL_TABLET | Freq: Every day | ORAL | 3 refills | Status: AC
  Filled 2024-03-18: qty 90, 90d supply, fill #0
  Filled 2024-06-11: qty 90, 90d supply, fill #1

## 2024-03-18 NOTE — Telephone Encounter (Signed)
 RX sent in

## 2024-03-18 NOTE — Telephone Encounter (Signed)
*  STAT* If patient is at the pharmacy, call can be transferred to refill team.   1. Which medications need to be refilled? (please list name of each medication and dose if known)   telmisartan  (MICARDIS ) 80 MG tablet    2. Which pharmacy/location (including street and city if local pharmacy) is medication to be sent to?  [facility]      3. Do they need a 30 day or 90 day supply? 90 day

## 2024-03-18 NOTE — ED Provider Notes (Signed)
 [facility]    CSN: [id number] Arrival date & time: 03/14/24  1142      History   Chief Complaint Chief Complaint  Patient presents with   Shortness of Breath   Cough    HPI Christine Hart is a 78 y.o. female.   78 year old female presents with SOB, chest congestion, and cough-productive. The SOB is constant and gets worst when she walks around. pt denies nasal congestion, facial pain, ha, fever, body aches, chill, and chest pain. She had open heart surgery on 02/01/24 and states that is when the SOB started. She has told her cardiologist about her symptoms and they suggested she come to UC. She has an apt with them next week.   Shortness of Breath Associated symptoms: cough   Cough Associated symptoms: shortness of breath     Past Medical History:  Diagnosis Date   Anxiety    Arthritis    Cancer (HCC)    basal and squamous cell carcinoma. Lesion excisions in 2023.   Complication of anesthesia    Limited mouth opening and neck mobility, s/p TMJ surgeries    Difficult intubation    Fibromyalgia    Hypertension    MVP (mitral valve prolapse)    Pneumonia 2023   Hospitalized for 2 nights, [place].   PONV (postoperative nausea and vomiting)     Patient Active Problem List   Diagnosis Date Noted   S/P CABG x 3 02/01/2024   CAD (coronary artery disease) 01/19/2024   Spondylolisthesis of lumbar region 12/07/2022   Sepsis (HCC) 10/29/2021   Anxiety 10/29/2021   Hyponatremia 10/29/2021   Influenza A 05/26/2021   Hypertension    Acute respiratory failure with hypoxia (HCC)    Arthropathy of cervical facet joint 02/16/2017    Past Surgical History:  Procedure Laterality Date   ABDOMINAL HYSTERECTOMY     APPENDECTOMY     CATARACT EXTRACTION Bilateral    CERVICAL SPINE SURGERY     CORONARY ARTERY BYPASS GRAFT N/A 02/01/2024   Procedure: CORONARY ARTERY BYPASS GRAFTING X 3, USING LEFT INTERNAL MAMMARY ARTERY AND ENDOSCOPIC HARVESTED RIGHT SAPHENOUS  VEIN, ENDARTERECTOMY LEFT ANTERIOR DESCENDING CORONARY ARTERY;  Surgeon: Shyrl Linnie KIDD, MD;  Location: [facility] OR;  Service: Open Heart Surgery;  Laterality: N/A;   DILATION AND CURETTAGE OF UTERUS     x 2   FIRST RIB REMOVAL Right    HAND SURGERY Bilateral    IR THORACENTESIS ASP PLEURAL SPACE W/IMG GUIDE  03/15/2024   KNEE ARTHROSCOPY W/ MENISCAL REPAIR Right    KNEE ARTHROSCOPY W/ MENISCAL REPAIR Left    LEFT HEART CATH AND CORONARY ANGIOGRAPHY N/A 01/15/2024   Procedure: LEFT HEART CATH AND CORONARY ANGIOGRAPHY;  Surgeon: Wonda Sharper, MD;  Location: [facility] INVASIVE CV LAB;  Service: Cardiovascular;  Laterality: N/A;   NEUROMA SURGERY Left    POSTERIOR CERVICAL FUSION/FORAMINOTOMY N/A 02/16/2017   Procedure: CERVICAL 1- CERVICAL 2 POSTERIOR INSTRUMENTATION AND FUSION;  Surgeon: Mavis Purchase, MD;  Location: [facility] OR;  Service: Neurosurgery;  Laterality: N/A;  CERVICAL 1- CERVICAL 2 POSTERIOR INSTRUMENTATION AND FUSION   SQUAMOUS CELL CARCINOMA EXCISION  2023   2 on right leg and 1 on nose.   TEE WITHOUT CARDIOVERSION N/A 02/01/2024   Procedure: ECHOCARDIOGRAM, TRANSESOPHAGEAL;  Surgeon: Shyrl Linnie KIDD, MD;  Location: [facility] OR;  Service: Open Heart Surgery;  Laterality: N/A;   TMJ ARTHROPLASTY      OB History   No obstetric history on file.  Home Medications    Prior to Admission medications   Medication Sig Start Date End Date Taking? Authorizing Provider  ALPRAZolam  (XANAX ) 0.5 MG tablet Take 0.5 mg by mouth 3 (three) times daily as needed for anxiety. 05/14/21   [provider]  amiodarone  (PACERONE ) 200 MG tablet Take 2 tablets (400 mg total) by mouth 2 (two) times daily for 1 week then reduce the dose to 1 tablet twice daily for 2 weeks then reduce the dose to 1 tablet daily. 02/09/24   Roddenberry, Myron G, PA-C  aspirin  EC 81 MG tablet Take 1 tablet (81 mg total) by mouth daily. Swallow whole. 12/26/23   [place], Callie E, PA-C  clopidogrel  (PLAVIX ) 75 MG  tablet Take 1 tablet (75 mg total) by mouth daily. 02/09/24   Roddenberry, Myron G, PA-C  escitalopram  (LEXAPRO ) 10 MG tablet Take 10 mg by mouth daily. 11/23/23   [provider]  furosemide  (LASIX ) 40 MG tablet Take 1 tablet (40 mg total) by mouth as needed for fluid or edema. For weight gain of 3lbs in a day or 5lbs in a week or leg edema. 02/22/24   [place], Callie E, PA-C  metoprolol  succinate (TOPROL -XL) 50 MG 24 hr tablet Take 1 tablet (50 mg total) by mouth daily. Take with or immediately following a meal. 02/09/24   Roddenberry, Myron G, PA-C  potassium chloride  SA (KLOR-CON  M) 20 MEQ tablet Take 1 tablet (20 mEq total) by mouth as needed (when taking lasix .). 02/22/24   [place], Callie E, PA-C  rosuvastatin  (CRESTOR ) 40 MG tablet Take 1 tablet (40 mg total) by mouth daily. 12/26/23   [place], Callie E, PA-C  telmisartan  (MICARDIS ) 80 MG tablet Take 1 tablet (80 mg total) by mouth daily. 03/18/24   Wonda Sharper, MD  traMADol  (ULTRAM ) 50 MG tablet Take 50 mg by mouth as needed for moderate pain (pain score 4-6).    [provider]    Family History History reviewed. No pertinent family history.  Social History Social History   Tobacco Use   Smoking status: Never   Smokeless tobacco: Never  Vaping Use   Vaping status: Never Used  Substance Use Topics   Alcohol use: Not Currently    Alcohol/week: 3.0 standard drinks of alcohol    Types: 3 Shots of liquor per week    Comment: 3 drinks a week.   Drug use: No     Allergies   Amlodipine    Review of Systems Review of Systems  Respiratory:  Positive for cough and shortness of breath.      Physical Exam Triage Vital Signs ED Triage Vitals  Encounter Vitals Group     BP 03/14/24 1155 (!) 177/106     Girls Systolic BP Percentile --      Girls Diastolic BP Percentile --      Boys Systolic BP Percentile --      Boys Diastolic BP Percentile --      Pulse Rate 03/14/24 1155 (!) 59     Resp 03/14/24 1155 20      Temp 03/14/24 1155 98.1 F (36.7 C)     Temp Source 03/14/24 1155 Oral     SpO2 03/14/24 1155 96 %     Weight --      Height --      Head Circumference --      Peak Flow --      Pain Score 03/14/24 1153 0     Pain Loc --  Pain Education --      Exclude from Growth Chart --    No data found.  Updated Vital Signs BP (!) 177/106 (BP Location: Right Arm)   Pulse (!) 59   Temp 98.1 F (36.7 C) (Oral)   Resp 20   SpO2 96%   Visual Acuity Right Eye Distance:   Left Eye Distance:   Bilateral Distance:    Right Eye Near:   Left Eye Near:    Bilateral Near:     Physical Exam Vitals and nursing note reviewed.  Constitutional:      General: She is not in acute distress.    Appearance: She is ill-appearing. She is not toxic-appearing or diaphoretic.  Cardiovascular:     Rate and Rhythm: Normal rate and regular rhythm.  Pulmonary:     Effort: Pulmonary effort is normal.     Breath sounds: Examination of the left-upper field reveals decreased breath sounds. Examination of the left-middle field reveals decreased breath sounds. Examination of the left-lower field reveals decreased breath sounds. Decreased breath sounds present.  Musculoskeletal:        General: Normal range of motion.  Skin:    General: Skin is warm and dry.  Neurological:     Mental Status: She is alert.  Psychiatric:        Mood and Affect: Mood normal.      UC Treatments / Results  Labs (all labs ordered are listed, but only abnormal results are displayed) Labs Reviewed - No data to display  EKG   Radiology No results found.  Procedures Procedures (including critical care time)  Medications Ordered in UC Medications - No data to display  Initial Impression / Assessment and Plan / UC Course  I have reviewed the triage vital signs and the nursing notes.  Pertinent labs & imaging results that were available during my care of the patient were reviewed by me and considered in my medical  decision making (see chart for details).     SOB-patient presents today with worsening shortness of breath over the past month.  History of bypass surgery back in July .  Patient sent here for chest x-ray.  Patient x-ray positive for pleural effusion that extends most of the left lung.   Recommendations were to call her cardiologist for further advice otherwise go to the ER for treatment of this. Patient understanding and agreed. Patient's vital signs stable upon discharge and no acute distress. Final Clinical Impressions(s) / UC Diagnoses   Final diagnoses:  SOB (shortness of breath)     Discharge Instructions      You have a large amount of fluid in the left lung. I would recommend going to the ER at [facility] to be seen and further management for this. You can call your cardiologist also for further advice.     ED Prescriptions   None    PDMP not reviewed this encounter.   Adah Wilbert LABOR, FNP 03/18/24 1219

## 2024-03-20 ENCOUNTER — Other Ambulatory Visit: Payer: Self-pay

## 2024-03-20 ENCOUNTER — Other Ambulatory Visit: Payer: Self-pay | Admitting: Thoracic Surgery (Cardiothoracic Vascular Surgery)

## 2024-03-20 ENCOUNTER — Ambulatory Visit: Payer: Self-pay

## 2024-03-20 ENCOUNTER — Ambulatory Visit (HOSPITAL_COMMUNITY)
Admission: RE | Admit: 2024-03-20 | Discharge: 2024-03-20 | Disposition: A | Discharge status: Home / Self Care | Source: Ambulatory Visit | Attending: Cardiology | Admitting: Cardiology

## 2024-03-20 VITALS — BP 148/80 | HR 56 | Resp 20 | Ht 66.0 in | Wt 170.0 lb

## 2024-03-20 DIAGNOSIS — J90 Pleural effusion, not elsewhere classified: Secondary | ICD-10-CM

## 2024-03-20 DIAGNOSIS — Z951 Presence of aortocoronary bypass graft: Secondary | ICD-10-CM

## 2024-03-20 DIAGNOSIS — Z48812 Encounter for surgical aftercare following surgery on the circulatory system: Secondary | ICD-10-CM | POA: Diagnosis not present

## 2024-03-20 DIAGNOSIS — Z981 Arthrodesis status: Secondary | ICD-10-CM | POA: Diagnosis not present

## 2024-03-20 DIAGNOSIS — I25119 Atherosclerotic heart disease of native coronary artery with unspecified angina pectoris: Secondary | ICD-10-CM

## 2024-03-20 NOTE — Patient Instructions (Signed)
-   Thoracentesis with interventional radiology for left-sided pleural effusion -Follow-up in 3 weeks with cardiac and thoracic surgery with chest x-ray for pleural effusion

## 2024-03-20 NOTE — Progress Notes (Signed)
 [address] [place] [address]             [phone]       HPI: Patient returns for routine postoperative follow-up having undergone CABG X 3.  LIMA LAD, RSVG OM, diagonal LAD endarterectomy and endoscopic greater saphenous vein harvest on the right on 02/01/2024 with Dr. Shyrl.  The patient's early postoperative recovery while in the hospital was notable for starting Plavix  for endarterectomy.  She did have atrial fibrillation and was started on IV amiodarone  bolus and oral diltiazem  30 mg.  On 07/17 she was noted to have a small to moderate left pleural effusion and since she was not symptomatic was managed with diuretics.  She was discharged home in stable condition on 02/09/2024.SABRA  Since hospital discharge the patient reports that she has been doing well.  She was seen at urgent care for shortness of breath on 03/14/2024 where is found that she had a large left sided pleural effusion.  She was sent to interventional radiology and had this drained.  She did note improvement of her shortness of breath after this.  She reports that she is not using any medications for pain.  Her incisions have been healing well.  She denies chest pain, shortness of breath and lower leg swelling   Current Outpatient Medications  Medication Sig Dispense Refill   ALPRAZolam  (XANAX ) 0.5 MG tablet Take 0.5 mg by mouth 3 (three) times daily as needed for anxiety.     amiodarone  (PACERONE ) 200 MG tablet Take 2 tablets (400 mg total) by mouth 2 (two) times daily for 1 week then reduce the dose to 1 tablet twice daily for 2 weeks then reduce the dose to 1 tablet daily. 60 tablet 1   aspirin  EC 81 MG tablet Take 1 tablet (81 mg total) by mouth daily. Swallow whole.     clopidogrel  (PLAVIX ) 75 MG tablet Take 1 tablet (75 mg total) by mouth daily. 90 tablet 3   escitalopram  (LEXAPRO ) 10 MG tablet Take 10 mg by mouth daily.     metoprolol  succinate (TOPROL -XL) 50 MG 24 hr tablet Take 1  tablet (50 mg total) by mouth daily. Take with or immediately following a meal. 30 tablet 2   rosuvastatin  (CRESTOR ) 40 MG tablet Take 1 tablet (40 mg total) by mouth daily. 90 tablet 3   telmisartan  (MICARDIS ) 80 MG tablet Take 1 tablet (80 mg total) by mouth daily. 90 tablet 3   traMADol  (ULTRAM ) 50 MG tablet Take 50 mg by mouth as needed for moderate pain (pain score 4-6).     furosemide  (LASIX ) 40 MG tablet Take 1 tablet (40 mg total) by mouth as needed for fluid or edema. For weight gain of 3lbs in a day or 5lbs in a week or leg edema. (Patient not taking: Reported on 03/20/2024) 30 tablet 3   potassium chloride  SA (KLOR-CON  M) 20 MEQ tablet Take 1 tablet (20 mEq total) by mouth as needed (when taking lasix .). (Patient not taking: Reported on 03/20/2024) 30 tablet 3   No current facility-administered medications for this visit.   Vitals:   03/20/24 1314  BP: (!) 148/80  Pulse: (!) 56  Resp: 20  SpO2: 95%   Review of Systems  Constitutional:  Positive for malaise/fatigue.  Respiratory:  Positive for cough. Negative for shortness of breath.   Cardiovascular: Negative.  Negative for chest pain and leg swelling.    Physical Exam  Constitutional:      Appearance: Normal appearance.  HENT:     Head: Normocephalic and atraumatic.  Cardiovascular:     Rate and Rhythm: Normal rate and regular rhythm.     Heart sounds: Normal heart sounds, S1 normal and S2 normal.  Pulmonary:     Effort: Pulmonary effort is normal.     Breath sounds: Examination of the left-lower field reveals decreased breath sounds. Decreased breath sounds present.     Comments: Slightly diminished Musculoskeletal:     Cervical back: Normal range of motion.  Skin:    General: Skin is warm and dry.  Neurological:     General: No focal deficit present.     Mental Status: She is alert.      Diagnostic Tests: Narrative & Impression  EXAM: 2 VIEW(S) XRAY OF THE CHEST 03/20/2024 01:06:00 PM    COMPARISON: 03/15/2024   CLINICAL HISTORY: S/P CABG. S/P CABG   FINDINGS:   LUNGS AND PLEURA: Interval increase in size of now moderate-to-large left pleural effusion. Left basilar opacities. Stable small right pleural effusion.   HEART AND MEDIASTINUM: Median sternotomy noted.   BONES AND SOFT TISSUES: Surgical clips over right chest wall. ACDF hardware within the lower cervical spine.   IMPRESSION: 1. Interval increase in size of now moderate-to-large left pleural effusion. 2. Stable small right pleural effusion.   Electronically signed by: Waddell Calk MD 03/20/2024 01:48 PM EDT RP Workstation: [id number]    Plan: S/P CABG (coronary artery bypass graft) We reviewed today's chest x ray.  Pleural effusion still present on chest x-ray.  We discussed driving and she is able to do so since she is no longer taking narcotics for pain.  We discussed participation in cardiac rehab and she would like to participate at this time.  Discussed that she now should continue sternal precautions until a's full 6 weeks from surgery.  Has been seen by cardiology and she is to continue metoprolol  50 mg daily.  She is to continue with aspirin  and Plavix .  She is to continue Crestor  for hyperlipidemia.  Pleural effusion -Left-sided moderate to large pleural effusion on chest x-ray today.  Scheduled patient with interventional radiology for thoracentesis.  She is to start taking furosemide  40 mg and potassium 20 mEq daily until her follow-up appointment in 3 weeks with [facility].      Manuelita CHRISTELLA Rough, PA-C [facility] [phone]

## 2024-03-21 ENCOUNTER — Ambulatory Visit (HOSPITAL_COMMUNITY)
Admission: RE | Admit: 2024-03-21 | Discharge: 2024-03-21 | Disposition: A | Discharge status: Home / Self Care | Source: Ambulatory Visit

## 2024-03-21 DIAGNOSIS — Z951 Presence of aortocoronary bypass graft: Secondary | ICD-10-CM | POA: Diagnosis not present

## 2024-03-21 DIAGNOSIS — R918 Other nonspecific abnormal finding of lung field: Secondary | ICD-10-CM | POA: Diagnosis not present

## 2024-03-21 DIAGNOSIS — J90 Pleural effusion, not elsewhere classified: Secondary | ICD-10-CM | POA: Diagnosis not present

## 2024-03-21 DIAGNOSIS — I700 Atherosclerosis of aorta: Secondary | ICD-10-CM | POA: Diagnosis not present

## 2024-03-21 MED ORDER — LIDOCAINE HCL 1 % IJ SOLN
20.0000 mL | Freq: Once | INTRAMUSCULAR | Status: AC
  Administered 2024-03-21: 10 mL via INTRADERMAL

## 2024-03-21 MED ORDER — LIDOCAINE HCL 1 % IJ SOLN
INTRAMUSCULAR | Status: AC
  Filled 2024-03-21: qty 20

## 2024-03-21 NOTE — Procedures (Signed)
 PROCEDURE SUMMARY:  Successful image-guided left thoracentesis. Yielded 850 mL of amber fluid. Patient tolerated procedure well. EBL: trace No immediate complications.  Specimen not sent for labs. Post procedure CXR ordered  Please see imaging section of Epic for full dictation.  Kimble DEL Nyeema Want PA-C 03/21/2024 2:00 PM

## 2024-03-26 ENCOUNTER — Other Ambulatory Visit (HOSPITAL_BASED_OUTPATIENT_CLINIC_OR_DEPARTMENT_OTHER): Payer: Self-pay

## 2024-04-01 ENCOUNTER — Other Ambulatory Visit (HOSPITAL_BASED_OUTPATIENT_CLINIC_OR_DEPARTMENT_OTHER): Payer: Self-pay

## 2024-04-03 ENCOUNTER — Other Ambulatory Visit (HOSPITAL_BASED_OUTPATIENT_CLINIC_OR_DEPARTMENT_OTHER): Payer: Self-pay

## 2024-04-08 ENCOUNTER — Other Ambulatory Visit (HOSPITAL_BASED_OUTPATIENT_CLINIC_OR_DEPARTMENT_OTHER): Payer: Self-pay

## 2024-04-09 ENCOUNTER — Other Ambulatory Visit: Payer: Self-pay | Admitting: Thoracic Surgery (Cardiothoracic Vascular Surgery)

## 2024-04-09 DIAGNOSIS — Z951 Presence of aortocoronary bypass graft: Secondary | ICD-10-CM

## 2024-04-10 ENCOUNTER — Ambulatory Visit: Payer: Self-pay

## 2024-04-10 ENCOUNTER — Ambulatory Visit (HOSPITAL_COMMUNITY)
Admission: RE | Admit: 2024-04-10 | Discharge: 2024-04-10 | Disposition: A | Discharge status: Home / Self Care | Source: Ambulatory Visit | Attending: Cardiovascular Disease | Admitting: Cardiovascular Disease

## 2024-04-10 ENCOUNTER — Other Ambulatory Visit (HOSPITAL_BASED_OUTPATIENT_CLINIC_OR_DEPARTMENT_OTHER): Payer: Self-pay

## 2024-04-10 VITALS — BP 147/86 | HR 51 | Resp 18 | Ht 66.0 in | Wt 157.0 lb

## 2024-04-10 DIAGNOSIS — Z951 Presence of aortocoronary bypass graft: Secondary | ICD-10-CM | POA: Insufficient documentation

## 2024-04-10 DIAGNOSIS — J90 Pleural effusion, not elsewhere classified: Secondary | ICD-10-CM

## 2024-04-10 DIAGNOSIS — J9811 Atelectasis: Secondary | ICD-10-CM | POA: Diagnosis not present

## 2024-04-10 DIAGNOSIS — I517 Cardiomegaly: Secondary | ICD-10-CM | POA: Diagnosis not present

## 2024-04-10 MED ORDER — METHYLPREDNISOLONE 4 MG PO TBPK
ORAL_TABLET | ORAL | 0 refills | Status: DC
  Filled 2024-04-10: qty 21, 6d supply, fill #0

## 2024-04-10 NOTE — Patient Instructions (Signed)
-  Follow up in 2 weeks with chest x-ray -Continue with lasix  and potassium daily -Start steroid taper

## 2024-04-10 NOTE — Progress Notes (Signed)
 [address] [place] [address]             [phone]       HPI: Patient returns for routine postoperative follow-up having undergone CABG X 3.  LIMA LAD, RSVG OM, diagonal LAD endarterectomy and endoscopic greater saphenous vein harvest on the right on 02/01/2024 with Dr. Shyrl.   She presents for follow up after having thoracentesis for left pleural effusion on 03/21/2024.  The procedure yielded 850 mL of amber fluid.  She was also prescribed lasix  40 mg to take daily.  Since then she reports that she has been feeling better.  She has been walking around her house for exercise.  She only experiences some shortness of breath when bending down.  Denies dyspnea at rest, when lying down and on exertion.  Denies coughing, chest pain and lower leg swelling.    Current Outpatient Medications  Medication Sig Dispense Refill   ALPRAZolam  (XANAX ) 0.5 MG tablet Take 0.5 mg by mouth 3 (three) times daily as needed for anxiety.     amiodarone  (PACERONE ) 200 MG tablet Take 2 tablets (400 mg total) by mouth 2 (two) times daily for 1 week then reduce the dose to 1 tablet twice daily for 2 weeks then reduce the dose to 1 tablet daily. 60 tablet 1   aspirin  EC 81 MG tablet Take 1 tablet (81 mg total) by mouth daily. Swallow whole.     clopidogrel  (PLAVIX ) 75 MG tablet Take 1 tablet (75 mg total) by mouth daily. 90 tablet 3   escitalopram  (LEXAPRO ) 10 MG tablet Take 10 mg by mouth daily.     furosemide  (LASIX ) 40 MG tablet Take 1 tablet (40 mg total) by mouth as needed for fluid or edema. For weight gain of 3lbs in a day or 5lbs in a week or leg edema. 30 tablet 3   metoprolol  succinate (TOPROL -XL) 50 MG 24 hr tablet Take 1 tablet (50 mg total) by mouth daily. Take with or immediately following a meal. 30 tablet 2   potassium chloride  SA (KLOR-CON  M) 20 MEQ tablet Take 1 tablet (20 mEq total) by mouth as needed (when taking lasix .). 30 tablet 3   rosuvastatin  (CRESTOR ) 40  MG tablet Take 1 tablet (40 mg total) by mouth daily. 90 tablet 3   telmisartan  (MICARDIS ) 80 MG tablet Take 1 tablet (80 mg total) by mouth daily. 90 tablet 3   traMADol  (ULTRAM ) 50 MG tablet Take 50 mg by mouth as needed for moderate pain (pain score 4-6).     No current facility-administered medications for this visit.   Vitals:   04/10/24 1254  BP: (!) 147/86  Pulse: (!) 51  Resp: 18  SpO2: 96%    Review of Systems  Constitutional:  Negative for fever and malaise/fatigue.  Respiratory:  Negative for cough, shortness of breath and wheezing.   Cardiovascular:  Negative for chest pain, palpitations and leg swelling.    Physical Exam Constitutional:      Appearance: Normal appearance.  HENT:     Head: Normocephalic and atraumatic.  Cardiovascular:     Rate and Rhythm: Normal rate and regular rhythm.     Heart sounds: Normal heart sounds, S1 normal and S2 normal.  Pulmonary:     Effort: Pulmonary effort is normal.     Breath sounds: Examination of the left-lower field reveals decreased breath sounds. Decreased breath sounds present.  Musculoskeletal:  Cervical back: Normal range of motion.  Skin:    General: Skin is warm and dry.  Neurological:     General: No focal deficit present.     Mental Status: She is alert.      Diagnostic Tests: CLINICAL DATA:  CABG   EXAM: CHEST - 2 VIEW   COMPARISON:  03/21/2024   FINDINGS: Prior CABG. Cardiomegaly. Large left pleural effusion with left base atelectasis. No effusion or confluent opacity on the right. No acute bony abnormality. No pneumothorax.   IMPRESSION: Large left pleural effusion with left base atelectasis. No pneumothorax.     Electronically Signed   By: Franky Crease M.D.   On: 04/10/2024 13:22  Plan:  S/P CABG (coronary artery bypass graft) -Continue to increase activity/exercise as tolerated  Pleural effusion -Continue with lasix  40 mg and potassium 20 mg daily -Prescribed steroid taper to  help with effusion -Continue to exercise and move around   -She is to call the office if she starts to have shortness of breath with exertion or at rest or dry cough. May need to be scheduled for thoracentesis -Follow up in 2 weeks with chest xray    Manuelita CHRISTELLA Rough, PA-C [facility] [phone]

## 2024-04-23 ENCOUNTER — Other Ambulatory Visit: Payer: Self-pay | Admitting: Thoracic Surgery (Cardiothoracic Vascular Surgery)

## 2024-04-23 DIAGNOSIS — Z951 Presence of aortocoronary bypass graft: Secondary | ICD-10-CM

## 2024-04-24 ENCOUNTER — Ambulatory Visit: Payer: Self-pay

## 2024-04-24 ENCOUNTER — Ambulatory Visit (HOSPITAL_COMMUNITY)
Admission: RE | Admit: 2024-04-24 | Discharge: 2024-04-24 | Disposition: A | Discharge status: Home / Self Care | Source: Ambulatory Visit | Attending: Cardiovascular Disease | Admitting: Cardiovascular Disease

## 2024-04-24 VITALS — BP 150/67 | HR 55 | Resp 18 | Ht 66.0 in

## 2024-04-24 DIAGNOSIS — Z951 Presence of aortocoronary bypass graft: Secondary | ICD-10-CM

## 2024-04-24 DIAGNOSIS — J9811 Atelectasis: Secondary | ICD-10-CM | POA: Diagnosis not present

## 2024-04-24 DIAGNOSIS — J90 Pleural effusion, not elsewhere classified: Secondary | ICD-10-CM

## 2024-04-24 DIAGNOSIS — I517 Cardiomegaly: Secondary | ICD-10-CM | POA: Diagnosis not present

## 2024-04-24 NOTE — Patient Instructions (Signed)
Follow up in one month with chest xray

## 2024-04-24 NOTE — Progress Notes (Addendum)
 [address] [place] [address]             [phone]       HPI: Patient returns for routine postoperative follow-up having undergone CABG X 3.  LIMA LAD, RSVG OM, diagonal LAD endarterectomy and endoscopic greater saphenous vein harvest on the right on 02/01/2024 with Dr. Shyrl.   She presents for follow up of left sided pleural effusion.  At last visit she was given steroid taper.  She was able to take all of the steroid as prescribed.  She has been able to increase her activity and exercise.  She has been walking more.  She denies chest pain, shortness of breath and lower leg swelling.    Current Outpatient Medications  Medication Sig Dispense Refill   ALPRAZolam  (XANAX ) 0.5 MG tablet Take 0.5 mg by mouth 3 (three) times daily as needed for anxiety.     amiodarone  (PACERONE ) 200 MG tablet Take 2 tablets (400 mg total) by mouth 2 (two) times daily for 1 week then reduce the dose to 1 tablet twice daily for 2 weeks then reduce the dose to 1 tablet daily. 60 tablet 1   aspirin  EC 81 MG tablet Take 1 tablet (81 mg total) by mouth daily. Swallow whole.     clopidogrel  (PLAVIX ) 75 MG tablet Take 1 tablet (75 mg total) by mouth daily. 90 tablet 3   escitalopram  (LEXAPRO ) 10 MG tablet Take 10 mg by mouth daily.     furosemide  (LASIX ) 40 MG tablet Take 1 tablet (40 mg total) by mouth as needed for fluid or edema. For weight gain of 3lbs in a day or 5lbs in a week or leg edema. 30 tablet 3   methylPREDNISolone  (MEDROL  DOSEPAK) 4 MG TBPK tablet Take 6 tablets on day 1, take 5 tablets on day 2, take 4 tablets on day 3, take 3 tablets on day 4, take 2 tablets on day 5, take 1 tablet on day 6 21 each 0   metoprolol  succinate (TOPROL -XL) 50 MG 24 hr tablet Take 1 tablet (50 mg total) by mouth daily. Take with or immediately following a meal. 30 tablet 2   potassium chloride  SA (KLOR-CON  M) 20 MEQ tablet Take 1 tablet (20 mEq total) by mouth as needed (when taking  lasix .). 30 tablet 3   rosuvastatin  (CRESTOR ) 40 MG tablet Take 1 tablet (40 mg total) by mouth daily. 90 tablet 3   telmisartan  (MICARDIS ) 80 MG tablet Take 1 tablet (80 mg total) by mouth daily. 90 tablet 3   traMADol  (ULTRAM ) 50 MG tablet Take 50 mg by mouth as needed for moderate pain (pain score 4-6).     No current facility-administered medications for this visit.   Vitals:   04/24/24 0914  BP: (!) 150/67  Pulse: (!) 55  Resp: 18  SpO2: 94%    Review of Systems  Constitutional:  Negative for fever and malaise/fatigue.  Respiratory:  Negative for cough, shortness of breath and wheezing.   Cardiovascular:  Negative for chest pain, palpitations and leg swelling.    Physical Exam Constitutional:      Appearance: Normal appearance.  HENT:     Head: Normocephalic and atraumatic.  Cardiovascular:     Rate and Rhythm: Normal rate and regular rhythm.     Heart sounds: Normal heart sounds, S1 normal and S2 normal.  Pulmonary:     Effort: Pulmonary effort is normal.  Breath sounds: Examination of the left-lower field reveals decreased breath sounds. Decreased breath sounds present.  Musculoskeletal:     Cervical back: Normal range of motion.  Skin:    General: Skin is warm and dry.  Neurological:     General: No focal deficit present.     Mental Status: She is alert.      Diagnostic Tests: CLINICAL DATA:  CABG.   EXAM: CHEST - 2 VIEW   COMPARISON:  04/10/2024   FINDINGS: Sternotomy wires unchanged. Lungs are adequately inflated demonstrate no change in a moderate size left pleural effusion occupying approximately half the left hemithorax likely with associated basilar atelectasis. Right lung is essentially clear. Mild stable cardiomegaly. Remainder of the exam is unchanged.   IMPRESSION: 1. Stable moderate size left pleural effusion with associated basilar atelectasis. 2. Stable cardiomegaly.     Electronically Signed   By: Toribio Agreste M.D.   On:  04/24/2024 09:13   Plan:  S/P CABG (coronary artery bypass graft) and Pleural effusion -Moderate sized left pleural effusion that is stable in size. She continues to be asymptomatic at this time.  -She is cleared to participate in cardiac rehab at this time.  -Discontinue taking lasix  daily, follow directions as prescribed.  Take if weight gain 3 lbs in one day or 5 lbs in one week.  -Continue to exercise and move around   -She is to call the office if she starts to have shortness of breath with exertion or at rest or dry cough.  -Follow up in one month     Manuelita CHRISTELLA Rough, PA-C [facility] [phone]

## 2024-04-29 ENCOUNTER — Ambulatory Visit (HOSPITAL_BASED_OUTPATIENT_CLINIC_OR_DEPARTMENT_OTHER): Admit: 2024-04-29 | Discharge: 2024-04-29 | Disposition: A | Admitting: Radiology

## 2024-04-29 ENCOUNTER — Other Ambulatory Visit: Payer: Self-pay | Admitting: Cardiovascular Disease

## 2024-04-29 ENCOUNTER — Encounter (HOSPITAL_BASED_OUTPATIENT_CLINIC_OR_DEPARTMENT_OTHER): Payer: Self-pay

## 2024-04-29 ENCOUNTER — Other Ambulatory Visit (HOSPITAL_BASED_OUTPATIENT_CLINIC_OR_DEPARTMENT_OTHER): Payer: Self-pay

## 2024-04-29 ENCOUNTER — Telehealth (HOSPITAL_COMMUNITY): Payer: Self-pay

## 2024-04-29 ENCOUNTER — Other Ambulatory Visit: Payer: Self-pay | Admitting: Physician Assistant

## 2024-04-29 ENCOUNTER — Ambulatory Visit (HOSPITAL_BASED_OUTPATIENT_CLINIC_OR_DEPARTMENT_OTHER)
Admission: EM | Admit: 2024-04-29 | Discharge: 2024-04-29 | Disposition: A | Discharge status: Home / Self Care | Attending: Family Medicine | Admitting: Family Medicine

## 2024-04-29 ENCOUNTER — Ambulatory Visit (HOSPITAL_BASED_OUTPATIENT_CLINIC_OR_DEPARTMENT_OTHER): Payer: Self-pay | Admitting: Family Medicine

## 2024-04-29 DIAGNOSIS — R051 Acute cough: Secondary | ICD-10-CM | POA: Diagnosis not present

## 2024-04-29 DIAGNOSIS — R058 Other specified cough: Secondary | ICD-10-CM | POA: Diagnosis not present

## 2024-04-29 DIAGNOSIS — R918 Other nonspecific abnormal finding of lung field: Secondary | ICD-10-CM | POA: Diagnosis not present

## 2024-04-29 DIAGNOSIS — I517 Cardiomegaly: Secondary | ICD-10-CM | POA: Diagnosis not present

## 2024-04-29 DIAGNOSIS — R0989 Other specified symptoms and signs involving the circulatory and respiratory systems: Secondary | ICD-10-CM | POA: Diagnosis not present

## 2024-04-29 MED ORDER — AMOXICILLIN 500 MG PO CAPS
1000.0000 mg | ORAL_CAPSULE | Freq: Three times a day (TID) | ORAL | 0 refills | Status: AC
Start: 2024-04-29 — End: 2024-05-06
  Filled 2024-04-29: qty 42, 7d supply, fill #0

## 2024-04-29 NOTE — Discharge Instructions (Signed)
 Take the amoxicillin as prescribed for sinus/respiratory infection.  Recommend Mucinex  for congestion and thin mucus.  Follow-up with your doctor for any continued issues

## 2024-04-29 NOTE — ED Provider Notes (Signed)
 [facility]    CSN: [id number] Arrival date & time: 04/29/24  1003      History   Chief Complaint Chief Complaint  Patient presents with   Cough   sinus congestion   sneezing   chest congestion    HPI Christine Hart is a 78 y.o. female.   Pt is a 79 year old female that presents with sneezing, runny nose,chest congestion, productive cough x 1 week. Taking mucinex  with little relief. Open heart surgery on 7/10. Seen 8/21 for same. Has had fluid removed 2 x from the lungs.  States has some CHF since heart surgery. States pain across sinus area of face, burning. Recent xray on the 1st with improvement in lungs.     Cough   Past Medical History:  Diagnosis Date   Anxiety    Arthritis    Cancer (HCC)    basal and squamous cell carcinoma. Lesion excisions in 2023.   Complication of anesthesia    Limited mouth opening and neck mobility, s/p TMJ surgeries    Difficult intubation    Fibromyalgia    Hypertension    MVP (mitral valve prolapse)    Pneumonia 2023   Hospitalized for 2 nights, [facility].   PONV (postoperative nausea and vomiting)     Patient Active Problem List   Diagnosis Date Noted   CAD (coronary artery disease) 01/19/2024   Spondylolisthesis of lumbar region 12/07/2022   Sepsis (HCC) 10/29/2021   Anxiety 10/29/2021   Hyponatremia 10/29/2021   Influenza A 05/26/2021   Hypertension    Acute respiratory failure with hypoxia (HCC)    Arthropathy of cervical facet joint 02/16/2017    Past Surgical History:  Procedure Laterality Date   ABDOMINAL HYSTERECTOMY     APPENDECTOMY     CATARACT EXTRACTION Bilateral    CERVICAL SPINE SURGERY     CORONARY ARTERY BYPASS GRAFT N/A 02/01/2024   Procedure: CORONARY ARTERY BYPASS GRAFTING X 3, USING LEFT INTERNAL MAMMARY ARTERY AND ENDOSCOPIC HARVESTED RIGHT SAPHENOUS VEIN, ENDARTERECTOMY LEFT ANTERIOR DESCENDING CORONARY ARTERY;  Surgeon: Shyrl Linnie KIDD, MD;  Location: [facility] OR;  Service: Open  Heart Surgery;  Laterality: N/A;   DILATION AND CURETTAGE OF UTERUS     x 2   FIRST RIB REMOVAL Right    HAND SURGERY Bilateral    IR THORACENTESIS ASP PLEURAL SPACE W/IMG GUIDE  03/15/2024   IR THORACENTESIS ASP PLEURAL SPACE W/IMG GUIDE  03/21/2024   KNEE ARTHROSCOPY W/ MENISCAL REPAIR Right    KNEE ARTHROSCOPY W/ MENISCAL REPAIR Left    LEFT HEART CATH AND CORONARY ANGIOGRAPHY N/A 01/15/2024   Procedure: LEFT HEART CATH AND CORONARY ANGIOGRAPHY;  Surgeon: Wonda Sharper, MD;  Location: [facility] INVASIVE CV LAB;  Service: Cardiovascular;  Laterality: N/A;   NEUROMA SURGERY Left    POSTERIOR CERVICAL FUSION/FORAMINOTOMY N/A 02/16/2017   Procedure: CERVICAL 1- CERVICAL 2 POSTERIOR INSTRUMENTATION AND FUSION;  Surgeon: Mavis Purchase, MD;  Location: [facility] OR;  Service: Neurosurgery;  Laterality: N/A;  CERVICAL 1- CERVICAL 2 POSTERIOR INSTRUMENTATION AND FUSION   SQUAMOUS CELL CARCINOMA EXCISION  2023   2 on right leg and 1 on nose.   TEE WITHOUT CARDIOVERSION N/A 02/01/2024   Procedure: ECHOCARDIOGRAM, TRANSESOPHAGEAL;  Surgeon: Shyrl Linnie KIDD, MD;  Location: [facility] OR;  Service: Open Heart Surgery;  Laterality: N/A;   TMJ ARTHROPLASTY      OB History   No obstetric history on file.      Home Medications    Prior  to Admission medications   Medication Sig Start Date End Date Taking? Authorizing Provider  amoxicillin (AMOXIL) 500 MG capsule Take 2 capsules (1,000 mg total) by mouth 3 (three) times daily for 7 days. 04/29/24 05/06/24 Yes Colleena Kurtenbach A, FNP  ALPRAZolam  (XANAX ) 0.5 MG tablet Take 0.5 mg by mouth 3 (three) times daily as needed for anxiety. 05/14/21   [provider]  amiodarone  (PACERONE ) 200 MG tablet Take 2 tablets (400 mg total) by mouth 2 (two) times daily for 1 week then reduce the dose to 1 tablet twice daily for 2 weeks then reduce the dose to 1 tablet daily. 02/09/24   Roddenberry, Myron G, PA-C  aspirin  EC 81 MG tablet Take 1 tablet (81 mg total) by mouth daily.  Swallow whole. 12/26/23   Goodrich, Callie E, PA-C  clopidogrel  (PLAVIX ) 75 MG tablet Take 1 tablet (75 mg total) by mouth daily. 02/09/24   Roddenberry, Myron G, PA-C  escitalopram  (LEXAPRO ) 10 MG tablet Take 10 mg by mouth daily. 11/23/23   [provider]  furosemide  (LASIX ) 40 MG tablet Take 1 tablet (40 mg total) by mouth as needed for fluid or edema. For weight gain of 3lbs in a day or 5lbs in a week or leg edema. 02/22/24   Goodrich, Callie E, PA-C  methylPREDNISolone  (MEDROL  DOSEPAK) 4 MG TBPK tablet Take 6 tablets on day 1, take 5 tablets on day 2, take 4 tablets on day 3, take 3 tablets on day 4, take 2 tablets on day 5, take 1 tablet on day 6 04/10/24   Rutha Manuelita HERO, PA-C  metoprolol  succinate (TOPROL -XL) 50 MG 24 hr tablet Take 1 tablet (50 mg total) by mouth daily. Take with or immediately following a meal. 02/09/24   Roddenberry, Myron G, PA-C  potassium chloride  SA (KLOR-CON  M) 20 MEQ tablet Take 1 tablet (20 mEq total) by mouth as needed (when taking lasix .). 02/22/24   Goodrich, Callie E, PA-C  rosuvastatin  (CRESTOR ) 40 MG tablet Take 1 tablet (40 mg total) by mouth daily. 12/26/23   Goodrich, Callie E, PA-C  telmisartan  (MICARDIS ) 80 MG tablet Take 1 tablet (80 mg total) by mouth daily. 03/18/24   Wonda Sharper, MD  traMADol  (ULTRAM ) 50 MG tablet Take 50 mg by mouth as needed for moderate pain (pain score 4-6).    [provider]    Family History History reviewed. No pertinent family history.  Social History Social History   Tobacco Use   Smoking status: Never   Smokeless tobacco: Never  Vaping Use   Vaping status: Never Used  Substance Use Topics   Alcohol use: Not Currently    Alcohol/week: 3.0 standard drinks of alcohol    Types: 3 Shots of liquor per week    Comment: 3 drinks a week.   Drug use: No     Allergies   Amlodipine    Review of Systems Review of Systems  Respiratory:  Positive for cough.      Physical Exam Triage Vital Signs ED  Triage Vitals  Encounter Vitals Group     BP 04/29/24 1021 (!) 172/81     Girls Systolic BP Percentile --      Girls Diastolic BP Percentile --      Boys Systolic BP Percentile --      Boys Diastolic BP Percentile --      Pulse Rate 04/29/24 1021 65     Resp 04/29/24 1021 20     Temp 04/29/24 1021 98.8 F (37.1  C)     Temp Source 04/29/24 1021 Oral     SpO2 04/29/24 1021 95 %     Weight --      Height --      Head Circumference --      Peak Flow --      Pain Score 04/29/24 1023 5     Pain Loc --      Pain Education --      Exclude from Growth Chart --    No data found.  Updated Vital Signs BP (!) 172/81 (BP Location: Right Arm)   Pulse 65   Temp 98.8 F (37.1 C) (Oral)   Resp 20   SpO2 95%   Visual Acuity Right Eye Distance:   Left Eye Distance:   Bilateral Distance:    Right Eye Near:   Left Eye Near:    Bilateral Near:     Physical Exam Constitutional:      General: She is not in acute distress.    Appearance: Normal appearance. She is not ill-appearing, toxic-appearing or diaphoretic.  HENT:     Head: Normocephalic and atraumatic.     Right Ear: Tympanic membrane and ear canal normal.     Left Ear: Tympanic membrane and ear canal normal.     Nose: Congestion present.     Mouth/Throat:     Pharynx: Oropharynx is clear.  Eyes:     Conjunctiva/sclera: Conjunctivae normal.  Cardiovascular:     Rate and Rhythm: Normal rate and regular rhythm.     Pulses: Normal pulses.     Heart sounds: Normal heart sounds.  Pulmonary:     Effort: Pulmonary effort is normal.     Breath sounds: Decreased breath sounds and rhonchi present.  Skin:    General: Skin is warm and dry.  Neurological:     Mental Status: She is alert.  Psychiatric:        Mood and Affect: Mood normal.      UC Treatments / Results  Labs (all labs ordered are listed, but only abnormal results are displayed) Labs Reviewed - No data to display  EKG   Radiology DG Chest 2 View Result  Date: 04/29/2024 EXAM: 2 VIEW(S) XRAY OF THE CHEST 04/29/2024 10:41:00 AM COMPARISON: PA and lateral radiographs of the chest dated 04/24/2024. CLINICAL HISTORY: cough, chest congestion. Sneezing, runny nose,chest congestion, productive cough x 1 week. Taking mucinex  with little relief. Open heart surgery on 7/10. Seen 8/21 for same. States has some CHF since heart surgery. States pain across sinus area of face FINDINGS: LUNGS AND PLEURA: Moderate persistent opacification of the left lung base with blunting of the left costophrenic angle, without significant change. No pulmonary edema. No pneumothorax. HEART AND MEDIASTINUM: The heart is moderately enlarged. Patient is status post CABG. BONES AND SOFT TISSUES: Surgical clips are noted in the right chest wall. Patient is status post ACDF of the lower cervical spine. No acute osseous abnormality. IMPRESSION: 1. Moderate persistent opacification at the left lung base with blunting of the left costophrenic angle, unchanged. 2. Moderate cardiomegaly, status post CABG. Electronically signed by: Evalene Coho MD 04/29/2024 11:02 AM EDT RP Workstation: [id number]    Procedures Procedures (including critical care time)  Medications Ordered in UC Medications - No data to display  Initial Impression / Assessment and Plan / UC Course  I have reviewed the triage vital signs and the nursing notes.  Pertinent labs & imaging results that were available during my care of the  patient were reviewed by me and considered in my medical decision making (see chart for details).     Acute cough- x ray unchanged from the one taken on the 1st of October. I believe that she has a sinus infection/upper respiratory infection. I am prescribing Amoxicillin to treat this. She can continue the mucinex  for cough, congestion.  Recommend see her PCP for any continued issues Final Clinical Impressions(s) / UC Diagnoses   Final diagnoses:  Acute cough     Discharge  Instructions      Take the amoxicillin as prescribed for sinus/respiratory infection.  Recommend Mucinex  for congestion and thin mucus.  Follow-up with your doctor for any continued issues     ED Prescriptions     Medication Sig Dispense Auth. Provider   amoxicillin (AMOXIL) 500 MG capsule Take 2 capsules (1,000 mg total) by mouth 3 (three) times daily for 7 days. 42 capsule Adah Corning A, FNP      PDMP not reviewed this encounter.   Adah Corning LABOR, FNP 04/29/24 1108

## 2024-04-29 NOTE — ED Triage Notes (Signed)
 Sneezing, runny nose,chest congestion, productive cough x 1 week. Taking mucinex  with little relief. Open heart surgery on 7/10. Seen 8/21 for same. States has some CHF since heart surgery. States pain across sinus area of face.

## 2024-04-29 NOTE — Telephone Encounter (Signed)
 Called patient to see if she was interested in participating in the Cardiac Rehab Program. Patient will come in for orientation on 10/28 and will attend the 11:45 exercise class.  Pensions consultant.

## 2024-05-01 ENCOUNTER — Other Ambulatory Visit (HOSPITAL_BASED_OUTPATIENT_CLINIC_OR_DEPARTMENT_OTHER): Payer: Self-pay

## 2024-05-01 MED ORDER — METOPROLOL SUCCINATE ER 50 MG PO TB24
50.0000 mg | ORAL_TABLET | Freq: Every day | ORAL | 2 refills | Status: DC
  Filled 2024-05-01: qty 90, 90d supply, fill #0

## 2024-05-01 NOTE — Telephone Encounter (Signed)
 Pt of Dr. Wonda. This RX was prescribed in the hospital. Also, at last OV with Callie Goodrich PA, it was noted that this RX may be stopped if no recurrence. Please advise.

## 2024-05-02 ENCOUNTER — Other Ambulatory Visit (HOSPITAL_BASED_OUTPATIENT_CLINIC_OR_DEPARTMENT_OTHER): Payer: Self-pay

## 2024-05-02 MED ORDER — AMIODARONE HCL 200 MG PO TABS
200.0000 mg | ORAL_TABLET | Freq: Every day | ORAL | 0 refills | Status: DC
  Filled 2024-05-02: qty 90, 90d supply, fill #0

## 2024-05-20 ENCOUNTER — Other Ambulatory Visit: Payer: Self-pay | Admitting: Thoracic Surgery (Cardiothoracic Vascular Surgery)

## 2024-05-20 ENCOUNTER — Other Ambulatory Visit: Payer: Self-pay | Admitting: *Deleted

## 2024-05-20 DIAGNOSIS — J90 Pleural effusion, not elsewhere classified: Secondary | ICD-10-CM

## 2024-05-20 DIAGNOSIS — Z951 Presence of aortocoronary bypass graft: Secondary | ICD-10-CM

## 2024-05-21 ENCOUNTER — Ambulatory Visit (HOSPITAL_COMMUNITY)
Admission: RE | Admit: 2024-05-21 | Discharge: 2024-05-21 | Disposition: A | Discharge status: Home / Self Care | Source: Ambulatory Visit | Attending: Cardiology | Admitting: Cardiology

## 2024-05-21 ENCOUNTER — Other Ambulatory Visit (HOSPITAL_COMMUNITY)

## 2024-05-21 ENCOUNTER — Telehealth (HOSPITAL_COMMUNITY): Payer: Self-pay

## 2024-05-21 ENCOUNTER — Other Ambulatory Visit (HOSPITAL_BASED_OUTPATIENT_CLINIC_OR_DEPARTMENT_OTHER): Payer: Self-pay

## 2024-05-21 ENCOUNTER — Other Ambulatory Visit: Payer: Self-pay | Admitting: Thoracic Surgery (Cardiothoracic Vascular Surgery)

## 2024-05-21 ENCOUNTER — Ambulatory Visit

## 2024-05-21 ENCOUNTER — Encounter (HOSPITAL_COMMUNITY): Admission: RE | Admit: 2024-05-21 | Source: Ambulatory Visit

## 2024-05-21 VITALS — BP 152/83 | HR 52 | Resp 18 | Ht 66.0 in | Wt 158.0 lb

## 2024-05-21 DIAGNOSIS — J90 Pleural effusion, not elsewhere classified: Secondary | ICD-10-CM

## 2024-05-21 DIAGNOSIS — Z951 Presence of aortocoronary bypass graft: Secondary | ICD-10-CM | POA: Diagnosis not present

## 2024-05-21 DIAGNOSIS — Z48812 Encounter for surgical aftercare following surgery on the circulatory system: Secondary | ICD-10-CM | POA: Diagnosis not present

## 2024-05-21 DIAGNOSIS — R918 Other nonspecific abnormal finding of lung field: Secondary | ICD-10-CM | POA: Diagnosis not present

## 2024-05-21 MED ORDER — DOXYCYCLINE HYCLATE 100 MG PO CAPS
100.0000 mg | ORAL_CAPSULE | Freq: Two times a day (BID) | ORAL | 0 refills | Status: AC
  Filled 2024-05-21: qty 14, 7d supply, fill #0

## 2024-05-21 NOTE — Progress Notes (Signed)
 [address] [place] [address]             [phone]       HPI: Patient returns for routine postoperative follow-up having undergone CABG X 3.  LIMA LAD, RSVG OM, diagonal LAD endarterectomy and endoscopic greater saphenous vein harvest on the right on 02/01/2024 with Dr. Shyrl.   She presents for follow up of left sided pleural effusion. She was seen by urgent care on 10/06 for URI and was prescribed amoxicillin for her symptoms. She had some improvement with the antibiotic.  She still is experiencing cough with sinus pressure and nasal congestion. She has been experiencing shortness of breath with exertion and when lying flat that has worsened over the past 3 weeks.  She denies chest pain and lower leg edema.   Current Outpatient Medications  Medication Sig Dispense Refill   ALPRAZolam  (XANAX ) 0.5 MG tablet Take 0.5 mg by mouth 3 (three) times daily as needed for anxiety.     amiodarone  (PACERONE ) 200 MG tablet Take 1 tablet (200 mg total) by mouth daily. *November appt for refills* 90 tablet 0   aspirin  EC 81 MG tablet Take 1 tablet (81 mg total) by mouth daily. Swallow whole.     clopidogrel  (PLAVIX ) 75 MG tablet Take 1 tablet (75 mg total) by mouth daily. 90 tablet 3   escitalopram  (LEXAPRO ) 10 MG tablet Take 10 mg by mouth daily.     furosemide  (LASIX ) 40 MG tablet Take 1 tablet (40 mg total) by mouth as needed for fluid or edema. For weight gain of 3lbs in a day or 5lbs in a week or leg edema. 30 tablet 3   methylPREDNISolone  (MEDROL  DOSEPAK) 4 MG TBPK tablet Take 6 tablets on day 1, take 5 tablets on day 2, take 4 tablets on day 3, take 3 tablets on day 4, take 2 tablets on day 5, take 1 tablet on day 6 21 each 0   metoprolol  succinate (TOPROL -XL) 50 MG 24 hr tablet Take 1 tablet (50 mg total) by mouth daily. Take with or immediately following a meal. 30 tablet 2   potassium chloride  SA (KLOR-CON  M) 20 MEQ tablet Take 1 tablet (20 mEq total) by  mouth as needed (when taking lasix .). 30 tablet 3   rosuvastatin  (CRESTOR ) 40 MG tablet Take 1 tablet (40 mg total) by mouth daily. 90 tablet 3   telmisartan  (MICARDIS ) 80 MG tablet Take 1 tablet (80 mg total) by mouth daily. 90 tablet 3   traMADol  (ULTRAM ) 50 MG tablet Take 50 mg by mouth as needed for moderate pain (pain score 4-6).     No current facility-administered medications for this visit.   Vitals:   05/21/24 0858  BP: (!) 152/83  Pulse: (!) 52  Resp: 18  SpO2: 95%    Review of Systems  Constitutional:  Negative for fever and malaise/fatigue.  HENT:  Positive for congestion and sinus pain.   Respiratory:  Positive for cough and shortness of breath. Negative for wheezing.        SOB with exertion  Cardiovascular:  Negative for chest pain, palpitations and leg swelling.    Physical Exam Constitutional:      Appearance: Normal appearance.  HENT:     Head: Normocephalic and atraumatic.  Cardiovascular:     Rate and Rhythm: Normal rate and regular rhythm.     Heart sounds: Normal heart sounds, S1 normal and S2  normal.  Pulmonary:     Effort: Pulmonary effort is normal.     Breath sounds: Examination of the left-lower field reveals decreased breath sounds. Decreased breath sounds present.  Musculoskeletal:     Cervical back: Normal range of motion.  Skin:    General: Skin is warm and dry.  Neurological:     General: No focal deficit present.     Mental Status: She is alert.      Diagnostic Tests: CLINICAL DATA:  Post CABG.  Effusion.   EXAM: CHEST - 2 VIEW   COMPARISON:  04/29/2024   FINDINGS: Post median sternotomy. Worsening large left pleural effusion occupying approximately 60% of the left hemithorax likely with associated compressive atelectasis. Subtle hazy prominence of the central right perihilar vessels possibly due to mild vascular congestion. Subtle confluent hazy density over the right upper lung as cannot exclude developing infection.  Cardiomediastinal silhouette and remainder of the exam is unchanged.   IMPRESSION: 1. Worsening large left pleural effusion occupying approximately 60% of the left hemithorax likely with associated compressive atelectasis. 2. Subtle hazy prominence of the central right perihilar vessels possibly due to mild vascular congestion. Subtle confluent hazy density over the right upper lung as cannot exclude developing infection.     Electronically Signed   By: Toribio Agreste M.D.   On: 05/21/2024 09:05     Plan:  S/P CABG (coronary artery bypass graft) and Pleural effusion -Large left pleural effusion on xray with compressive atelectasis -She is symptomatic of effusion with SOB on exertion and when lying flat -Thoracentesis scheduled for 05/22/2024 with interventional radiology -She is to start lasix  40 mg and potassium 20 meq daily for the next 2 weeks -Continue to be active and move around   -Prescribed doxycycline for lingering sinus infection symptoms  -Alarm symptoms discussed and she states understanding of proper follow up -Follow up in 2 weeks with chest xray    Manuelita CHRISTELLA Rough, PA-C [facility] [phone]

## 2024-05-21 NOTE — Patient Instructions (Signed)
-  Thoracentesis scheduled for 05/22/2024 -Follow up in 2 weeks with chest xray at our clinic

## 2024-05-21 NOTE — Telephone Encounter (Signed)
 Patient's husband called stating patient will not be in for 10:30 cardiac rehab orientation today, she is having fluid drawn from her lung. He confirmed they will call to reschedule once they 'get through what's going on with her'. Cancelled patient appts, removed from schedule.

## 2024-05-22 ENCOUNTER — Ambulatory Visit (HOSPITAL_COMMUNITY)
Admission: RE | Admit: 2024-05-22 | Discharge: 2024-05-22 | Disposition: A | Discharge status: Home / Self Care | Source: Ambulatory Visit | Attending: Radiology | Admitting: Radiology

## 2024-05-22 ENCOUNTER — Ambulatory Visit (HOSPITAL_COMMUNITY)
Admission: RE | Admit: 2024-05-22 | Discharge: 2024-05-22 | Disposition: A | Discharge status: Home / Self Care | Source: Ambulatory Visit | Attending: Thoracic Surgery (Cardiothoracic Vascular Surgery) | Admitting: Thoracic Surgery (Cardiothoracic Vascular Surgery)

## 2024-05-22 DIAGNOSIS — J90 Pleural effusion, not elsewhere classified: Secondary | ICD-10-CM | POA: Insufficient documentation

## 2024-05-22 MED ORDER — LIDOCAINE-EPINEPHRINE 1 %-1:100000 IJ SOLN
20.0000 mL | Freq: Once | INTRAMUSCULAR | Status: AC
  Administered 2024-05-22: 8 mL via INTRADERMAL

## 2024-05-22 MED ORDER — LIDOCAINE HCL 1 % IJ SOLN
INTRAMUSCULAR | Status: AC
  Filled 2024-05-22: qty 20

## 2024-05-22 MED ORDER — LIDOCAINE-EPINEPHRINE 1 %-1:100000 IJ SOLN
INTRAMUSCULAR | Status: AC
  Filled 2024-05-22: qty 1

## 2024-05-24 ENCOUNTER — Telehealth (HOSPITAL_COMMUNITY): Payer: Self-pay

## 2024-05-24 NOTE — Telephone Encounter (Signed)
 Will close pt CR referral until we hear back from pt to reschedule.   Closed referral

## 2024-05-27 ENCOUNTER — Encounter (HOSPITAL_COMMUNITY)

## 2024-05-29 ENCOUNTER — Encounter (HOSPITAL_COMMUNITY)

## 2024-05-30 ENCOUNTER — Ambulatory Visit

## 2024-06-03 ENCOUNTER — Other Ambulatory Visit: Payer: Self-pay | Admitting: Thoracic Surgery (Cardiothoracic Vascular Surgery)

## 2024-06-03 ENCOUNTER — Encounter (HOSPITAL_COMMUNITY)

## 2024-06-03 DIAGNOSIS — Z951 Presence of aortocoronary bypass graft: Secondary | ICD-10-CM

## 2024-06-04 ENCOUNTER — Encounter: Payer: Self-pay | Admitting: Cardiovascular Disease

## 2024-06-04 ENCOUNTER — Ambulatory Visit: Attending: Cardiovascular Disease | Admitting: Cardiovascular Disease

## 2024-06-04 ENCOUNTER — Ambulatory Visit (HOSPITAL_COMMUNITY)
Admission: RE | Admit: 2024-06-04 | Discharge: 2024-06-04 | Disposition: A | Discharge status: Home / Self Care | Source: Ambulatory Visit | Attending: Cardiovascular Disease | Admitting: Cardiovascular Disease

## 2024-06-04 ENCOUNTER — Ambulatory Visit

## 2024-06-04 VITALS — BP 110/69 | HR 59 | Resp 18 | Ht 66.0 in | Wt 158.0 lb

## 2024-06-04 VITALS — BP 150/70 | HR 49 | Ht 66.0 in | Wt 153.0 lb

## 2024-06-04 DIAGNOSIS — I5022 Chronic systolic (congestive) heart failure: Secondary | ICD-10-CM | POA: Diagnosis not present

## 2024-06-04 DIAGNOSIS — J90 Pleural effusion, not elsewhere classified: Secondary | ICD-10-CM | POA: Diagnosis not present

## 2024-06-04 DIAGNOSIS — I10 Essential (primary) hypertension: Secondary | ICD-10-CM

## 2024-06-04 DIAGNOSIS — I2510 Atherosclerotic heart disease of native coronary artery without angina pectoris: Secondary | ICD-10-CM | POA: Diagnosis not present

## 2024-06-04 DIAGNOSIS — I480 Paroxysmal atrial fibrillation: Secondary | ICD-10-CM | POA: Diagnosis not present

## 2024-06-04 DIAGNOSIS — Z951 Presence of aortocoronary bypass graft: Secondary | ICD-10-CM

## 2024-06-04 DIAGNOSIS — E782 Mixed hyperlipidemia: Secondary | ICD-10-CM

## 2024-06-04 DIAGNOSIS — I517 Cardiomegaly: Secondary | ICD-10-CM | POA: Diagnosis not present

## 2024-06-04 NOTE — Patient Instructions (Signed)
-  Follow up with Dr. Shyrl on 06/07/2024 with chest xray

## 2024-06-04 NOTE — Progress Notes (Signed)
 [address] [place] [address]             [phone]       HPI: Patient returns for routine postoperative follow-up having undergone CABG X 3.  LIMA LAD, RSVG OM, diagonal LAD endarterectomy and endoscopic greater saphenous vein harvest on the right on 02/01/2024 with Dr. Shyrl.   She presents for follow up of left sided pleural effusion.  She was seen 2 weeks ago and left sided effusion had increased in size. She was sent to IR and had thoracentesis which yielded 1 Liter of fluid.  She since has been taking lasix  daily.  She reports that her symptoms have improved but she still experiencing shortness of breath with exertion and when trying to bend down. She denies nasal congestion, cough, and wheezing.    Current Outpatient Medications  Medication Sig Dispense Refill   Alpha-Lipoic Acid 600 MG TABS Take 600 mg by mouth daily.     ALPRAZolam  (XANAX ) 0.5 MG tablet Take 0.5 mg by mouth 3 (three) times daily as needed for anxiety.     amiodarone  (PACERONE ) 200 MG tablet Take 1 tablet (200 mg total) by mouth daily. *November appt for refills* 90 tablet 0   aspirin  EC 81 MG tablet Take 1 tablet (81 mg total) by mouth daily. Swallow whole.     clopidogrel  (PLAVIX ) 75 MG tablet Take 1 tablet (75 mg total) by mouth daily. 90 tablet 3   escitalopram  (LEXAPRO ) 10 MG tablet Take 10 mg by mouth daily.     furosemide  (LASIX ) 40 MG tablet Take 1 tablet (40 mg total) by mouth as needed for fluid or edema. For weight gain of 3lbs in a day or 5lbs in a week or leg edema. 30 tablet 3   methylPREDNISolone  (MEDROL  DOSEPAK) 4 MG TBPK tablet Take 6 tablets on day 1, take 5 tablets on day 2, take 4 tablets on day 3, take 3 tablets on day 4, take 2 tablets on day 5, take 1 tablet on day 6 21 each 0   metoprolol  succinate (TOPROL -XL) 50 MG 24 hr tablet Take 1 tablet (50 mg total) by mouth daily. Take with or immediately following a meal. 30 tablet 2   potassium chloride  SA  (KLOR-CON  M) 20 MEQ tablet Take 1 tablet (20 mEq total) by mouth as needed (when taking lasix .). 30 tablet 3   rosuvastatin  (CRESTOR ) 40 MG tablet Take 1 tablet (40 mg total) by mouth daily. 90 tablet 3   telmisartan  (MICARDIS ) 80 MG tablet Take 1 tablet (80 mg total) by mouth daily. 90 tablet 3   traMADol  (ULTRAM ) 50 MG tablet Take 50 mg by mouth as needed for moderate pain (pain score 4-6).     No current facility-administered medications for this visit.   Vitals:   06/04/24 1158  BP: 110/69  Pulse: (!) 59  Resp: 18  SpO2: 99%    Review of Systems  Constitutional:  Negative for fever and malaise/fatigue.  HENT:  Negative for congestion and sinus pain.   Respiratory:  Positive for shortness of breath. Negative for cough and wheezing.        SOB with exertion  Cardiovascular:  Negative for chest pain, palpitations and leg swelling.    Physical Exam Constitutional:      Appearance: Normal appearance.  HENT:     Head: Normocephalic and atraumatic.  Cardiovascular:     Rate and Rhythm: Normal rate  and regular rhythm.     Heart sounds: Normal heart sounds, S1 normal and S2 normal.  Pulmonary:     Effort: Pulmonary effort is normal.     Breath sounds: Examination of the left-lower field reveals decreased breath sounds. Decreased breath sounds present.  Musculoskeletal:     Cervical back: Normal range of motion.  Skin:    General: Skin is warm and dry.  Neurological:     General: No focal deficit present.     Mental Status: She is alert.      Diagnostic Tests:  EXAM: 2 VIEW(S) XRAY OF THE CHEST 06/04/2024 12:28:23 PM   COMPARISON: Comparison 05/22/2024.   CLINICAL HISTORY: cabg   FINDINGS:   LUNGS AND PLEURA: Moderate left pleural effusion is noted which is increased compared to prior exam. No pulmonary edema. No pneumothorax.   HEART AND MEDIASTINUM: Stable cardiomegaly.   BONES AND SOFT TISSUES: No acute osseous abnormality.   IMPRESSION: 1. Stable  cardiomegaly. 2. Moderate left pleural effusion, increased from prior exam.   Electronically signed by: Lynwood Seip MD 06/04/2024 01:09 PM EST RP Workstation: [id number]      Plan:  S/P CABG (coronary artery bypass graft) and Pleural effusion -Discussed with Dr. Shyrl.  He will see patient in clinic Friday for further evaluation -She is to discontinue lasix  and only take for weight gain of 3 pounds in 1 day or 5 pounds in one week -Discussed possibility of Pleurex catheter and she is open to this since effusion keeps returning -She is to stay active and continue to move. Should attempt to interval walk 3 times a day minimum of 5 minutes -Continue to use incentive spirometer    Christine CHRISTELLA Rough, PA-C [facility] [phone]

## 2024-06-04 NOTE — Assessment & Plan Note (Signed)
 Status post CABG with no recurrent angina.  Continue aspirin  and clopidogrel  x 12 months, continue metoprolol  succinate, continue rosuvastatin .

## 2024-06-04 NOTE — Progress Notes (Signed)
 Cardiology Office Note:    Date:  06/04/2024   ID:  Christine Hart, DOB [birth year], MRN [id number]  PCP:  Street, Lonni HERO, MD   [facility] Providers Cardiologist:  Ozell Fell, MD Cardiology APP:  Goodrich, Callie E, PA-C     Referring MD: Street, Lonni HERO, *   Chief Complaint  Patient presents with   Coronary Artery Disease    History of Present Illness:    Christine Hart is a 78 y.o. female presenting for follow-up of CAD s/p CABG in July 2025. She presented with exertional angina and underwent a coronary CTA-FFR demonstrating severe diffuse LAD stenosis. She underwent CABG with LIMA-LAD, SVG-OM, and SVG-OM. She has had a recurrent pleural effusion on the left, but she reports some improvement in her breathing with activity. She has undergone 3 thoracentesis procedures and is scheduled to see Dr Shyrl in follow-up later this week.   Patient denies chest pain or pressure.  She has not had heart palpitations or any documentation of atrial fibrillation in her outpatient follow-up.  She has had no bleeding on aspirin  and clopidogrel .  She has had no symptoms of exertional chest pain or pressure.  She continues to have some shortness of breath but improved from past.  No leg edema.   Current Medications: Current Meds  Medication Sig   Alpha-Lipoic Acid 600 MG TABS Take 600 mg by mouth daily.   ALPRAZolam  (XANAX ) 0.5 MG tablet Take 0.5 mg by mouth 3 (three) times daily as needed for anxiety.   aspirin  EC 81 MG tablet Take 1 tablet (81 mg total) by mouth daily. Swallow whole.   clopidogrel  (PLAVIX ) 75 MG tablet Take 1 tablet (75 mg total) by mouth daily.   escitalopram  (LEXAPRO ) 10 MG tablet Take 10 mg by mouth daily.   metoprolol  succinate (TOPROL -XL) 50 MG 24 hr tablet Take 1 tablet (50 mg total) by mouth daily. Take with or immediately following a meal.   rosuvastatin  (CRESTOR ) 40 MG tablet Take 1 tablet (40 mg total) by mouth daily.   telmisartan   (MICARDIS ) 80 MG tablet Take 1 tablet (80 mg total) by mouth daily.   traMADol  (ULTRAM ) 50 MG tablet Take 50 mg by mouth as needed for moderate pain (pain score 4-6).   [DISCONTINUED] amiodarone  (PACERONE ) 200 MG tablet Take 1 tablet (200 mg total) by mouth daily. *November appt for refills*     Allergies:   Amlodipine    ROS:   Please see the history of present illness.    All other systems reviewed and are negative.  EKGs/Labs/Other Studies Reviewed:    The following studies were reviewed today: Cardiac Studies & Procedures   ______________________________________________________________________________________________ CARDIAC CATHETERIZATION  CARDIAC CATHETERIZATION 01/15/2024  Conclusion 1.  Patent left main with no significant stenosis 2.  Severe diffuse LAD stenosis with multiple lesions in the proximal, mid, and apical portions.  The LAD wraps around the apex and supplies much of the inferior wall. 3.  Severe mid circumflex and first obtuse marginal stenoses 4.  Moderate to severe proximal and mid RCA stenoses 5.  Vigorous LV systolic function with LVEF greater than 65% and no regional wall motion abnormalities, moderately elevated LVEDP  Reviewed findings with patient.  Recommend outpatient cardiac surgical evaluation for consideration of CABG.  Difficult anatomy with diffuse disease including distal vessel disease.  RCA supplies small territory of myocardium due to large wraparound LAD.  I think her treatment options are likely limited to CABG versus aggressive medical therapy as  she has very diffuse disease involvement for PCI.  Findings Coronary Findings Diagnostic  Dominance: Right  Left Main There is mild diffuse disease throughout the vessel. The left main is patent with no significant stenosis.  Left Anterior Descending The LAD has multiple severe stenoses.  The proximal vessel over the first diagonal has 75% stenosis, the mid vessel has a discrete 80 to 90%  stenosis, the apical vessel has a discrete 80 to 90% stenosis.  The first diagonal branch has severe diffuse proximal stenosis of 90%. Prox LAD to Mid LAD lesion is 75% stenosed. Mid LAD lesion is 90% stenosed. Dist LAD lesion is 80% stenosed.  First Diagonal Branch 1st Diag lesion is 90% stenosed.  Left Circumflex The circumflex has 75% eccentric stenosis in the mid vessel extending in to the PLA branch.  The first OM branch has severe diffuse distal vessel disease. Mid Cx to Dist Cx lesion is 75% stenosed.  First Obtuse Marginal Branch 1st Mrg lesion is 80% stenosed.  Right Coronary Artery The RCA is moderate in caliber.  There are segmental lesions in the proximal to mid vessel both appear to be in the 60 to 70% range.  The distal vessel was small in caliber with a small PDA branch. Prox RCA to Mid RCA lesion is 60% stenosed.  Intervention  No interventions have been documented.     ECHOCARDIOGRAM  ECHOCARDIOGRAM LIMITED 02/06/2024  Narrative ECHOCARDIOGRAM LIMITED REPORT    Patient Name:   Christine Hart Date of Exam: 02/06/2024 Medical Rec #:  [id number]        Height:       66.0 in Accession #:    [id number]       Weight:       181.4 lb Date of Birth:  1945-09-03        BSA:          1.919 m Patient Age:    78 years         BP:           133/68 mmHg Patient Gender: F                HR:           75 bpm. Exam Location:  Inpatient  Procedure: Limited Echo, Cardiac Doppler, Color Doppler and Intracardiac Opacification Agent (Both Spectral and Color Flow Doppler were utilized during procedure).  STAT ECHO  Indications:    R94.31 Abnormal EKG; R00.8 Other abnormalities of heart beat  History:        Patient has prior history of Echocardiogram examinations, most recent 01/18/2024. CAD, Prior CABG and Abnormal ECG, Signs/Symptoms:Dyspnea and Shortness of Breath; Risk Factors:Hypertension.  Sonographer:    Ellouise Mose RDCS Referring Phys: [id number] TINNIE FORBES FURTH   Sonographer Comments: Technically difficult study due to poor echo windows. Delay to get patient in chair IMPRESSIONS   1. There is no left ventricular thrombus (Definity  contrast was used). Left ventricular ejection fraction, by estimation, is 45 to 50%. The left ventricle has mildly decreased function. The left ventricle demonstrates regional wall motion abnormalities (see scoring diagram/findings for description). Left ventricular diastolic parameters are consistent with Grade II diastolic dysfunction (pseudonormalization). There is moderate hypokinesis of the left ventricular, mid-apical anterior wall and apical segment. 2. Right ventricular systolic function is normal. The right ventricular size is normal. The estimated right ventricular systolic pressure is 27.0 mmHg. 3. A small pericardial effusion is present. The pericardial effusion is localized near the right  ventricle. There is no evidence of cardiac tamponade. Large pleural effusion in the left lateral region. 4. The mitral valve is normal in structure. Mild mitral valve regurgitation. No evidence of mitral stenosis. 5. The aortic valve is tricuspid. There is mild calcification of the aortic valve. Aortic valve regurgitation is trivial. Aortic valve sclerosis/calcification is present, without any evidence of aortic stenosis. 6. The inferior vena cava is normal in size with greater than 50% respiratory variability, suggesting right atrial pressure of 3 mmHg.  Comparison(s): Prior images reviewed side by side. The left ventricular function is worsened. The left ventricular wall motion abnormality is new. It is compatible with either LAD artery distribution ischemia/infarction or with takotsubo syndrome (stress cardiomyopathy).  FINDINGS Left Ventricle: There is no left ventricular thrombus (Definity  contrast was used). Left ventricular ejection fraction, by estimation, is 45 to 50%. The left ventricle has mildly decreased  function. The left ventricle demonstrates regional wall motion abnormalities. Moderate hypokinesis of the left ventricular, mid-apical anterior wall and apical segment. Definity  contrast agent was given IV to delineate the left ventricular endocardial borders. The left ventricular internal cavity size was normal in size. There is no left ventricular hypertrophy. Left ventricular diastolic parameters are consistent with Grade II diastolic dysfunction (pseudonormalization).   LV Wall Scoring: The mid and distal anterior wall and entire apex are hypokinetic.  Right Ventricle: The right ventricular size is normal. No increase in right ventricular wall thickness. Right ventricular systolic function is normal. The tricuspid regurgitant velocity is 2.45 m/s, and with an assumed right atrial pressure of 3 mmHg, the estimated right ventricular systolic pressure is 27.0 mmHg.  Pericardium: A small pericardial effusion is present. The pericardial effusion is localized near the right ventricle. There is no evidence of cardiac tamponade. Presence of epicardial fat layer.  Mitral Valve: The mitral valve is normal in structure. Mild mitral valve regurgitation, with centrally-directed jet. No evidence of mitral valve stenosis.  Tricuspid Valve: The tricuspid valve is normal in structure. Tricuspid valve regurgitation is mild . No evidence of tricuspid stenosis.  Aortic Valve: The aortic valve is tricuspid. There is mild calcification of the aortic valve. Aortic valve regurgitation is trivial. Aortic valve sclerosis/calcification is present, without any evidence of aortic stenosis.  Aorta: The aortic root and ascending aorta are structurally normal, with no evidence of dilitation.  Venous: The inferior vena cava is normal in size with greater than 50% respiratory variability, suggesting right atrial pressure of 3 mmHg.  Additional Comments: There is a large pleural effusion in the left lateral region. Spectral  Doppler performed. Color Doppler performed.  LEFT VENTRICLE PLAX 2D LVIDd:         4.10 cm LVIDs:         2.80 cm LV PW:         1.20 cm LV IVS:        1.10 cm  LV Volumes (MOD) LV vol d, MOD A2C: 88.6 ml LV vol d, MOD A4C: 78.9 ml LV vol s, MOD A2C: 49.6 ml LV vol s, MOD A4C: 37.6 ml LV SV MOD A2C:     39.0 ml LV SV MOD A4C:     78.9 ml LV SV MOD BP:      43.7 ml  IVC IVC diam: 1.70 cm  AORTIC VALVE LVOT Vmax:   119.00 cm/s LVOT Vmean:  77.500 cm/s LVOT VTI:    0.222 m  AORTA Ao Asc diam: 3.60 cm  MITRAL VALVE  TRICUSPID VALVE MV Area (PHT): 4.39 cm     TR Peak grad:   24.0 mmHg MV Decel Time: 173 msec     TR Vmax:        245.00 cm/s MV E velocity: 102.00 cm/s MV A velocity: 80.40 cm/s   SHUNTS MV E/A ratio:  1.27         Systemic VTI: 0.22 m  Jerel Croitoru MD Electronically signed by Jerel Balding MD Signature Date/Time: 02/06/2024/11:47:12 AM    Final   TEE  ECHO INTRAOPERATIVE TEE 02/01/2024  Narrative *INTRAOPERATIVE TRANSESOPHAGEAL REPORT *    Patient Name:   TASHIYA SOUDERS Date of Exam: 02/01/2024 Medical Rec #:  [id number]        Height:       66.0 in Accession #:    [id number]       Weight:       169.0 lb Date of Birth:  09-28-1945        BSA:          1.86 m Patient Age:    78 years         BP:           147/55 mmHg Patient Gender: F                HR:           55 bpm. Exam Location:  Anesthesiology  Transesophogeal exam was perform intraoperatively during surgical procedure. Patient was closely monitored under general anesthesia during the entirety of examination.  Indications:     CAD Performing Phys: Franky Bald MD Diagnosing Phys: Franky Bald MD  Complications: No known complications during this procedure. POST-OP IMPRESSIONS s/p CABG x3 _ Left Ventricle: LVEF unchanged, no RWMA's, CO wnl, CI wnl. _ Right Ventricle: The right ventricle appears unchanged from pre-bypass. _ Aorta: Unchanged, no dissection  noted. _ Left Atrium: The left atrium appears unchanged from pre-bypass. _ Left Atrial Appendage: The left atrial appendage appears unchanged from pre-bypass. _ Aortic Valve: The aortic valve appears unchanged from pre-bypass. _ Mitral Valve: The mitral valve appears unchanged from pre-bypass. _ Tricuspid Valve: The tricuspid valve appears unchanged from pre-bypass. _ Pulmonic Valve: The pulmonic valve appears unchanged from pre-bypass. _ Interatrial Septum: The interatrial septum appears unchanged from pre-bypass. _ Interventricular Septum: The interventricular septum appears unchanged from pre-bypass. _ Pericardium: The pericardium appears unchanged from pre-bypass.  PRE-OP FINDINGS Left Ventricle: The left ventricle has hyperdynamic systolic function, with an ejection fraction of >65%. The cavity size was normal. No evidence of left ventricular regional wall motion abnormalities. There is no left ventricular hypertrophy. Left ventricular diastolic function could not be evaluated.  Right Ventricle: The right ventricle has normal systolic function. The cavity was normal. There is no increase in right ventricular wall thickness. There is no aneurysm seen.  Left Atrium: Left atrial size was normal in size. No left atrial/left atrial appendage thrombus was detected. The left atrial appendage is well visualized and there is no evidence of thrombus present.  Right Atrium: Right atrial size was normal in size.  Interatrial Septum: No atrial level shunt detected by color flow Doppler. There is no evidence of a patent foramen ovale.  Pericardium: There is no evidence of pericardial effusion. There is no pleural effusion.  Mitral Valve: The mitral valve is normal in structure. Mitral valve regurgitation is trivial by color flow Doppler. The MR jet is centrally-directed. There is no evidence of mitral valve vegetation. There is No evidence  of mitral stenosis.  Tricuspid Valve: The tricuspid valve  was normal in structure. Tricuspid valve regurgitation is trivial to mild by color flow Doppler. No evidence of tricuspid stenosis is present.  Aortic Valve: The aortic valve is tricuspid Aortic valve regurgitation was not visualized by color flow Doppler. There is no stenosis of the aortic valve. There is no evidence of aortic valve vegetation.  Pulmonic Valve: The pulmonic valve was normal in structure. Pulmonic valve regurgitation is not visualized by color flow Doppler.   Aorta: The aortic root, ascending aorta and aortic arch are normal in size and structure.  Pulmonary Artery: The pulmonary artery is of normal size.  Shunts: There is no evidence of an atrial septal defect.   Franky Bald MD Electronically signed by Franky Bald MD Signature Date/Time: 02/01/2024/3:10:09 PM    Final    CT SCANS  CT CORONARY MORPH W/CTA COR W/SCORE 12/25/2023  Addendum 12/31/2023  9:00 PM ADDENDUM REPORT: 12/31/2023 20:58  EXAM: OVER-READ INTERPRETATION  CT CHEST  The following report is an over-read performed by radiologist Dr. Fonda Mom [facility] on 12/31/2023. This over-read does not include interpretation of cardiac or coronary anatomy or pathology. The coronary CTA interpretation by the cardiologist is attached.  COMPARISON:  10/29/2021.  FINDINGS: Cardiovascular:  See findings discussed in the body of the report.  Mediastinum/Nodes: No suspicious adenopathy identified. Imaged mediastinal structures are unremarkable.  Lungs/Pleura: Imaged lungs are clear. No pleural effusion or pneumothorax.  Upper Abdomen: No acute abnormality.  Musculoskeletal: No chest wall abnormality. No acute osseous findings.  IMPRESSION: No acute extracardiac incidental findings.   Electronically Signed By: Fonda Field M.D. On: 12/31/2023 20:58  Narrative HISTORY: Chest pain, nonspecific  EXAM: Cardiac/Coronary  CT  PROTOCOL: A non-contrast, gated CT scan was  obtained with axial slices of 2.5 mm through the heart for calcium  scoring. Calcium  scoring was performed using the Agatston method. A 120 kV prospective, gated, contrast cardiac CT scan was obtained. Gantry rotation speed was 230 msec and collimation was 0.63 mm. Two sublingual nitroglycerin  tablets (0.8 mg) were given. The 3D data set was reconstructed with motion correction for the best systolic or diastolic phase. Images were analyzed on a dedicated workstation using MPR, MIP, and VRT modes. The patient received 95 cc of contrast.  FINDINGS: Image quality: Average.  Artifact: Limited - blooming artifact.  Coronary calcium  score is 1082. Unclear race precludes accurate percentile analysis.  Coronary arteries: Normal coronary origins.  co-dominance.  Left Main Coronary Artery: Normal caliber vessel, originates from the left coronary cusp, bifurcates to form a left anterior descending artery (LAD) and a left circumflex artery (LCX). Left main is patent.  Left Anterior Descending Artery: Normal caliber vessel, wraps the apex, gives off 3 diagonal branches. Minimal calcified plaque (< 25%) at the ostial LAD. Severe stenosis (70-99%) at the proximal/mid LAD due to mixed plaque. Moderate stenosis (50-69%) at the mid LAD due to predominantly calcified plaque. Moderate stenosis (50-69%) at the distal LAD due to noncalcified plaque. Moderate to severe stenosis at the apical LAD due to mixed plaque.  First Diagonal branch: Small, patent  Second Diagonal branch: Normal caliber, normal size, severe stenosis (70-99%) due to mixed plaque at the ostial segment. Diffuse disease noted within the proximal to distal segments as well.  Third Diagonal branch: Not well visualized  Left Circumflex Artery: Normal caliber vessel, co-dominant, travels within the atrioventricular groove, gives off 2 obtuse marginal branches. Minimal calcified plaque (< 25%) at the ostial LCX.  Moderate stenosis  (50-69%) due to calcified plaque at proximal/mid LCX. Moderate stenosis (50-69%) due to soft plaque at mid LCX, remainder of the vessel is patent.  First Obtuse Marginal branch: Small, patent  Second Obtuse Marginal branch: Moderate stenosis (50-69%) at the ostium with diffuse luminal irregularities distally.  Right Coronary Artery: Co-dominant vessel, originates from the right coronary cusp, mild stenosis (25-49%) due to noncalcified plaque in the proximal to mid segments remainder of the vessel is patent.  Plaque Analysis:  Calcified plaque 189 mm3  Non-calcified plaque 572 mm3 (low attenuation plaque 8 mm3).  Total plaque volume (TPV) is 761 mm3, categorized as extensive, which is 85th percentile for age- and sex-matched controls.  Aorta: Normal size, 37 mm at the mid ascending aorta (level of the PA bifurcation) measured double oblique.  Aortic Valve: Native valve, trileaflet aortic valve, no significant calcification.  Mitral valve: Native valve, no significant mitral annular calcification.  Other findings:  Normal pulmonary vein drainage into the left atrium.  Normal left atrial appendage without thrombus.  Normal size of the pulmonary artery.  Patent foramen ovale.  Please see separate report from [facility] for non-cardiac findings.  IMPRESSION: 1. Coronary calcium  score is 1082. Unclear race precludes accurate percentile analysis.  2. Total plaque volume (TPV) is 761 mm3, categorized as extensive, which is 85th percentile for age- and sex-matched controls.  3. Normal coronary origins with right dominance.  4. CAD-RADS 4 Severe coronary artery disease.  5. Severe stenosis (70-99%) at the proximal/mid LAD due to mixed plaque.  6. Moderate stenosis (50-69%) due to soft plaque at mid LCX.  7. Moderate stenosis (50-69%) at the ostium of OM2.  8. Severe stenosis (70-99%) due to mixed plaque at the ostial D2 segment.  9. Patent foramen  ovale.  10. CT FFR will be performed and reported separately.  RECOMMENDATION: 1.  CT FFR requested and reported separately.  2. Consider symptom-guided anti-ischemic pharmacotherapy as well as risk factor modification per guideline directed care.  Electronically Signed: By: Madonna Large On: 12/26/2023 13:13     ______________________________________________________________________________________________      EKG:        Recent Labs: 02/08/2024: Magnesium  2.1 02/22/2024: ALT 17; BUN 14; Creatinine, Ser 0.85; Hemoglobin 10.6; Platelets 353; Potassium 4.1; Sodium 140  Recent Lipid Panel    Component Value Date/Time   LDLDIRECT 47 02/22/2024 1214             Physical Exam:    VS:  BP (!) 150/70 (BP Location: Right Arm, Patient Position: Sitting, Cuff Size: Normal)   Pulse (!) 49   Ht 5' 6 (1.676 m)   Wt 153 lb (69.4 kg)   SpO2 94%   BMI 24.69 kg/m     Wt Readings from Last 3 Encounters:  06/04/24 153 lb (69.4 kg)  06/04/24 158 lb (71.7 kg)  05/21/24 158 lb (71.7 kg)     GEN:  Well nourished, well developed in no acute distress HEENT: Normal NECK: No JVD; No carotid bruits LYMPHATICS: No lymphadenopathy CARDIAC: RRR, no murmurs, rubs, gallops RESPIRATORY: Breath sounds slightly diminished in the left base but overall good air movement ABDOMEN: Soft, non-tender, non-distended MUSCULOSKELETAL:  No edema; No deformity  SKIN: Warm and dry NEUROLOGIC:  Alert and oriented x 3 PSYCHIATRIC:  Normal affect   Assessment & Plan Coronary artery disease involving native coronary artery of native heart without angina pectoris Status post CABG with no recurrent angina.  Continue aspirin  and clopidogrel  x 12 months, continue  metoprolol  succinate, continue rosuvastatin . Chronic heart failure with mildly reduced ejection fraction (HFmrEF, 41-49%) (HCC) Patient treated with telmisartan  and metoprolol  succinate.  Update 2D echo.  Preoperatively, she had vigorous LV function  and had mild LV dysfunction postoperatively.  She is now 4 months out from surgery and hopefully with revascularization and medical therapy her LVEF has normalized. Mixed hyperlipidemia Treated with rosuvastatin .  Direct LDL February 22, 2024 was 47.  Continue current management. Essential hypertension I reviewed her home blood pressure readings and they are in good range.  She has a few systolic blood pressures greater than 140 mmHg, but the vast majority are at goal and she should continue telmisartan  and metoprolol  succinate at current doses. Pleural effusion on left Management per Dr. Shyrl.  Appreciate cardiac surgical team's care. Post-op atrial fibrillation Patient now 4 months out from CABG.  She is maintaining sinus rhythm.  I advised her to stop amiodarone .      Medication Adjustments/Labs and Tests Ordered: Current medicines are reviewed at length with the patient today.  Concerns regarding medicines are outlined above.  Orders Placed This Encounter  Procedures   ECHOCARDIOGRAM COMPLETE   No orders of the defined types were placed in this encounter.   Patient Instructions  Medication Instructions:  STOP Amiodarone    *If you need a refill on your cardiac medications before your next appointment, please call your pharmacy*  Lab Work: None ordered today. If you have labs (blood work) drawn today and your tests are completely normal, you will receive your results only by: MyChart Message (if you have MyChart) OR A paper copy in the mail If you have any lab test that is abnormal or we need to change your treatment, we will call you to review the results.  Testing/Procedures: Your physician has requested that you have an echocardiogram. Echocardiography is a painless test that uses sound waves to create images of your heart. It provides your doctor with information about the size and shape of your heart and how well your heart's chambers and valves are working. This procedure  takes approximately one hour. There are no restrictions for this procedure. Please do NOT wear cologne, perfume, aftershave, or lotions (deodorant is allowed). Please arrive 15 minutes prior to your appointment time.  Please note: We ask at that you not bring children with you during ultrasound (echo/ vascular) testing. Due to room size and safety concerns, children are not allowed in the ultrasound rooms during exams. Our front office staff cannot provide observation of children in our lobby area while testing is being conducted. An adult accompanying a patient to their appointment will only be allowed in the ultrasound room at the discretion of the ultrasound technician under special circumstances. We apologize for any inconvenience.   Follow-Up: At [facility], you and your health needs are our priority.  As part of our continuing mission to provide you with exceptional heart care, our providers are all part of one team.  This team includes your primary Cardiologist (physician) and Advanced Practice Providers or APPs (Physician Assistants and Nurse Practitioners) who all work together to provide you with the care you need, when you need it.  Your next appointment:   3 month(s)  Provider:   One of our Advanced Practice Providers (APPs): Morse Clause, PA-C  Lamarr Satterfield, NP Miriam Shams, NP  Olivia Pavy, PA-C Josefa Beauvais, NP  Leontine Salen, PA-C Orren Fabry, PA-C  [place], [place] Jackee Alberts, NP  Damien Braver, NP Jon Hails,  PA-C  Waddell Donath, PA-C    Dayna Dunn, PA-C  Scott Weaver, PA-C Lum Louis, NP Katlyn West, NP Callie Goodrich, PA-C  Xika Zhao, NP Thom Sluder, PA-C    Rollo Louder, PA-C     Signed, Ozell Fell, MD  06/04/2024 5:35 PM    [facility]

## 2024-06-04 NOTE — Patient Instructions (Signed)
 Medication Instructions:  STOP Amiodarone    *If you need a refill on your cardiac medications before your next appointment, please call your pharmacy*  Lab Work: None ordered today. If you have labs (blood work) drawn today and your tests are completely normal, you will receive your results only by: MyChart Message (if you have MyChart) OR A paper copy in the mail If you have any lab test that is abnormal or we need to change your treatment, we will call you to review the results.  Testing/Procedures: Your physician has requested that you have an echocardiogram. Echocardiography is a painless test that uses sound waves to create images of your heart. It provides your doctor with information about the size and shape of your heart and how well your heart's chambers and valves are working. This procedure takes approximately one hour. There are no restrictions for this procedure. Please do NOT wear cologne, perfume, aftershave, or lotions (deodorant is allowed). Please arrive 15 minutes prior to your appointment time.  Please note: We ask at that you not bring children with you during ultrasound (echo/ vascular) testing. Due to room size and safety concerns, children are not allowed in the ultrasound rooms during exams. Our front office staff cannot provide observation of children in our lobby area while testing is being conducted. An adult accompanying a patient to their appointment will only be allowed in the ultrasound room at the discretion of the ultrasound technician under special circumstances. We apologize for any inconvenience.   Follow-Up: At [facility], you and your health needs are our priority.  As part of our continuing mission to provide you with exceptional heart care, our providers are all part of one team.  This team includes your primary Cardiologist (physician) and Advanced Practice Providers or APPs (Physician Assistants and Nurse Practitioners) who all work together  to provide you with the care you need, when you need it.  Your next appointment:   3 month(s)  Provider:   One of our Advanced Practice Providers (APPs): Morse Clause, PA-C  Lamarr Satterfield, NP Miriam Shams, NP  Olivia Pavy, PA-C Josefa Beauvais, NP  Leontine Salen, PA-C Orren Fabry, PA-C  [place], PA-C Ernest Dick, NP  Damien Braver, NP Jon Hails, PA-C  Waddell Donath, PA-C    Dayna Dunn, PA-C  Scott Weaver, PA-C Lum Louis, NP Katlyn West, NP Callie Goodrich, PA-C  Xika Zhao, NP Sheng Haley, PA-C    Kathleen Johnson, PA-C

## 2024-06-05 ENCOUNTER — Encounter (HOSPITAL_COMMUNITY)

## 2024-06-05 DIAGNOSIS — D485 Neoplasm of uncertain behavior of skin: Secondary | ICD-10-CM | POA: Diagnosis not present

## 2024-06-05 DIAGNOSIS — C4401 Basal cell carcinoma of skin of lip: Secondary | ICD-10-CM | POA: Diagnosis not present

## 2024-06-07 ENCOUNTER — Ambulatory Visit: Admitting: Thoracic Surgery (Cardiothoracic Vascular Surgery)

## 2024-06-10 ENCOUNTER — Encounter (HOSPITAL_COMMUNITY)

## 2024-06-11 ENCOUNTER — Other Ambulatory Visit (HOSPITAL_BASED_OUTPATIENT_CLINIC_OR_DEPARTMENT_OTHER): Payer: Self-pay

## 2024-06-12 ENCOUNTER — Encounter (HOSPITAL_COMMUNITY)

## 2024-06-13 ENCOUNTER — Other Ambulatory Visit: Payer: Self-pay | Admitting: Thoracic Surgery (Cardiothoracic Vascular Surgery)

## 2024-06-13 DIAGNOSIS — Z951 Presence of aortocoronary bypass graft: Secondary | ICD-10-CM

## 2024-06-14 ENCOUNTER — Ambulatory Visit: Admitting: Thoracic Surgery (Cardiothoracic Vascular Surgery)

## 2024-06-14 ENCOUNTER — Ambulatory Visit
Admission: RE | Admit: 2024-06-14 | Discharge: 2024-06-14 | Disposition: A | Discharge status: Home / Self Care | Source: Ambulatory Visit | Attending: Cardiology | Admitting: Cardiology

## 2024-06-14 VITALS — BP 181/81 | HR 67 | Resp 18 | Ht 66.0 in | Wt 156.0 lb

## 2024-06-14 DIAGNOSIS — J90 Pleural effusion, not elsewhere classified: Secondary | ICD-10-CM

## 2024-06-14 DIAGNOSIS — Z951 Presence of aortocoronary bypass graft: Secondary | ICD-10-CM | POA: Diagnosis not present

## 2024-06-14 DIAGNOSIS — I517 Cardiomegaly: Secondary | ICD-10-CM | POA: Diagnosis not present

## 2024-06-14 NOTE — H&P (View-Only) (Signed)
      [address] [place] [address]             [id number]       Christine Hart [facility] Medical Record #[id number] Date of Birth: [birth year]  Referring: Wonda Sharper, MD Primary Care: Street, Lonni HERO, MD Primary Cardiologist:Michael Wonda, MD  Reason for visit:   follow-up  History of Present Illness:     Christine Hart present for evaluation of her chronic left pleural effusion.  She has undergone thoracentesis 3 times and been treated with steroids, but chest x-ray today still shows a moderate-sized effusion.  She is symptomatic with exertion, and positions.  Physical Exam: BP (!) 181/81 (BP Location: Right Arm)   Pulse 67   Resp 18   Ht 5' 6 (1.676 m)   Wt 156 lb (70.8 kg)   SpO2 97%   BMI 25.18 kg/m   Alert NAD Abdomen, ND No peripheral edema   Diagnostic Studies & Laboratory data: CXR:  Moderate left effusion     Assessment / Plan:   78 y.o. female status post CABG earlier this summer, but has had a chronic left effusion.  We discussed several options for treatment of this including PleurX catheter drainage versus left VATS with pleurodesis.  She would like some time to consider her options.  She will call us  back to let us  know her decision   Christine Hart 06/14/2024 3:50 PM

## 2024-06-14 NOTE — Progress Notes (Signed)
      [address] [place] [address]             [id number]       Christine Hart [facility] Medical Record #[id number] Date of Birth: [birth year]  Referring: Wonda Sharper, MD Primary Care: Street, Lonni HERO, MD Primary Cardiologist:Michael Wonda, MD  Reason for visit:   follow-up  History of Present Illness:     Christine Hart present for evaluation of her chronic left pleural effusion.  She has undergone thoracentesis 3 times and been treated with steroids, but chest x-ray today still shows a moderate-sized effusion.  She is symptomatic with exertion, and positions.  Physical Exam: BP (!) 181/81 (BP Location: Right Arm)   Pulse 67   Resp 18   Ht 5' 6 (1.676 m)   Wt 156 lb (70.8 kg)   SpO2 97%   BMI 25.18 kg/m   Alert NAD Abdomen, ND No peripheral edema   Diagnostic Studies & Laboratory data: CXR:  Moderate left effusion     Assessment / Plan:   78 y.o. female status post CABG earlier this summer, but has had a chronic left effusion.  We discussed several options for treatment of this including PleurX catheter drainage versus left VATS with pleurodesis.  She would like some time to consider her options.  She will call us  back to let us  know her decision   Christine Hart 06/14/2024 3:50 PM

## 2024-06-17 ENCOUNTER — Other Ambulatory Visit: Payer: Self-pay | Admitting: *Deleted

## 2024-06-17 ENCOUNTER — Other Ambulatory Visit (HOSPITAL_BASED_OUTPATIENT_CLINIC_OR_DEPARTMENT_OTHER): Payer: Self-pay

## 2024-06-17 ENCOUNTER — Encounter (HOSPITAL_COMMUNITY)

## 2024-06-17 ENCOUNTER — Encounter: Payer: Self-pay | Admitting: *Deleted

## 2024-06-17 DIAGNOSIS — J90 Pleural effusion, not elsewhere classified: Secondary | ICD-10-CM

## 2024-06-18 ENCOUNTER — Other Ambulatory Visit: Payer: Self-pay | Admitting: *Deleted

## 2024-06-19 ENCOUNTER — Encounter (HOSPITAL_COMMUNITY)

## 2024-06-21 NOTE — Pre-Procedure Instructions (Signed)
 Surgical Instructions   Your procedure is scheduled on December 2nd.   Report to [facility] Main Entrance A at 1200 P.M., then check in with the Admitting office. Any questions or running late day of surgery: call [phone]  Questions prior to your surgery date: call [phone], Monday-Friday, 8am-4pm. If you experience any cold or flu symptoms such as cough, fever, chills, shortness of breath, etc. between now and your scheduled surgery, please notify us  at the above number.     Remember:  Do not eat or drink after midnight the night before your surgery   Take these medicines the morning of surgery with A SIP OF WATER  escitalopram  (LEXAPRO )  metoprolol  succinate (TOPROL -XL)  rosuvastatin  (CRESTOR )   May take these medicines IF NEEDED: ALPRAZolam  (XANAX )  traMADol  (ULTRAM )   Stop clopidogrel  (PLAVIX ) for 5 days prior to surgery. Last dose should be on November 26th.   Please follow your surgeon's instructions on when to stop Aspirin . If no instructions were given - please call their office.    One week prior to surgery, STOP taking any Aleve, Naproxen, Ibuprofen , Motrin , Advil , Goody's, BC's, all herbal medications, fish oil, and non-prescription vitamins.                     Do NOT Smoke (Tobacco/Vaping) for 24 hours prior to your procedure.  If you use a CPAP at night, you may bring your mask/headgear for your overnight stay.   You will be asked to remove any contacts, glasses, piercing's, hearing aid's, dentures/partials prior to surgery. Please bring cases for these items if needed.    Patients discharged the day of surgery will not be allowed to drive home, and someone needs to stay with them for 24 hours.  SURGICAL WAITING ROOM VISITATION Patients may have no more than 2 support people in the waiting area - these visitors may rotate.   Pre-op nurse will coordinate an appropriate time for 1 ADULT support person, who may not rotate, to accompany patient in  pre-op.  Children under the age of 23 must have an adult with them who is not the patient and must remain in the main waiting area with an adult.  If the patient needs to stay at the hospital during part of their recovery, the visitor guidelines for inpatient rooms apply.  Please refer to the [facility] website for the visitor guidelines for any additional information.   If you received a COVID test during your pre-op visit  it is requested that you wear a mask when out in public, stay away from anyone that may not be feeling well and notify your surgeon if you develop symptoms. If you have been in contact with anyone that has tested positive in the last 10 days please notify you surgeon.      Pre-operative CHG Bathing Instructions   You can play a key role in reducing the risk of infection after surgery. Your skin needs to be as free of germs as possible. You can reduce the number of germs on your skin by washing with CHG (chlorhexidine  gluconate) soap before surgery. CHG is an antiseptic soap that kills germs and continues to kill germs even after washing.   DO NOT use if you have an allergy to chlorhexidine /CHG or antibacterial soaps. If your skin becomes reddened or irritated, stop using the CHG and notify one of our RNs at [phone].              TAKE A  SHOWER THE NIGHT BEFORE SURGERY   Please keep in mind the following:  DO NOT shave, including legs and underarms, 48 hours prior to surgery.   You may shave your face before/day of surgery.  Place clean sheets on your bed the night before surgery Use a clean washcloth (not used since being washed) for shower. DO NOT sleep with pet's night before surgery.  CHG Shower Instructions:  Wash your face and private area with normal soap. If you choose to wash your hair, wash first with your normal shampoo.  After you use shampoo/soap, rinse your hair and body thoroughly to remove shampoo/soap residue.  Turn the water OFF and apply half  the bottle of CHG soap to a CLEAN washcloth.  Apply CHG soap ONLY FROM YOUR NECK DOWN TO YOUR TOES (washing for 3-5 minutes)  DO NOT use CHG soap on face, private areas, open wounds, or sores.  Pay special attention to the area where your surgery is being performed.  If you are having back surgery, having someone wash your back for you may be helpful. Wait 2 minutes after CHG soap is applied, then you may rinse off the CHG soap.  Pat dry with a clean towel  Put on clean pajamas    Additional instructions for the day of surgery: If you choose, you may shower the morning of surgery with an antibacterial soap.  DO NOT APPLY any lotions, deodorants, cologne, or perfumes.   Do not wear jewelry or makeup Do not wear nail polish, gel polish, artificial nails, or any other type of covering on natural nails (fingers and toes) Do not bring valuables to the hospital. [facility] is not responsible for valuables/personal belongings. Put on clean/comfortable clothes.  Please brush your teeth.  Ask your nurse before applying any prescription medications to the skin.

## 2024-06-24 ENCOUNTER — Encounter (HOSPITAL_COMMUNITY): Payer: Self-pay

## 2024-06-24 ENCOUNTER — Other Ambulatory Visit: Payer: Self-pay

## 2024-06-24 ENCOUNTER — Encounter (HOSPITAL_COMMUNITY)
Admission: RE | Admit: 2024-06-24 | Discharge: 2024-06-24 | Disposition: A | Source: Ambulatory Visit | Attending: Thoracic Surgery (Cardiothoracic Vascular Surgery)

## 2024-06-24 ENCOUNTER — Encounter (HOSPITAL_COMMUNITY)

## 2024-06-24 ENCOUNTER — Ambulatory Visit (HOSPITAL_COMMUNITY)
Admission: RE | Admit: 2024-06-24 | Discharge: 2024-06-24 | Disposition: A | Source: Ambulatory Visit | Attending: Thoracic Surgery (Cardiothoracic Vascular Surgery)

## 2024-06-24 VITALS — BP 157/79 | HR 56 | Temp 98.3°F | Resp 18 | Ht 66.0 in | Wt 158.4 lb

## 2024-06-24 DIAGNOSIS — J90 Pleural effusion, not elsewhere classified: Secondary | ICD-10-CM

## 2024-06-24 DIAGNOSIS — Z01818 Encounter for other preprocedural examination: Secondary | ICD-10-CM | POA: Diagnosis not present

## 2024-06-24 DIAGNOSIS — R918 Other nonspecific abnormal finding of lung field: Secondary | ICD-10-CM | POA: Diagnosis not present

## 2024-06-24 HISTORY — DX: Tachycardia, unspecified: R00.0

## 2024-06-24 HISTORY — DX: Atherosclerotic heart disease of native coronary artery without angina pectoris: I25.10

## 2024-06-24 HISTORY — DX: Acute poliomyelitis, unspecified: A80.9

## 2024-06-24 LAB — URINALYSIS, ROUTINE W REFLEX MICROSCOPIC
Bilirubin Urine: NEGATIVE
Glucose, UA: NEGATIVE mg/dL
Ketones, ur: NEGATIVE mg/dL
Leukocytes,Ua: NEGATIVE
Nitrite: NEGATIVE
Protein, ur: 100 mg/dL — AB
Specific Gravity, Urine: 1.025 (ref 1.005–1.030)
pH: 6 (ref 5.0–8.0)

## 2024-06-24 LAB — CBC
HCT: 43 % (ref 36.0–46.0)
Hemoglobin: 13 g/dL (ref 12.0–15.0)
MCH: 25.5 pg — ABNORMAL LOW (ref 26.0–34.0)
MCHC: 30.2 g/dL (ref 30.0–36.0)
MCV: 84.5 fL (ref 80.0–100.0)
Platelets: 268 K/uL (ref 150–400)
RBC: 5.09 MIL/uL (ref 3.87–5.11)
RDW: 17.9 % — ABNORMAL HIGH (ref 11.5–15.5)
WBC: 7.6 K/uL (ref 4.0–10.5)
nRBC: 0 % (ref 0.0–0.2)

## 2024-06-24 LAB — URINALYSIS, MICROSCOPIC (REFLEX)

## 2024-06-24 LAB — COMPREHENSIVE METABOLIC PANEL WITH GFR
ALT: 23 U/L (ref 0–44)
AST: 30 U/L (ref 15–41)
Albumin: 3.7 g/dL (ref 3.5–5.0)
Alkaline Phosphatase: 74 U/L (ref 38–126)
Anion gap: 9 (ref 5–15)
BUN: 13 mg/dL (ref 8–23)
CO2: 26 mmol/L (ref 22–32)
Calcium: 8.9 mg/dL (ref 8.9–10.3)
Chloride: 103 mmol/L (ref 98–111)
Creatinine, Ser: 0.94 mg/dL (ref 0.44–1.00)
GFR, Estimated: >60 mL/min (ref >60)
Glucose, Bld: 89 mg/dL (ref 70–99)
Potassium: 4 mmol/L (ref 3.5–5.1)
Sodium: 138 mmol/L (ref 135–145)
Total Bilirubin: 0.5 mg/dL (ref 0.0–1.2)
Total Protein: 6.9 g/dL (ref 6.5–8.1)

## 2024-06-24 LAB — SURGICAL PCR SCREEN
MRSA, PCR: NEGATIVE
Staphylococcus aureus: NEGATIVE

## 2024-06-24 LAB — TYPE AND SCREEN
ABO/RH(D): A POS
Antibody Screen: NEGATIVE

## 2024-06-24 LAB — APTT: aPTT: 29 s (ref 24–36)

## 2024-06-24 LAB — PROTIME-INR
INR: 1 (ref 0.8–1.2)
Prothrombin Time: 14 s (ref 11.4–15.2)

## 2024-06-24 NOTE — Progress Notes (Signed)
 Anesthesia Chart Review:  Case: [id number] Date/Time: 06/25/24 1245   Procedures:      VIDEO ASSISTED THORACOSCOPY (Left)     PLEURODESIS (Left)   Anesthesia type: General   Diagnosis: Chronic pleural effusion [J90]   Pre-op diagnosis: Chronic left pleural effusion   [facility] ROOM 14 / [facility]   Surgeons: Shyrl Linnie KIDD, MD       DISCUSSION: Patient is a 78 year old female scheduled for the above procedure.   History includes never smoker, post-operative N/V, DIFFICULT INTUBATION, HTN, CAD (s/p CABG x3 & LAD endarterectomy: LIMA-LAD, SVG-OM, SVG-DIAG 02/01/2024), MVP (normal MV structure, trivial MR 01/18/2024 TTE), skin cancer (BCC; SCC excision 2023, MOHS 12/2023), anxiety, spinal surgery (C5-7 ACDF 05/26/2011; C1-2 posterior fusion 02/16/2017; L45- PSF 12/07/2022), first rib resection (with right ulnar nerve decompression), TMJ arthroplasty (~ 30 years ago; reportedly had Teflon discs removed after ~ 5 years due to reaction of headaches/nausea).  Primary care notes indicate she also has a history of Polio at age 43 with ~ 2 weeks of LE paralysis.    Known potential Difficult Intubation due to limited mouth opening due to limited neck mobility (multiple c-spine surgeries) and mouth opening (related to TMJ/surgery) with prominent teeth.   -05/26/2011: A McGrath video laryngoscopy was use to placed ETT for ACDF at [facility].  - 02/16/2017: 3 and Glidescope used to place 7.0 mm ETT, mask ventilation without difficulty.  - 12/07/2022: IV induction, mask ventilation without difficulty, Glidescope and 3, rigid stylet, Grade 1 view, 7.0 ETT, 1 attempt. Difficulty anticipated due to limited oral opening, reduced neck mobility. - 02/01/2024:  IV induction, mask ventilation without difficulty, Glidescope and 3, stylet and oral airway, Grade 2 view, 8.0 ETT, 1 attempt. Difficulty anticipated.   She is s/p CABG x3 and LAD endarterectomy on 02/01/2024 by Dr. Linnie Shyrl. Plavix  was started post-operatively due to LAD endarterectomy. She had post-operative afib treated with IV amiodarone  and started on oral diltiazem . Limited TTE was done on 02/06/2024 that showed newly decreased LVEF to 40-50%, hypokinesis of the mid apical anterior wall and apical segment, grade 2 diastolic dysfunction, mild MR, and a small pericardial effusion. The mild drop in EF and wall motion abnormality was not unexpected due to complex LAD disease with an un-graftable segment in the distal LAD. She required left thoracentesis on 03/15/2024 for large left pleural effusion with recurrence and s/p repeat left thoracentesis on 03/21/2024 and 05/22/2024.  She has also been treated with a course of steroids. At 06/14/2024 follow-up with Dr. Shyrl CXR showed persistent large left pleural effusion. She had symptoms with exertion and positions. O2 sat 97%. He discussed management options including PleureX versus left VATS with pleurodesis.  She wished to consider her options, and ultimately opted for VATS.     Last cardiology follow-up with Dr. Wonda was on 06/04/2024. She denied chest pain, palpitations. No known afib recurrence. No bleeding. Some dyspnea and noted on-going follow-up with CT surgery regarding recurrent large left pleural effusion. No edema. Amiodarone  discontinued. Plan to continue ASA and Plavix  for 12 months post CABG. Update echo (scheduled for 07/11/2024) prior to her 3 month follow-up.   At 06/24/2024 PAT visit, she reported continued DOE, some worsening over the past 10 days, but no apparent acute respiratory distress. RR 18, O2 sat 97%. CXR report is still in process, but appears to show persistent large left pleural effusion. At PAT advised that if she developed if acute worsening of dyspnea  then she should seek emergency evaluation between now and surgery, otherwise plan to proceed as scheduled baring no acute changes.    Last Plavix  06/19/2024. Last ASA  06/24/2024.  Anesthesia team to evaluate on the day of surgery.   VS: BP (!) 157/79   Pulse (!) 56   Temp 36.8 C   Resp 18   Ht 5' 6 (1.676 m)   Wt 71.8 kg   SpO2 97%   BMI 25.57 kg/m   PROVIDERS: Street, Lonni HERO, MD is PCP [facility], phone [phone], fax [phone])  Wonda Sharper, MD is cardiologist   LABS: Labs reviewed: Acceptable for surgery. (all labs ordered are listed, but only abnormal results are displayed)  Labs Reviewed  CBC - Abnormal; Notable for the following components:      Result Value   MCH 25.5 (*)    RDW 17.9 (*)    All other components within normal limits  URINALYSIS, ROUTINE W REFLEX MICROSCOPIC - Abnormal; Notable for the following components:   Hgb urine dipstick SMALL (*)    Protein, ur 100 (*)    All other components within normal limits  URINALYSIS, MICROSCOPIC (REFLEX) - Abnormal; Notable for the following components:   Bacteria, UA RARE (*)    All other components within normal limits  SURGICAL PCR SCREEN  COMPREHENSIVE METABOLIC PANEL WITH GFR  PROTIME-INR  APTT  TYPE AND SCREEN     IMAGES: CXR 06/24/2024: In process.  CXR 06/14/2024: IMPRESSION: 1. Large left pleural effusion with left lower lobe atelectasis or airspace disease, similar to prior study. 2. Cardiomegaly.   EKG: 06/24/2024: Sinus bradycardia at 49 bpm Possible Left atrial enlargement Possible  Anteroseptal infarct , age undetermined T wave abnormality, consider inferolateral ischemia  Abnormal ECG    CV: Echo (Limited) 02/06/2024: IMPRESSIONS   1. There is no left ventricular thrombus (Definity  contrast was used). Left ventricular ejection fraction, by estimation, is 45 to 50%. The left ventricle has mildly decreased function. The left ventricle demonstrates  regional wall motion abnormalities (see scoring diagram/findings for description). Left ventricular diastolic parameters are consistent with Grade II diastolic  dysfunction  (pseudonormalization). There is moderate hypokinesis of the left ventricular, mid-apical anterior wall and apical segment.   2. Right ventricular systolic function is normal. The right ventricular size is normal. The estimated right ventricular systolic pressure is 27.0 mmHg.   3. A small pericardial effusion is present. The pericardial effusion is localized near the right ventricle. There is no evidence of cardiac tamponade. Large pleural effusion in the left lateral region.   4. The mitral valve is normal in structure. Mild mitral valve regurgitation. No evidence of mitral stenosis.   5. The aortic valve is tricuspid. There is mild calcification of the aortic valve. Aortic valve regurgitation is trivial. Aortic valve  sclerosis/calcification is present, without any evidence of aortic stenosis.   6. The inferior vena cava is normal in size with greater than 50% respiratory variability, suggesting right atrial pressure of 3 mmHg.  - Comparison(s): Prior images reviewed side by side. The left ventricular function is worsened. The left ventricular wall motion abnormality is new. It is compatible with either LAD artery distribution ischemia/infarction or with takotsubo syndrome (stress cardiomyopathy).  - Comparison Intra-op CABG TTE 02/01/2024: LVEF > 65%, no RWMA, normal RV systolic function, no ASD/PFO detected, trivial MR, trivial-mild TR.   US  Carotid 01/31/2024: Summary:  - Right Carotid: The extracranial vessels were near-normal with only minimal  wall thickening or plaque.  -  Left Carotid: The extracranial vessels were near-normal with only minimal  wall thickening or plaque.  - Vertebrals:  Bilateral vertebral arteries demonstrate antegrade flow.  - Subclavians: Normal flow hemodynamics were seen in bilateral subclavian arteries.     Cardiac cath (PRE-CABG) 01/15/2024: 1.  Patent left main with no significant stenosis 2.  Severe diffuse LAD stenosis with multiple lesions in the  proximal, mid, and apical portions.  The LAD wraps around the apex and supplies much of the inferior wall. 3.  Severe mid circumflex and first obtuse marginal stenoses 4.  Moderate to severe proximal and mid RCA stenoses 5.  Vigorous LV systolic function with LVEF greater than 65% and no regional wall motion abnormalities, moderately elevated LVEDP   Reviewed findings with patient.  Recommend outpatient cardiac surgical evaluation for consideration of CABG.  Difficult anatomy with diffuse disease including distal vessel disease.  RCA supplies small territory of myocardium due to large wraparound LAD.  I think her treatment options are likely limited to CABG versus aggressive medical therapy as she has very diffuse disease involvement for PCI. - S/p CABG x3: LIMA-LAD, SVT-OM, SVT-DIAG 02/01/2024.   Past Medical History:  Diagnosis Date   Anxiety    Arthritis    Cancer (HCC)    basal and squamous cell carcinoma. Lesion excisions in 2023.   Complication of anesthesia    Limited mouth opening and neck mobility, s/p TMJ surgeries    Coronary artery disease    Difficult intubation    Fibromyalgia    Hypertension    MVP (mitral valve prolapse)    Pneumonia 2023   Hospitalized for 2 nights, [place].   Polio    as a child   PONV (postoperative nausea and vomiting)    Tachycardia    15 years ago my family doctor put me on metoprolol  - 06/24/24    Past Surgical History:  Procedure Laterality Date   ABDOMINAL HYSTERECTOMY     APPENDECTOMY     CATARACT EXTRACTION Bilateral    CERVICAL SPINE SURGERY     CORONARY ARTERY BYPASS GRAFT N/A 02/01/2024   Procedure: CORONARY ARTERY BYPASS GRAFTING X 3, USING LEFT INTERNAL MAMMARY ARTERY AND ENDOSCOPIC HARVESTED RIGHT SAPHENOUS VEIN, ENDARTERECTOMY LEFT ANTERIOR DESCENDING CORONARY ARTERY;  Surgeon: Shyrl Linnie KIDD, MD;  Location: [facility] OR;  Service: Open Heart Surgery;  Laterality: N/A;   DILATION AND CURETTAGE OF UTERUS     x 2   FIRST  RIB REMOVAL Right    HAND SURGERY Bilateral    IR THORACENTESIS ASP PLEURAL SPACE W/IMG GUIDE  03/15/2024   IR THORACENTESIS ASP PLEURAL SPACE W/IMG GUIDE  03/21/2024   IR THORACENTESIS ASP PLEURAL SPACE W/IMG GUIDE  05/22/2024   KNEE ARTHROSCOPY W/ MENISCAL REPAIR Right    KNEE ARTHROSCOPY W/ MENISCAL REPAIR Left    LEFT HEART CATH AND CORONARY ANGIOGRAPHY N/A 01/15/2024   Procedure: LEFT HEART CATH AND CORONARY ANGIOGRAPHY;  Surgeon: Wonda Sharper, MD;  Location: [facility] INVASIVE CV LAB;  Service: Cardiovascular;  Laterality: N/A;   NEUROMA SURGERY Left    POSTERIOR CERVICAL FUSION/FORAMINOTOMY N/A 02/16/2017   Procedure: CERVICAL 1- CERVICAL 2 POSTERIOR INSTRUMENTATION AND FUSION;  Surgeon: Mavis Purchase, MD;  Location: [facility] OR;  Service: Neurosurgery;  Laterality: N/A;  CERVICAL 1- CERVICAL 2 POSTERIOR INSTRUMENTATION AND FUSION   ROTATOR CUFF REPAIR Right    SPINE SURGERY     I had the tip of my tailbone removed   SQUAMOUS CELL CARCINOMA EXCISION  2023   2  on right leg and 1 on nose.   TEE WITHOUT CARDIOVERSION N/A 02/01/2024   Procedure: ECHOCARDIOGRAM, TRANSESOPHAGEAL;  Surgeon: Shyrl Linnie KIDD, MD;  Location: [facility];  Service: Open Heart Surgery;  Laterality: N/A;   TMJ ARTHROPLASTY      MEDICATIONS:  ALPRAZolam  (XANAX ) 0.5 MG tablet   aspirin  EC 81 MG tablet   clopidogrel  (PLAVIX ) 75 MG tablet   escitalopram  (LEXAPRO ) 10 MG tablet   metoprolol  succinate (TOPROL -XL) 50 MG 24 hr tablet   rosuvastatin  (CRESTOR ) 40 MG tablet   telmisartan  (MICARDIS ) 80 MG tablet   traMADol  (ULTRAM ) 50 MG tablet   No current facility-administered medications for this encounter.    Isaiah Ruder, PA-C Surgical Short Stay/Anesthesiology [facility] Phone [phone] [facility] Phone [phone] 06/24/2024 11:25 AM

## 2024-06-24 NOTE — Progress Notes (Signed)
 PCP - Street, Lonni HERO, MD Cardiologist - Wonda Sharper, MD   PPM/ICD - denies   Chest x-ray - 06/24/2024  EKG - 06/24/2024  Stress Test - over 20 years ago per patient report ECHO - 02/06/24- pt has ECHO scheduled for later in December Cardiac Cath - 01/15/24  Sleep Study - denies   Fasting Blood Sugar - N/A  Last dose of GLP1 agonist-   N/A   Blood Thinner Instructions: Plavix - last dose 06/19/24 in AM Aspirin  Instructions: Last dose 06/24/2024   ERAS Protcol - NPO order  COVID TEST-  N/A   Anesthesia review: yes- history difficult intubation, pt short of breath with minimal exertion- reports that she cannot walk more than 20 feet without getting short of breath. Pt reports this has worsened in the last 10 days. Patient does not wear oxygen. O2 sat 97% today on room air. Pt in no apparent distress. Pt denies cardiac symptoms. Pt educated to seek medical attention if she has shortness of breath that does not resolve/symptoms become emergent. Pt verbalized understanding. CXR done at PAT.   Patient denies shortness of breath, fever, cough and chest pain at PAT appointment   All instructions explained to the patient, with a verbal understanding of the material. Patient agrees to go over the instructions while at home for a better understanding.  The opportunity to ask questions was provided.

## 2024-06-24 NOTE — Progress Notes (Signed)
 Pt made aware of surgery time change for Tuesday 06/25/24 1300-1506, arrival 1100, and to all other previous instructions given.

## 2024-06-24 NOTE — Anesthesia Preprocedure Evaluation (Signed)
 Anesthesia Evaluation  Patient identified by MRN, date of birth, ID band Patient awake    Reviewed: Allergy & Precautions, NPO status , Patient's Chart, lab work & pertinent test results  History of Anesthesia Complications (+) PONV, DIFFICULT AIRWAY and history of anesthetic complications  Airway Mallampati: III  TM Distance: <3 FB Neck ROM: Limited  Mouth opening: Limited Mouth Opening  Dental  (+) Dental Advisory Given, Caps,    Pulmonary  Chronic left pleural effusion   Pulmonary exam normal breath sounds clear to auscultation       Cardiovascular hypertension, + CAD and + CABG (02/01/24)  Normal cardiovascular exam+ Valvular Problems/Murmurs MVP  Rhythm:Regular Rate:Normal     Neuro/Psych  PSYCHIATRIC DISORDERS Anxiety     S/p C1-2 posterior CDF    GI/Hepatic negative GI ROS, Neg liver ROS,,,  Endo/Other  negative endocrine ROS    Renal/GU negative Renal ROS     Musculoskeletal  (+) Arthritis ,  Fibromyalgia -  Abdominal   Peds  Hematology negative hematology ROS (+)   Anesthesia Other Findings Day of surgery medications reviewed with the patient.  S/p TMJ arthroplasty   Reproductive/Obstetrics                              Anesthesia Physical Anesthesia Plan  ASA: 3  Anesthesia Plan: General   Post-op Pain Management: Tylenol  PO (pre-op)*   Induction: Intravenous  PONV Risk Score and Plan: 4 or greater and Dexamethasone  and Ondansetron   Airway Management Planned: Double Lumen EBT and Video Laryngoscope Planned  Additional Equipment: ClearSight  Intra-op Plan:   Post-operative Plan: Extubation in OR  Informed Consent: I have reviewed the patients History and Physical, chart, labs and discussed the procedure including the risks, benefits and alternatives for the proposed anesthesia with the patient or authorized representative who has indicated his/her understanding  and acceptance.     Dental advisory given  Plan Discussed with: CRNA  Anesthesia Plan Comments: (PAT note written 06/24/2024 by Neno Hohensee, PA-C.  )         Anesthesia Quick Evaluation

## 2024-06-25 ENCOUNTER — Other Ambulatory Visit: Payer: Self-pay

## 2024-06-25 ENCOUNTER — Encounter (HOSPITAL_COMMUNITY)
Admission: RE | Disposition: A | Payer: Self-pay | Source: Home / Self Care | Attending: Thoracic Surgery (Cardiothoracic Vascular Surgery)

## 2024-06-25 ENCOUNTER — Inpatient Hospital Stay (HOSPITAL_COMMUNITY)
Admission: RE | Admit: 2024-06-25 | Discharge: 2024-06-28 | DRG: 165 | Disposition: A | Attending: Thoracic Surgery (Cardiothoracic Vascular Surgery) | Admitting: Thoracic Surgery (Cardiothoracic Vascular Surgery)

## 2024-06-25 ENCOUNTER — Encounter (HOSPITAL_COMMUNITY): Payer: Self-pay | Admitting: Thoracic Surgery (Cardiothoracic Vascular Surgery)

## 2024-06-25 ENCOUNTER — Inpatient Hospital Stay (HOSPITAL_COMMUNITY): Payer: Self-pay | Admitting: Vascular Surgery

## 2024-06-25 ENCOUNTER — Inpatient Hospital Stay (HOSPITAL_COMMUNITY): Payer: Self-pay | Admitting: Anesthesiology

## 2024-06-25 ENCOUNTER — Inpatient Hospital Stay (HOSPITAL_COMMUNITY)

## 2024-06-25 DIAGNOSIS — J90 Pleural effusion, not elsewhere classified: Principal | ICD-10-CM | POA: Diagnosis present

## 2024-06-25 DIAGNOSIS — I2510 Atherosclerotic heart disease of native coronary artery without angina pectoris: Secondary | ICD-10-CM

## 2024-06-25 DIAGNOSIS — R918 Other nonspecific abnormal finding of lung field: Secondary | ICD-10-CM | POA: Diagnosis not present

## 2024-06-25 DIAGNOSIS — F419 Anxiety disorder, unspecified: Secondary | ICD-10-CM | POA: Diagnosis not present

## 2024-06-25 DIAGNOSIS — Z981 Arthrodesis status: Secondary | ICD-10-CM | POA: Diagnosis not present

## 2024-06-25 DIAGNOSIS — I10 Essential (primary) hypertension: Secondary | ICD-10-CM | POA: Diagnosis not present

## 2024-06-25 DIAGNOSIS — I517 Cardiomegaly: Secondary | ICD-10-CM | POA: Diagnosis not present

## 2024-06-25 DIAGNOSIS — Z4789 Encounter for other orthopedic aftercare: Secondary | ICD-10-CM | POA: Diagnosis not present

## 2024-06-25 DIAGNOSIS — J939 Pneumothorax, unspecified: Secondary | ICD-10-CM | POA: Diagnosis not present

## 2024-06-25 DIAGNOSIS — T797XXA Traumatic subcutaneous emphysema, initial encounter: Secondary | ICD-10-CM | POA: Diagnosis not present

## 2024-06-25 DIAGNOSIS — Z4682 Encounter for fitting and adjustment of non-vascular catheter: Secondary | ICD-10-CM | POA: Diagnosis not present

## 2024-06-25 DIAGNOSIS — E785 Hyperlipidemia, unspecified: Secondary | ICD-10-CM | POA: Diagnosis present

## 2024-06-25 DIAGNOSIS — M797 Fibromyalgia: Secondary | ICD-10-CM | POA: Diagnosis present

## 2024-06-25 DIAGNOSIS — Z9079 Acquired absence of other genital organ(s): Secondary | ICD-10-CM | POA: Diagnosis not present

## 2024-06-25 DIAGNOSIS — Z951 Presence of aortocoronary bypass graft: Secondary | ICD-10-CM | POA: Diagnosis not present

## 2024-06-25 DIAGNOSIS — T8182XA Emphysema (subcutaneous) resulting from a procedure, initial encounter: Secondary | ICD-10-CM | POA: Diagnosis not present

## 2024-06-25 SURGERY — VIDEO ASSISTED THORACOSCOPY
Anesthesia: General | Site: Chest | Laterality: Left

## 2024-06-25 MED ORDER — ALBUMIN HUMAN 5 % IV SOLN: INTRAVENOUS | Status: DC | PRN

## 2024-06-25 MED ORDER — ORAL CARE MOUTH RINSE: 15.0000 mL | Freq: Once | OROMUCOSAL | Status: AC

## 2024-06-25 MED ORDER — BISACODYL 5 MG PO TBEC
10.0000 mg | DELAYED_RELEASE_TABLET | Freq: Every day | ORAL | Status: DC
  Filled 2024-06-25 (×4): qty 2

## 2024-06-25 MED ORDER — ENOXAPARIN SODIUM 40 MG/0.4ML IJ SOSY
40.0000 mg | PREFILLED_SYRINGE | Freq: Every day | INTRAMUSCULAR | Status: DC
  Administered 2024-06-26 – 2024-06-28 (×3): 40 mg via SUBCUTANEOUS
  Filled 2024-06-25 (×3): qty 0.4

## 2024-06-25 MED ORDER — CEFAZOLIN SODIUM-DEXTROSE 2-4 GM/100ML-% IV SOLN
2.0000 g | INTRAVENOUS | Status: AC
  Administered 2024-06-25: 2 g via INTRAVENOUS
  Filled 2024-06-25: qty 100

## 2024-06-25 MED ORDER — PHENYLEPHRINE 80 MCG/ML (10ML) SYRINGE FOR IV PUSH (FOR BLOOD PRESSURE SUPPORT)
PREFILLED_SYRINGE | INTRAVENOUS | Status: AC
  Filled 2024-06-25: qty 30

## 2024-06-25 MED ORDER — FENTANYL CITRATE (PF) 250 MCG/5ML IJ SOLN
INTRAMUSCULAR | Status: AC
  Filled 2024-06-25: qty 5

## 2024-06-25 MED ORDER — LIDOCAINE 2% (20 MG/ML) 5 ML SYRINGE
INTRAMUSCULAR | Status: DC | PRN
  Administered 2024-06-25: 80 mg via INTRAVENOUS

## 2024-06-25 MED ORDER — ROCURONIUM BROMIDE 10 MG/ML (PF) SYRINGE
PREFILLED_SYRINGE | INTRAVENOUS | Status: AC
  Filled 2024-06-25: qty 30

## 2024-06-25 MED ORDER — BUPIVACAINE LIPOSOME 1.3 % IJ SUSP
INTRAMUSCULAR | Status: AC
  Filled 2024-06-25: qty 20

## 2024-06-25 MED ORDER — METOPROLOL SUCCINATE ER 50 MG PO TB24
50.0000 mg | ORAL_TABLET | Freq: Every day | ORAL | Status: DC
  Administered 2024-06-26: 50 mg via ORAL
  Filled 2024-06-25: qty 1

## 2024-06-25 MED ORDER — SENNOSIDES-DOCUSATE SODIUM 8.6-50 MG PO TABS
1.0000 | ORAL_TABLET | Freq: Every day | ORAL | Status: DC
  Filled 2024-06-25: qty 1

## 2024-06-25 MED ORDER — PANTOPRAZOLE SODIUM 40 MG PO TBEC
40.0000 mg | DELAYED_RELEASE_TABLET | Freq: Every day | ORAL | Status: DC
  Administered 2024-06-26 – 2024-06-28 (×3): 40 mg via ORAL
  Filled 2024-06-25 (×3): qty 1

## 2024-06-25 MED ORDER — CLOPIDOGREL BISULFATE 75 MG PO TABS
75.0000 mg | ORAL_TABLET | Freq: Every day | ORAL | Status: DC
  Administered 2024-06-26 – 2024-06-28 (×3): 75 mg via ORAL
  Filled 2024-06-25 (×3): qty 1

## 2024-06-25 MED ORDER — ACETAMINOPHEN 500 MG PO TABS
1000.0000 mg | ORAL_TABLET | Freq: Once | ORAL | Status: AC
  Administered 2024-06-25: 1000 mg via ORAL
  Filled 2024-06-25: qty 2

## 2024-06-25 MED ORDER — ROCURONIUM BROMIDE 10 MG/ML (PF) SYRINGE
PREFILLED_SYRINGE | INTRAVENOUS | Status: DC | PRN
  Administered 2024-06-25: 30 mg via INTRAVENOUS

## 2024-06-25 MED ORDER — FENTANYL CITRATE (PF) 100 MCG/2ML IJ SOLN
INTRAMUSCULAR | Status: AC
  Filled 2024-06-25: qty 2

## 2024-06-25 MED ORDER — HEPARIN SODIUM (PORCINE) 1000 UNIT/ML IJ SOLN
INTRAMUSCULAR | Status: AC
  Filled 2024-06-25: qty 1

## 2024-06-25 MED ORDER — ACETAMINOPHEN 500 MG PO TABS
1000.0000 mg | ORAL_TABLET | Freq: Four times a day (QID) | ORAL | Status: DC
  Administered 2024-06-25 – 2024-06-27 (×9): 1000 mg via ORAL
  Filled 2024-06-25 (×10): qty 2

## 2024-06-25 MED ORDER — TRAMADOL HCL 50 MG PO TABS
50.0000 mg | ORAL_TABLET | Freq: Four times a day (QID) | ORAL | Status: DC | PRN
  Administered 2024-06-28 (×2): 50 mg via ORAL
  Filled 2024-06-25 (×2): qty 1

## 2024-06-25 MED ORDER — EPHEDRINE SULFATE (PRESSORS) 25 MG/5ML IV SOSY
PREFILLED_SYRINGE | INTRAVENOUS | Status: DC | PRN
  Administered 2024-06-25: 10 mg via INTRAVENOUS

## 2024-06-25 MED ORDER — SUCCINYLCHOLINE CHLORIDE 200 MG/10ML IV SOSY
PREFILLED_SYRINGE | INTRAVENOUS | Status: AC
  Filled 2024-06-25: qty 10

## 2024-06-25 MED ORDER — FENTANYL CITRATE (PF) 250 MCG/5ML IJ SOLN
INTRAMUSCULAR | Status: DC | PRN
  Administered 2024-06-25: 50 ug via INTRAVENOUS

## 2024-06-25 MED ORDER — PROPOFOL 10 MG/ML IV BOLUS
INTRAVENOUS | Status: DC | PRN
  Administered 2024-06-25: 80 mg via INTRAVENOUS

## 2024-06-25 MED ORDER — PHENYLEPHRINE HCL-NACL 20-0.9 MG/250ML-% IV SOLN
INTRAVENOUS | Status: DC | PRN
  Administered 2024-06-25: 25 ug/min via INTRAVENOUS

## 2024-06-25 MED ORDER — VASOPRESSIN 20 UNIT/ML IV SOLN
INTRAVENOUS | Status: AC
  Filled 2024-06-25: qty 1

## 2024-06-25 MED ORDER — ESCITALOPRAM OXALATE 10 MG PO TABS
10.0000 mg | ORAL_TABLET | Freq: Every day | ORAL | Status: DC
  Administered 2024-06-26 – 2024-06-28 (×3): 10 mg via ORAL
  Filled 2024-06-25 (×3): qty 1

## 2024-06-25 MED ORDER — BUPIVACAINE LIPOSOME 1.3 % IJ SUSP: INTRAMUSCULAR | Status: DC | PRN

## 2024-06-25 MED ORDER — PROTAMINE SULFATE 10 MG/ML IV SOLN
INTRAVENOUS | Status: AC
  Filled 2024-06-25: qty 25

## 2024-06-25 MED ORDER — 0.9 % SODIUM CHLORIDE (POUR BTL) OPTIME
TOPICAL | Status: DC | PRN
  Administered 2024-06-25: 2000 mL

## 2024-06-25 MED ORDER — ASPIRIN 81 MG PO TBEC
81.0000 mg | DELAYED_RELEASE_TABLET | Freq: Every day | ORAL | Status: DC
  Administered 2024-06-26 – 2024-06-28 (×3): 81 mg via ORAL
  Filled 2024-06-25 (×3): qty 1

## 2024-06-25 MED ORDER — EPHEDRINE 5 MG/ML INJ
INTRAVENOUS | Status: AC
  Filled 2024-06-25: qty 10

## 2024-06-25 MED ORDER — SUGAMMADEX SODIUM 200 MG/2ML IV SOLN
INTRAVENOUS | Status: DC | PRN
  Administered 2024-06-25: 150 mg via INTRAVENOUS

## 2024-06-25 MED ORDER — SUCCINYLCHOLINE CHLORIDE 200 MG/10ML IV SOSY
PREFILLED_SYRINGE | INTRAVENOUS | Status: DC | PRN
  Administered 2024-06-25: 120 mg via INTRAVENOUS

## 2024-06-25 MED ORDER — CHLORHEXIDINE GLUCONATE 0.12 % MT SOLN
15.0000 mL | Freq: Once | OROMUCOSAL | Status: AC
  Administered 2024-06-25: 15 mL via OROMUCOSAL
  Filled 2024-06-25: qty 15

## 2024-06-25 MED ORDER — ACETAMINOPHEN 160 MG/5ML PO SOLN: 1000.0000 mg | Freq: Four times a day (QID) | ORAL | Status: DC

## 2024-06-25 MED ORDER — OXYCODONE HCL 5 MG PO TABS
5.0000 mg | ORAL_TABLET | ORAL | Status: DC | PRN
  Filled 2024-06-25: qty 2

## 2024-06-25 MED ORDER — BUPIVACAINE HCL (PF) 0.5 % IJ SOLN
INTRAMUSCULAR | Status: AC
  Filled 2024-06-25: qty 30

## 2024-06-25 MED ORDER — ONDANSETRON HCL 4 MG/2ML IJ SOLN: 4.0000 mg | Freq: Four times a day (QID) | INTRAMUSCULAR | Status: DC | PRN

## 2024-06-25 MED ORDER — VASOPRESSIN 20 UNIT/ML IV SOLN
INTRAVENOUS | Status: DC | PRN
  Administered 2024-06-25 (×2): 1 [IU] via INTRAVENOUS

## 2024-06-25 MED ORDER — FENTANYL CITRATE (PF) 100 MCG/2ML IJ SOLN
25.0000 ug | INTRAMUSCULAR | Status: DC | PRN
  Administered 2024-06-25: 25 ug via INTRAVENOUS
  Administered 2024-06-25: 50 ug via INTRAVENOUS

## 2024-06-25 MED ORDER — MORPHINE SULFATE (PF) 2 MG/ML IV SOLN: 2.0000 mg | INTRAVENOUS | Status: DC | PRN

## 2024-06-25 MED ORDER — ONDANSETRON HCL 4 MG/2ML IJ SOLN
INTRAMUSCULAR | Status: AC
  Filled 2024-06-25: qty 4

## 2024-06-25 MED ORDER — DEXAMETHASONE SOD PHOSPHATE PF 10 MG/ML IJ SOLN
INTRAMUSCULAR | Status: DC | PRN
  Administered 2024-06-25: 5 mg via INTRAVENOUS

## 2024-06-25 MED ORDER — LACTATED RINGERS IV SOLN: INTRAVENOUS | Status: DC

## 2024-06-25 MED ORDER — LIDOCAINE 2% (20 MG/ML) 5 ML SYRINGE
INTRAMUSCULAR | Status: AC
  Filled 2024-06-25: qty 10

## 2024-06-25 MED ORDER — ONDANSETRON HCL 4 MG/2ML IJ SOLN
INTRAMUSCULAR | Status: DC | PRN
  Administered 2024-06-25: 4 mg via INTRAVENOUS

## 2024-06-25 MED ORDER — ALPRAZOLAM 0.5 MG PO TABS
0.5000 mg | ORAL_TABLET | Freq: Three times a day (TID) | ORAL | Status: DC | PRN
  Administered 2024-06-25 – 2024-06-27 (×3): 0.5 mg via ORAL
  Filled 2024-06-25 (×3): qty 1

## 2024-06-25 MED ORDER — ROSUVASTATIN CALCIUM 20 MG PO TABS
40.0000 mg | ORAL_TABLET | Freq: Every day | ORAL | Status: DC
  Administered 2024-06-26 – 2024-06-28 (×3): 40 mg via ORAL
  Filled 2024-06-25 (×3): qty 2

## 2024-06-25 MED ORDER — CEFAZOLIN SODIUM-DEXTROSE 2-4 GM/100ML-% IV SOLN
2.0000 g | Freq: Three times a day (TID) | INTRAVENOUS | Status: AC
  Administered 2024-06-25 – 2024-06-26 (×2): 2 g via INTRAVENOUS
  Filled 2024-06-25 (×2): qty 100

## 2024-06-25 MED ORDER — KETOROLAC TROMETHAMINE 15 MG/ML IJ SOLN: 15.0000 mg | Freq: Four times a day (QID) | INTRAMUSCULAR | Status: AC | PRN

## 2024-06-25 MED ORDER — ONDANSETRON HCL 4 MG/2ML IJ SOLN: 4.0000 mg | Freq: Once | INTRAMUSCULAR | Status: DC | PRN

## 2024-06-25 SURGICAL SUPPLY — 82 items
BLADE CLIPPER SURG (BLADE) ×2 IMPLANT
CANISTER SUCTION 3000ML PPV (SUCTIONS) ×2 IMPLANT
CATH THORACIC 28FR (CATHETERS) IMPLANT
CATH THORACIC 28FR RT ANG (CATHETERS) IMPLANT
CATH THORACIC 36FR (CATHETERS) IMPLANT
CATH THORACIC 36FR RT ANG (CATHETERS) IMPLANT
CATH TROCAR 20FR (CATHETERS) IMPLANT
CHLORAPREP W/TINT 26 (MISCELLANEOUS) ×2 IMPLANT
CLIP APPLIE ROT 10 11.4 M/L (STAPLE) IMPLANT
CLIP TI MEDIUM 6 (CLIP) ×2 IMPLANT
CNTNR URN SCR LID CUP LEK RST (MISCELLANEOUS) ×4 IMPLANT
CONN ST 1/4X3/8 BEN (MISCELLANEOUS) IMPLANT
CONN Y 3/8X3/8X3/8 BEN (MISCELLANEOUS) IMPLANT
COVER SURGICAL LIGHT HANDLE (MISCELLANEOUS) IMPLANT
DEFOGGER SCOPE WARM SEASHARP (MISCELLANEOUS) ×2 IMPLANT
DERMABOND ADVANCED .7 DNX12 (GAUZE/BANDAGES/DRESSINGS) IMPLANT
DISSECTOR BLUNT TIP ENDO 5MM (MISCELLANEOUS) IMPLANT
DRAIN CHANNEL 19F RND (DRAIN) IMPLANT
DRAIN CHANNEL 28F RND 3/8 FF (WOUND CARE) IMPLANT
DRAIN CONNECTOR BLAKE 1:1 (MISCELLANEOUS) IMPLANT
DRAPE CV SPLIT W-CLR ANES SCRN (DRAPES) IMPLANT
DRAPE IMP U-DRAPE 54X76 (DRAPES) IMPLANT
DRAPE WARM FLUID 44X44 (DRAPES) ×2 IMPLANT
ELECT BLADE 6.5 EXT (BLADE) ×2 IMPLANT
ELECTRODE BLDE 4.0 EZ CLN MEGD (MISCELLANEOUS) IMPLANT
ELECTRODE REM PT RTRN 9FT ADLT (ELECTROSURGICAL) ×2 IMPLANT
GAUZE 4X4 16PLY ~~LOC~~+RFID DBL (SPONGE) ×2 IMPLANT
GAUZE SPONGE 4X4 12PLY STRL (GAUZE/BANDAGES/DRESSINGS) ×2 IMPLANT
GOWN STRL REUS W/ TWL LRG LVL3 (GOWN DISPOSABLE) ×4 IMPLANT
GOWN STRL REUS W/ TWL XL LVL3 (GOWN DISPOSABLE) ×2 IMPLANT
GOWN STRL SURGICAL XL XLNG (GOWN DISPOSABLE) ×2 IMPLANT
HEMOSTAT SURGICEL 2X14 (HEMOSTASIS) IMPLANT
IRRIGATION SUCT STRKRFLW 2 WTP (MISCELLANEOUS) IMPLANT
KIT BASIN OR (CUSTOM PROCEDURE TRAY) ×2 IMPLANT
KIT SUCTION CATH 14FR (SUCTIONS) IMPLANT
KIT TURNOVER KIT B (KITS) ×2 IMPLANT
NDL 22X1.5 STRL (OR ONLY) (MISCELLANEOUS) ×2 IMPLANT
NDL SPNL 18GX3.5 QUINCKE PK (NEEDLE) IMPLANT
PACK CHEST (CUSTOM PROCEDURE TRAY) ×2 IMPLANT
PACK UNIVERSAL I (CUSTOM PROCEDURE TRAY) ×2 IMPLANT
PAD ARMBOARD POSITIONER FOAM (MISCELLANEOUS) ×4 IMPLANT
POUCH ENDO CATCH II 15MM (MISCELLANEOUS) IMPLANT
RETRACTOR WOUND ALXS 19CM XSML (INSTRUMENTS) IMPLANT
SCISSORS LAP 5X35 DISP (ENDOMECHANICALS) IMPLANT
SEALANT PROGEL (MISCELLANEOUS) IMPLANT
SEALANT SURG COSEAL 4ML (VASCULAR PRODUCTS) IMPLANT
SEALANT SURG COSEAL 8ML (VASCULAR PRODUCTS) IMPLANT
SEALER LIGASURE MARYLAND 30 (ELECTROSURGICAL) ×2 IMPLANT
SET IV EXT TUBING 30 (IV SETS) ×2 IMPLANT
SOLN 0.9% NACL POUR BTL 1000ML (IV SOLUTION) ×6 IMPLANT
SOLN STERILE WATER BTL 1000 ML (IV SOLUTION) ×2 IMPLANT
SPECIMEN JAR MEDIUM (MISCELLANEOUS) IMPLANT
SPONGE INTESTINAL PEANUT (DISPOSABLE) ×4 IMPLANT
SPONGE T-LAP 18X18 ~~LOC~~+RFID (SPONGE) ×8 IMPLANT
SPONGE TONSIL 1 RF SGL (DISPOSABLE) ×2 IMPLANT
STAPLER ENDO GIA 12 SHRT THIN (STAPLE) ×2 IMPLANT
STOPCOCK 4 WAY LG BORE MALE ST (IV SETS) ×2 IMPLANT
SUT MNCRL AB 3-0 PS2 18 (SUTURE) IMPLANT
SUT MON AB 2-0 CT1 36 (SUTURE) IMPLANT
SUT PDS AB 1 CTX 36 (SUTURE) IMPLANT
SUT PROLENE 4-0 RB1 .5 CRCL 36 (SUTURE) IMPLANT
SUT SILK 1 MH (SUTURE) IMPLANT
SUT SILK 1 TIES 10X30 (SUTURE) ×2 IMPLANT
SUT SILK 2 0 SH (SUTURE) IMPLANT
SUT SILK 2 0SH CR/8 30 (SUTURE) IMPLANT
SUT VIC AB 1 CTX36XBRD ANBCTR (SUTURE) IMPLANT
SUT VIC AB 2-0 CT1 TAPERPNT 27 (SUTURE) ×4 IMPLANT
SUT VIC AB 3-0 SH 27X BRD (SUTURE) ×2 IMPLANT
SUT VICRYL 0 UR6 27IN ABS (SUTURE) ×2 IMPLANT
SYR 10ML LL (SYRINGE) ×2 IMPLANT
SYR 20ML LL LF (SYRINGE) IMPLANT
SYR 30ML LL (SYRINGE) ×2 IMPLANT
SYR 50ML LL SCALE MARK (SYRINGE) ×2 IMPLANT
SYSTEM BAG RETRIEVAL 10MM (BASKET) IMPLANT
SYSTEM SAHARA CHEST DRAIN ATS (WOUND CARE) ×2 IMPLANT
TAPE CLOTH 4X10 WHT NS (GAUZE/BANDAGES/DRESSINGS) ×2 IMPLANT
TAPE CLOTH SURG 4X10 WHT LF (GAUZE/BANDAGES/DRESSINGS) IMPLANT
TOWEL GREEN STERILE (TOWEL DISPOSABLE) ×2 IMPLANT
TOWEL GREEN STERILE FF (TOWEL DISPOSABLE) ×2 IMPLANT
TRAY FOLEY MTR SLVR 16FR STAT (SET/KITS/TRAYS/PACK) ×2 IMPLANT
TROCAR XCEL BLADELESS 5X75MML (TROCAR) ×2 IMPLANT
TROCAR XCEL NON-BLD 5MMX100MML (ENDOMECHANICALS) IMPLANT

## 2024-06-25 NOTE — Transfer of Care (Signed)
 Immediate Anesthesia Transfer of Care Note  Patient: Christine Hart  Procedure(s) Performed: VIDEO ASSISTED THORACOSCOPY (Left) DECORTICATION (Left: Chest)  Patient Location: PACU  Anesthesia Type:General  Level of Consciousness: awake, alert , patient cooperative, and responds to stimulation  Airway & Oxygen Therapy: Patient Spontanous Breathing and Patient connected to face mask oxygen  Post-op Assessment: Report given to RN and Post -op Vital signs reviewed and stable  Post vital signs: Reviewed and stable  Last Vitals:  Vitals Value Taken Time  BP 158/88 06/25/24 14:20  Temp    Pulse 54 06/25/24 14:27  Resp 21 06/25/24 14:27  SpO2 89 % 06/25/24 14:27  Vitals shown include unfiled device data.  Last Pain:  Vitals:   06/25/24 1143  TempSrc:   PainSc: 0-No pain         Complications: No notable events documented.

## 2024-06-25 NOTE — Interval H&P Note (Signed)
 History and Physical Interval Note:  06/25/2024 12:31 PM  Christine Hart  has presented today for surgery, with the diagnosis of Chronic left pleural effusion.  The various methods of treatment have been discussed with the patient and family. After consideration of risks, benefits and other options for treatment, the patient has consented to  Procedure(s): VIDEO ASSISTED THORACOSCOPY (Left) PLEURODESIS (Left) as a surgical intervention.  The patient's history has been reviewed, patient examined, no change in status, stable for surgery.  I have reviewed the patient's chart and labs.  Questions were answered to the patient's satisfaction.     Verna Hamon MALVA Rayas

## 2024-06-25 NOTE — Op Note (Signed)
     [address] [place] [address]             [phone]    06/25/2024 Patient:  Christine Hart Pre-Op Dx: Recurrent Left pleural effusion     Hx of CABG Post-op Dx:  same Procedure: - Left Video assisted thoracoscopy - Decortication - Intercostal nerve block  Surgeon and Role:      * Tiffiany Beadles, Linnie KIDD, MD - Primary      Anesthesia  general EBL:  50ml Blood Administration: none Specimen: none  Drains: 19 F argyle chest tube in left chest Counts: correct    Indications: 78 y.o. female status post CABG earlier this summer, but has had a chronic left effusion.  We discussed several options for treatment of this including PleurX catheter drainage versus left VATS with pleurodesis.  She has agreed to proceed with a thoracoscopy.  Findings: The lower lobe was entrapped.  We achieved good re-expansion following decortication.    Operative Technique: After the risks, benefits and alternatives were thoroughly discussed, the patient was brought to the operative theatre.  Anesthesia was induced, the patient was then placed in a decubitus position and was prepped and draped in normal sterile fashion.  An appropriate surgical pause was performed, and pre-operative antibiotics were dosed accordingly.  We began with 2cm incision in the anterior axillary line at the 5th intercostal space.  The chest was entered, and we then placed a 1cm incision at the 10th intercostal space, and introduced our camera port.  The lung was directly visualized.  The lung was then mobilized off of the chest wall.  The pleural peal was carefully decorticated off of lower lobe.  The fissure was then mobilized.  We achieved good expansion of the lung.  The chest was then irrigated.    An intercostal nerve block was performed under direct visualization.  A 19 F chest tube was then placed, and we watch the lung re-expand.  The skin and soft tissue were closed with absorbable suture     The patient tolerated the procedure without any immediate complications, and was transferred to the PACU in stable condition.  Gerardo Territo KIDD Christine Hart

## 2024-06-25 NOTE — Hospital Course (Addendum)
 History of Present Illness:  Christine Hart is a 78 yo female with known history of Christine Hart is a 78 y.o. female with a past medical history of CAD, mitral valve prolapse, SVT, hypertension, hyperlipidemia, arthritis, chronic back pain, and anxiety.  She is also well known to [facility] as she underwent CABG x 3 performed by Dr. Shyrl on 02/01/2024.  Since her surgery she has continued to have issues with left pleural effusion.  She has undergo Thoracentesis on 3 separate occasions, with continued development of moderate left pleural effusion.  She continues to be symptomatic with exertion and with certain positions.  She was offered Video Assisted Thoracoscopy with drainage of effusion and pleurodesis and possible placement of Pleur-x catheter.  The risks and benefits of the procedure were explained to the patient and she was agreeable to proceed.   Hospital Course:  Christine Hart presented to [facility] on 06/25/2024.  She was taken to the operating room and underwent Left Video Assisted Thoracoscopy with drainage of left pleural effusion and decortication.  The patient tolerated the procedure without difficulty, was extubated, and taken to the PACU in stable condition.  The patient did well post operatively.  Her chest tube was managed on water seal without evidence of air leak.  Her chest xray showed improvement of pleural effusion.  There was some sub cutaneous emphysema present and possible sub pulmonic pneumothorax.  Her output slowed and her chest tube was able to be removed on POD3 without complication. Follow up CXR showed ***  She has a history of bradycardia at home with rates around 50.  She uses Toprol  Xl 50 prior to admission.  She does admit to fatigue and her dose was decreased to 12.5 mg XL. She was ambulating well on room air and tolerating a diet. Incisions were healing well without sign of infection.

## 2024-06-25 NOTE — Plan of Care (Signed)
  Problem: Education: Goal: Knowledge of General Education information will improve Description: Including pain rating scale, medication(s)/side effects and non-pharmacologic comfort measures Outcome: Progressing   Problem: Health Behavior/Discharge Planning: Goal: Ability to manage health-related needs will improve Outcome: Progressing   Problem: Clinical Measurements: Goal: Will remain free from infection Outcome: Progressing Goal: Diagnostic test results will improve Outcome: Progressing   Problem: Nutrition: Goal: Adequate nutrition will be maintained Outcome: Progressing   Problem: Elimination: Goal: Will not experience complications related to bowel motility Outcome: Progressing Goal: Will not experience complications related to urinary retention Outcome: Progressing   Problem: Safety: Goal: Ability to remain free from injury will improve Outcome: Progressing   Problem: Clinical Measurements: Goal: Respiratory complications will improve Outcome: Not Progressing   Problem: Activity: Goal: Risk for activity intolerance will decrease Outcome: Not Progressing   Problem: Pain Managment: Goal: General experience of comfort will improve and/or be controlled Outcome: Not Progressing

## 2024-06-26 ENCOUNTER — Encounter (HOSPITAL_COMMUNITY): Payer: Self-pay | Admitting: Thoracic Surgery (Cardiothoracic Vascular Surgery)

## 2024-06-26 ENCOUNTER — Encounter (HOSPITAL_COMMUNITY)

## 2024-06-26 ENCOUNTER — Inpatient Hospital Stay (HOSPITAL_COMMUNITY)

## 2024-06-26 DIAGNOSIS — J939 Pneumothorax, unspecified: Secondary | ICD-10-CM | POA: Diagnosis not present

## 2024-06-26 DIAGNOSIS — Z9889 Other specified postprocedural states: Secondary | ICD-10-CM

## 2024-06-26 DIAGNOSIS — T797XXA Traumatic subcutaneous emphysema, initial encounter: Secondary | ICD-10-CM | POA: Diagnosis not present

## 2024-06-26 DIAGNOSIS — J90 Pleural effusion, not elsewhere classified: Secondary | ICD-10-CM | POA: Diagnosis not present

## 2024-06-26 DIAGNOSIS — R918 Other nonspecific abnormal finding of lung field: Secondary | ICD-10-CM | POA: Diagnosis not present

## 2024-06-26 LAB — CBC
HCT: 39.4 % (ref 36.0–46.0)
Hemoglobin: 12.3 g/dL (ref 12.0–15.0)
MCH: 26.1 pg (ref 26.0–34.0)
MCHC: 31.2 g/dL (ref 30.0–36.0)
MCV: 83.7 fL (ref 80.0–100.0)
Platelets: 216 K/uL (ref 150–400)
RBC: 4.71 MIL/uL (ref 3.87–5.11)
RDW: 17.6 % — ABNORMAL HIGH (ref 11.5–15.5)
WBC: 12.3 K/uL — ABNORMAL HIGH (ref 4.0–10.5)
nRBC: 0 % (ref 0.0–0.2)

## 2024-06-26 LAB — BASIC METABOLIC PANEL WITH GFR
Anion gap: 11 (ref 5–15)
BUN: 13 mg/dL (ref 8–23)
CO2: 27 mmol/L (ref 22–32)
Calcium: 9.1 mg/dL (ref 8.9–10.3)
Chloride: 101 mmol/L (ref 98–111)
Creatinine, Ser: 0.89 mg/dL (ref 0.44–1.00)
GFR, Estimated: >60 mL/min (ref >60)
Glucose, Bld: 121 mg/dL — ABNORMAL HIGH (ref 70–99)
Potassium: 4.4 mmol/L (ref 3.5–5.1)
Sodium: 139 mmol/L (ref 135–145)

## 2024-06-26 MED ORDER — ENSURE PRE-SURGERY PO LIQD: 296.0000 mL | Freq: Once | ORAL | Status: DC

## 2024-06-26 NOTE — TOC CM/SW Note (Signed)
 Transition of Care [facility]) - Inpatient Brief Assessment   Patient Details  Name: JALEA BRONAUGH MRN: [id number] Date of Birth: 1946/02/23  [facility]) CM/SW Contact:    Lauraine FORBES Saa, LCSWA Phone Number: 06/26/2024, 9:31 AM   Clinical Narrative:  9:32 AM Per chart review, patient resides at home with spouse. Patient has a PCP and insurance. Patient does not have SNF or DME history. Patient has HH history with [facility]. Patient's preferred pharmacy is [facility]. No TOC needs identified at this time. TOC will continue to follow.   Transition of Care Asessment: Insurance and Status: Insurance coverage has been reviewed Patient has primary care physician: Yes Home environment has been reviewed: Private Residence Prior level of function:: N/A Prior/Current Home Services: No current home services Social Drivers of Health Review: SDOH reviewed no interventions necessary Readmission risk has been reviewed: Yes (Currently Green 7%) Transition of care needs: no transition of care needs at this time

## 2024-06-26 NOTE — Anesthesia Postprocedure Evaluation (Signed)
 Anesthesia Post Note  Patient: Christine Hart  Procedure(s) Performed: VIDEO ASSISTED THORACOSCOPY (Left) DECORTICATION (Left: Chest)     Patient location during evaluation: PACU Anesthesia Type: General Level of consciousness: awake and alert Pain management: pain level controlled Vital Signs Assessment: post-procedure vital signs reviewed and stable Respiratory status: spontaneous breathing, nonlabored ventilation, respiratory function stable and patient connected to nasal cannula oxygen Cardiovascular status: blood pressure returned to baseline and stable Postop Assessment: no apparent nausea or vomiting Anesthetic complications: no   No notable events documented.  Last Vitals:    Last Pain:                 Garnette FORBES [place]

## 2024-06-26 NOTE — Progress Notes (Addendum)
      [address] [place] [address]             [phone]    1 Day Post-Op Procedure(s) (LRB): VIDEO ASSISTED THORACOSCOPY (Left) DECORTICATION (Left)  Subjective:  Patient doing well.  She states her pain is well controlled.  She denies N/V.  Objective: Vital signs in last 24 hours: Temp:  [97.5 F (36.4 C)-98.3 F (36.8 C)] 97.8 F (36.6 C) (12/03 0727) Pulse Rate:  [40-58] 42 (12/03 0500) Cardiac Rhythm: Sinus bradycardia (12/03 0706) Resp:  [11-20] 20 (12/03 0727) BP: (103-192)/(47-78) 131/72 (12/03 0727) SpO2:  [94 %-100 %] 100 % (12/03 0727) Weight:  [70.8 kg] 70.8 kg (12/02 1056)  Intake/Output from previous day: 12/02 0701 - 12/03 0700 In: 1690 [P.O.:240; I.V.:1000; IV Piggyback:450] Out: 335 [Chest Tube:335]  General appearance: alert, cooperative, and no distress Heart: regular rate and rhythm Lungs: clear to auscultation bilaterally Abdomen: soft, non-tender; bowel sounds normal; no masses,  no organomegaly Extremities: extremities normal, atraumatic, no cyanosis or edema Wound: clean and dry  Lab Results: Recent Labs    06/24/24 0913 06/26/24 0605  WBC 7.6 12.3*  HGB 13.0 12.3  HCT 43.0 39.4  PLT 268 216   BMET:  Recent Labs    06/24/24 0913 06/26/24 0605  NA 138 139  K 4.0 4.4  CL 103 101  CO2 26 27  GLUCOSE 89 121*  BUN 13 13  CREATININE 0.94 0.89  CALCIUM  8.9 9.1    PT/INR:  Recent Labs    06/24/24 0913  LABPROT 14.0  INR 1.0   ABG    Component Value Date/Time   PHART 7.302 (L) 02/02/2024 0454   HCO3 22.2 02/02/2024 0454   TCO2 24 02/02/2024 0454   ACIDBASEDEF 4.0 (H) 02/02/2024 0454   O2SAT 96 02/02/2024 0454   CBG (last 3)  No results for input(s): GLUCAP in the last 72 hours.  Assessment/Plan: S/P Procedure(s) (LRB): VIDEO ASSISTED THORACOSCOPY (Left) DECORTICATION (Left)  CV- Sinus Bradycardia- per patient her baseline rate at home is around 49, she does take Toprol  XL daily.. BP is  well controlled.. Pulm- CT on water seal, no air leak present..335 cc output, CXR shows some sub cutaneous emphysema, ? Basilar pneumothorax, atelectasis.SABRA improvement in pleural effusion Renal- creatinine remains stable, okay to use Toradol prn ID- mild elevation in leukocytes.SABRA likely SIRS monitor Lovenox  for DVT prophylaxis   LOS: 1 day    Rocky Shad, PA-C 06/26/2024 7:32 AM   Agree CT to suction  Adonys Wildes O Gabrille Kilbride

## 2024-06-26 NOTE — Evaluation (Signed)
 Physical Therapy Evaluation Patient Details Name: Christine Hart MRN: [id number] DOB: Dec 18, 1945 Today's Date: 06/26/2024  History of Present Illness  78 yo female presents to [facility] hospital on 12/2 for L video assisted thoracoscopy due to chronic L pleural effusion. PMH CABGx3 CAD, mitral valve prolapse, SVT, HTN, HLD, arthrits, back pain and anxiety.  Clinical Impression  Pt presents to PT with deficits in endurance and cardiopulmonary function. Pt is able to ambulate for limited community distances at this time, reporting fatigue compared to baseline. Pt is encouraged to mobilize frequently with staff assistance in an effort to improve endurance. PT anticipates no post-acute PT needs at this time.        If plan is discharge home, recommend the following: Assistance with cooking/housework;Assist for transportation   Can travel by private vehicle        Equipment Recommendations None recommended by PT  Recommendations for Other Services       Functional Status Assessment Patient has had a recent decline in their functional status and demonstrates the ability to make significant improvements in function in a reasonable and predictable amount of time.     Precautions / Restrictions Precautions Precautions: Other (comment) (chest tube) Recall of Precautions/Restrictions: Intact Precaution/Restrictions Comments: L chest tube Restrictions Weight Bearing Restrictions Per Provider Order: No      Mobility  Bed Mobility Overal bed mobility: Modified Independent                  Transfers Overall transfer level: Needs assistance Equipment used: None Transfers: Sit to/from Stand Sit to Stand: Supervision                Ambulation/Gait Ambulation/Gait assistance: Supervision Gait Distance (Feet): 800 Feet Assistive device: None Gait Pattern/deviations: Step-through pattern Gait velocity: functional Gait velocity interpretation: >2.62 ft/sec, indicative of  community ambulatory   General Gait Details: steady step-through gait  Stairs            Wheelchair Mobility     Tilt Bed    Modified Rankin (Stroke Patients Only)       Balance Overall balance assessment: Needs assistance Sitting-balance support: No upper extremity supported, Feet supported Sitting balance-Leahy Scale: Good     Standing balance support: No upper extremity supported, During functional activity Standing balance-Leahy Scale: Good                               Pertinent Vitals/Pain Pain Assessment Pain Assessment: 0-10 Pain Score: 6  Pain Location: L ribs near chest tube site Pain Descriptors / Indicators: Sore Pain Intervention(s): Monitored during session    Home Living Family/patient expects to be discharged to:: Private residence Living Arrangements: Spouse/significant other Available Help at Discharge: Family;Available 24 hours/day Type of Home: House Home Access: Stairs to enter Entrance Stairs-Rails: Doctor, General Practice of Steps: 3   Home Layout: Two level;Able to live on main level with bedroom/bathroom Home Equipment: Cane - single point;Rollator (4 wheels);Toilet riser;Crutches;Shower seat      Prior Function Prior Level of Function : Independent/Modified Independent;Driving             Mobility Comments: enjoys playing with her great grandchildren, independent without DME       Extremity/Trunk Assessment   Upper Extremity Assessment Upper Extremity Assessment: Overall WFL for tasks assessed    Lower Extremity Assessment Lower Extremity Assessment: Overall WFL for tasks assessed    Cervical / Trunk Assessment Cervical / Trunk  Assessment: Normal  Communication   Communication Communication: No apparent difficulties    Cognition Arousal: Alert Behavior During Therapy: WFL for tasks assessed/performed   PT - Cognitive impairments: No apparent impairments                          Following commands: Intact       Cueing Cueing Techniques: Verbal cues     General Comments General comments (skin integrity, edema, etc.): VSS on RA    Exercises     Assessment/Plan    PT Assessment Patient needs continued PT services  PT Problem List Decreased activity tolerance;Cardiopulmonary status limiting activity       PT Treatment Interventions Gait training;Therapeutic exercise;Therapeutic activities;Patient/family education;Stair training;Balance training    PT Goals (Current goals can be found in the Care Plan section)  Acute Rehab PT Goals Patient Stated Goal: to improve endurance, play with her great grandkids PT Goal Formulation: With patient Time For Goal Achievement: 07/10/24 Potential to Achieve Goals: Good Additional Goals Additional Goal #1: Pt will ambulate for >1500' without reports of DOE to demonstrate improved tolerance for community mobility    Frequency Min 2X/week     Co-evaluation               AM-PAC PT 6 Clicks Mobility  Outcome Measure Help needed turning from your back to your side while in a flat bed without using bedrails?: None Help needed moving from lying on your back to sitting on the side of a flat bed without using bedrails?: None Help needed moving to and from a bed to a chair (including a wheelchair)?: A Little Help needed standing up from a chair using your arms (e.g., wheelchair or bedside chair)?: A Little Help needed to walk in hospital room?: A Little Help needed climbing 3-5 steps with a railing? : A Little 6 Click Score: 20    End of Session   Activity Tolerance: Patient tolerated treatment well Patient left: in bed;with call bell/phone within reach Nurse Communication: Mobility status PT Visit Diagnosis: Other abnormalities of gait and mobility (R26.89)    Time: 8457-8395 PT Time Calculation (min) (ACUTE ONLY): 22 min   Charges:   PT Evaluation $PT Eval Low Complexity: 1 Low   PT General  Charges $$ ACUTE PT VISIT: 1 Visit         Bernardino JINNY Ruth, PT, DPT Acute Rehabilitation Office [phone]   Bernardino JINNY Ruth 06/26/2024, 4:56 PM

## 2024-06-26 NOTE — Discharge Summary (Signed)
 [address] [place] [address]             [phone]        Physician Discharge Summary  Patient ID: Christine Hart MRN: [id number] DOB/AGE: 1946/01/16 78 y.o.  Admit date: 06/25/2024 Discharge date: 06/28/2024  Admission Diagnoses:  Patient Active Problem List   Diagnosis Date Noted   Pleural effusion, left 06/25/2024   CAD (coronary artery disease) 01/19/2024   Spondylolisthesis of lumbar region 12/07/2022   Sepsis (HCC) 10/29/2021   Anxiety 10/29/2021   Hyponatremia 10/29/2021   Influenza A 05/26/2021   Hypertension    Acute respiratory failure with hypoxia (HCC)    Arthropathy of cervical facet joint 02/16/2017    Discharge Diagnoses:   Patient Active Problem List   Diagnosis Date Noted   Pleural effusion, left 06/25/2024   CAD (coronary artery disease) 01/19/2024   Spondylolisthesis of lumbar region 12/07/2022   Sepsis (HCC) 10/29/2021   Anxiety 10/29/2021   Hyponatremia 10/29/2021   Influenza A 05/26/2021   Hypertension    Acute respiratory failure with hypoxia (HCC)    Arthropathy of cervical facet joint 02/16/2017   Discharged Condition: good  History of Present Illness:  Christine Hart is a 78 yo female with known history of Christine Hart is a 78 y.o. female with a past medical history of CAD, mitral valve prolapse, SVT, hypertension, hyperlipidemia, arthritis, chronic back pain, and anxiety.  She is also well known to [facility] as she underwent CABG x 3 performed by Dr. Shyrl on 02/01/2024.  Since her surgery she has continued to have issues with left pleural effusion.  She has undergo Thoracentesis on 3 separate occasions, with continued development of moderate left pleural effusion.  She continues to be symptomatic with exertion and with certain positions.  She was offered Video Assisted Thoracoscopy with drainage of effusion and pleurodesis and possible placement of Pleur-x catheter.  The risks and benefits of the  procedure were explained to the patient and she was agreeable to proceed.   Hospital Course:  Christine Hart presented to  [facility] on 06/25/2024.  She was taken to the operating room and underwent Left Video Assisted Thoracoscopy with drainage of left pleural effusion and decortication.  The patient tolerated the procedure without difficulty, was extubated, and taken to the PACU in stable condition.  The patient did well post operatively.  Her chest tube was managed on water seal without evidence of air leak.  Her chest xray showed improvement of pleural effusion.  There was some sub cutaneous emphysema present and possible sub pulmonic pneumothorax.  Her output slowed and her chest tube was able to be removed on POD3 without complication. Follow up CXR showed no pneumothorax.  She has a history of bradycardia at home with rates around 50.  She uses Toprol  Xl 50 prior to admission.  She does admit to fatigue and her dose was decreased to 12.5 mg XL and reported feeling better the next day. She was ambulating well on room air and tolerating a diet. Incisions were healing well without sign of infection.   Consults: None  Treatments: surgery:   06/25/2024 Patient:  Christine Hart Pre-Op Dx: Recurrent Left pleural effusion                           Hx of CABG Post-op Dx:  same Procedure: - Left Video  assisted thoracoscopy - Decortication - Intercostal nerve block   Surgeon and Role:      * Lightfoot, Linnie KIDD, MD - Primary     Discharge Exam: Blood pressure (!) 140/64, pulse 61, temperature 97.9 F (36.6 C), temperature source Oral, resp. rate 19, height 5' 6 (1.676 m), weight 70.8 kg, SpO2 100%. General appearance: alert, cooperative, and no distress Heart: regular rate and rhythm Lungs: diminished breath sounds at left base and otherwise clear. Normal work of breathing on RA.  Wound: dry dressing at chest tube site.   Disposition: Discharged to home in stable  condition   Allergies as of 06/28/2024       Reactions   Amlodipine  Swelling        Medication List     TAKE these medications    acetaminophen  500 MG tablet Commonly known as: TYLENOL  Take 1-2 tablets (500-1,000 mg total) by mouth every 6 (six) hours as needed.   ALPRAZolam  0.5 MG tablet Commonly known as: XANAX  Take 0.5 mg by mouth 3 (three) times daily as needed for anxiety.   aspirin  EC 81 MG tablet Take 1 tablet (81 mg total) by mouth daily. Swallow whole.   clopidogrel  75 MG tablet Commonly known as: PLAVIX  Take 1 tablet (75 mg total) by mouth daily.   escitalopram  10 MG tablet Commonly known as: LEXAPRO  Take 10 mg by mouth daily.   metoprolol  succinate 25 MG 24 hr tablet Commonly known as: TOPROL -XL Take 0.5 tablets (12.5 mg total) by mouth daily. Start taking on: June 29, 2024 What changed:  medication strength how much to take additional instructions   rosuvastatin  40 MG tablet Commonly known as: CRESTOR  Take 1 tablet (40 mg total) by mouth daily.   telmisartan  80 MG tablet Commonly known as: MICARDIS  Take 1 tablet (80 mg total) by mouth daily.   traMADol  50 MG tablet Commonly known as: ULTRAM  Take 1 tablet (50 mg total) by mouth every 6 (six) hours as needed (mild pain). What changed:  when to take this reasons to take this        Follow-up Information     Street, Lonni HERO, MD. Schedule an appointment as soon as possible for a visit.   Specialty: Family Medicine Contact information: [address] [place] [address] [phone]         Shyrl Linnie KIDD, MD. Call on 07/05/2024.   Specialty: Cardiothoracic Surgery Why: This is a virtual (telephone) visit. You do not need to go to the office. Dr. Shyrl will call you at around 3:10pm on 07/05/24. Contact information: [address] [place] [address] [phone]                 Signed: Laurel JUDITHANN Becket, PA-C  06/28/2024,  10:34 AM

## 2024-06-27 ENCOUNTER — Inpatient Hospital Stay (HOSPITAL_COMMUNITY)

## 2024-06-27 LAB — COMPREHENSIVE METABOLIC PANEL WITH GFR
ALT: 8 U/L (ref 0–44)
AST: 14 U/L — ABNORMAL LOW (ref 15–41)
Albumin: 2.6 g/dL — ABNORMAL LOW (ref 3.5–5.0)
Alkaline Phosphatase: 47 U/L (ref 38–126)
Anion gap: 7 (ref 5–15)
BUN: 17 mg/dL (ref 8–23)
CO2: 26 mmol/L (ref 22–32)
Calcium: 8.4 mg/dL — ABNORMAL LOW (ref 8.9–10.3)
Chloride: 106 mmol/L (ref 98–111)
Creatinine, Ser: 1 mg/dL (ref 0.44–1.00)
GFR, Estimated: 58 mL/min — ABNORMAL LOW (ref >60)
Glucose, Bld: 90 mg/dL (ref 70–99)
Potassium: 4 mmol/L (ref 3.5–5.1)
Sodium: 139 mmol/L (ref 135–145)
Total Bilirubin: 0.8 mg/dL (ref 0.0–1.2)
Total Protein: 5.2 g/dL — ABNORMAL LOW (ref 6.5–8.1)

## 2024-06-27 LAB — CBC
HCT: 35.7 % — ABNORMAL LOW (ref 36.0–46.0)
Hemoglobin: 11.1 g/dL — ABNORMAL LOW (ref 12.0–15.0)
MCH: 26 pg (ref 26.0–34.0)
MCHC: 31.1 g/dL (ref 30.0–36.0)
MCV: 83.6 fL (ref 80.0–100.0)
Platelets: 208 K/uL (ref 150–400)
RBC: 4.27 MIL/uL (ref 3.87–5.11)
RDW: 17.9 % — ABNORMAL HIGH (ref 11.5–15.5)
WBC: 10.9 K/uL — ABNORMAL HIGH (ref 4.0–10.5)
nRBC: 0 % (ref 0.0–0.2)

## 2024-06-27 MED ORDER — METOPROLOL SUCCINATE ER 25 MG PO TB24
12.5000 mg | ORAL_TABLET | Freq: Every day | ORAL | Status: DC
  Administered 2024-06-27 – 2024-06-28 (×2): 12.5 mg via ORAL
  Filled 2024-06-27 (×2): qty 1

## 2024-06-27 NOTE — Progress Notes (Addendum)
 Mobility Specialist: Progress Note   06/27/24 1600  Mobility  Activity Ambulated with assistance  Level of Assistance Standby assist, set-up cues, supervision of patient - no hands on  Assistive Device None  Distance Ambulated (ft) 600 ft  Activity Response Tolerated well  Mobility Referral Yes  Mobility Specialist Start Time (ACUTE ONLY) 1200  Mobility Specialist Stop Time (ACUTE ONLY) 1212  Mobility Specialist Time Calculation (min) (ACUTE ONLY) 12 min    Pt received in bed, pleasant and agreeable to mobility session. C/o soreness at chest tube site. SV throughout. VSS on RA. Returned to room. Left in bed with all needs met, call bell in reach. Husband present.   [name] Mobility Specialist Please contact via SecureChat or Rehab office at [phone]

## 2024-06-27 NOTE — Progress Notes (Addendum)
      [address] [place] [address]             [phone]         2 Days Post-Op Procedure(s) (LRB): VIDEO ASSISTED THORACOSCOPY (Left) DECORTICATION (Left)  Subjective:  Patient sitting up in bed without complaints.  She ambulated in the hallway yesterday without difficulty.  She is passing gas but has not yet moved her bowels.  Denies N/V  Objective: Vital signs in last 24 hours: Temp:  [97.5 F (36.4 C)-98.5 F (36.9 C)] 97.6 F (36.4 C) (12/04 0426) Pulse Rate:  [43-47] 43 (12/04 0426) Cardiac Rhythm: Sinus bradycardia (12/04 0702) Resp:  [11-19] 13 (12/04 0426) BP: (101-128)/(47-66) 115/53 (12/04 0426) SpO2:  [98 %-100 %] 99 % (12/04 0426)  Intake/Output from previous day: 12/03 0701 - 12/04 0700 In: 240 [P.O.:240] Out: 230 [Chest Tube:230]  General appearance: alert, cooperative, and no distress Heart: regular rate and rhythm Lungs: diminished breath sounds at left base Abdomen: soft, non-tender; bowel sounds normal; no masses,  no organomegaly Wound: clean, + serous drainage.. dressing personally changed  Lab Results: Recent Labs    06/26/24 0605 06/27/24 0311  WBC 12.3* 10.9*  HGB 12.3 11.1*  HCT 39.4 35.7*  PLT 216 208   BMET:  Recent Labs    06/26/24 0605 06/27/24 0311  NA 139 139  K 4.4 4.0  CL 101 106  CO2 27 26  GLUCOSE 121* 90  BUN 13 17  CREATININE 0.89 1.00  CALCIUM  9.1 8.4*    PT/INR:  Recent Labs    06/24/24 0913  LABPROT 14.0  INR 1.0   ABG    Component Value Date/Time   PHART 7.302 (L) 02/02/2024 0454   HCO3 22.2 02/02/2024 0454   TCO2 24 02/02/2024 0454   ACIDBASEDEF 4.0 (H) 02/02/2024 0454   O2SAT 96 02/02/2024 0454   CBG (last 3)  No results for input(s): GLUCAP in the last 72 hours.  Assessment/Plan: S/P Procedure(s) (LRB): VIDEO ASSISTED THORACOSCOPY (Left) DECORTICATION (Left)  CV- S/P CABG in July of this year, post operative A. Fib, but has been maintaining NSR.. she  takes Toprol  XL at 50 mg daily.SABRA HR is in the 40s here, BP is controlled.. wil decrease Toprol  XL dosing Pulm- CT on suction, no air leak, 230 cc output.. ? Tiny apical pneumothorax, trace left pleural effusion, sub cutaneous emphysema present Renal- creatinine at 1.00, monitor ID- leukocytosis improving Dispo- patient stable, continue chest tube tube   LOS: 2 days    Rocky Shad, PA-C 06/27/2024 7:28 AM  Agree Pulm hygiene  Kemiya Batdorf MALVA Rayas

## 2024-06-27 NOTE — Plan of Care (Signed)
  Problem: Health Behavior/Discharge Planning: Goal: Ability to manage health-related needs will improve Outcome: Progressing   Problem: Clinical Measurements: Goal: Respiratory complications will improve Outcome: Progressing   Problem: Activity: Goal: Risk for activity intolerance will decrease Outcome: Progressing   Problem: Pain Managment: Goal: General experience of comfort will improve and/or be controlled Outcome: Progressing   Problem: Safety: Goal: Ability to remain free from injury will improve Outcome: Progressing   Problem: Skin Integrity: Goal: Wound healing without signs and symptoms infection will improve Outcome: Progressing

## 2024-06-27 NOTE — Evaluation (Signed)
 Occupational Therapy Evaluation and Discharge Patient Details Name: Christine Hart MRN: [id number] DOB: [birth year] Today's Date: 06/27/2024   History of Present Illness   78 yo female presents to [facility] hospital on 12/2 for L video assisted thoracoscopy due to chronic L pleural effusion. PMH CABGx3 CAD, mitral valve prolapse, SVT, HTN, HLD, arthrits, back pain and anxiety.     Clinical Impressions Pt is typically independent. Currently functioning at a set up to supervision level to assist with chest tube and telemetry lines. Walked entire unit with stable VS on RA and no AD. Educated in energy conservation strategies and encouraged continued use of IS.      If plan is discharge home, recommend the following:         Functional Status Assessment   Patient has not had a recent decline in their functional status     Equipment Recommendations   None recommended by OT     Recommendations for Other Services         Precautions/Restrictions   Precautions Precautions: Other (comment) Recall of Precautions/Restrictions: Intact Precaution/Restrictions Comments: L chest tube Restrictions Weight Bearing Restrictions Per Provider Order: No     Mobility Bed Mobility Overal bed mobility: Modified Independent                  Transfers Overall transfer level: Needs assistance Equipment used: None Transfers: Sit to/from Stand Sit to Stand: Supervision                  Balance Overall balance assessment: Needs assistance   Sitting balance-Leahy Scale: Good       Standing balance-Leahy Scale: Good                             ADL either performed or assessed with clinical judgement   ADL                                         General ADL Comments: set up to supervision for lines     Vision Ability to See in Adequate Light: 0 Adequate Patient Visual Report: No change from baseline       Perception          Praxis         Pertinent Vitals/Pain Pain Assessment Pain Assessment: No/denies pain     Extremity/Trunk Assessment Upper Extremity Assessment Upper Extremity Assessment: Overall WFL for tasks assessed   Lower Extremity Assessment Lower Extremity Assessment: Overall WFL for tasks assessed   Cervical / Trunk Assessment Cervical / Trunk Assessment: Normal   Communication Communication Communication: Impaired Factors Affecting Communication: Hearing impaired   Cognition Arousal: Alert Behavior During Therapy: WFL for tasks assessed/performed Cognition: No apparent impairments                               Following commands: Intact       Cueing  General Comments   Cueing Techniques: Verbal cues      Exercises     Shoulder Instructions      Home Living Family/patient expects to be discharged to:: Private residence Living Arrangements: Spouse/significant other Available Help at Discharge: Family;Available 24 hours/day Type of Home: House Home Access: Stairs to enter [employer] of Steps: 3 Entrance Stairs-Rails: Right;Left Home Layout: Two level;Able  to live on main level with bedroom/bathroom     Bathroom Shower/Tub: Producer, Television/film/video: Handicapped height     Home Equipment: Cane - single point;Rollator (4 wheels);Toilet riser;Crutches;Shower seat          Prior Functioning/Environment Prior Level of Function : Independent/Modified Independent;Driving             Mobility Comments: enjoys playing with her great grandchildren, independent without DME      OT Problem List:     OT Treatment/Interventions:        OT Goals(Current goals can be found in the care plan section)       OT Frequency:       Co-evaluation              AM-PAC OT 6 Clicks Daily Activity     Outcome Measure Help from another person eating meals?: None Help from another person taking care of personal grooming?:  None Help from another person toileting, which includes using toliet, bedpan, or urinal?: None Help from another person bathing (including washing, rinsing, drying)?: None Help from another person to put on and taking off regular upper body clothing?: None Help from another person to put on and taking off regular lower body clothing?: None 6 Click Score: 24   End of Session Nurse Communication: Mobility status  Activity Tolerance: Patient tolerated treatment well Patient left: in bed;with call bell/phone within reach  OT Visit Diagnosis: Other (comment) (decreased activity tolerance)                Time: 9151-9092 OT Time Calculation (min): 19 min Charges:  OT General Charges $OT Visit: 1 Visit OT Evaluation $OT Eval Low Complexity: 1 Low Christine Hart, OTR/L Acute Rehabilitation Services Office: [phone]   Christine Hart 06/27/2024, 9:31 AM

## 2024-06-28 ENCOUNTER — Inpatient Hospital Stay (HOSPITAL_COMMUNITY)

## 2024-06-28 ENCOUNTER — Other Ambulatory Visit (HOSPITAL_COMMUNITY): Payer: Self-pay

## 2024-06-28 ENCOUNTER — Other Ambulatory Visit (HOSPITAL_BASED_OUTPATIENT_CLINIC_OR_DEPARTMENT_OTHER): Payer: Self-pay

## 2024-06-28 DIAGNOSIS — Z4682 Encounter for fitting and adjustment of non-vascular catheter: Secondary | ICD-10-CM | POA: Diagnosis not present

## 2024-06-28 DIAGNOSIS — J90 Pleural effusion, not elsewhere classified: Secondary | ICD-10-CM | POA: Diagnosis not present

## 2024-06-28 DIAGNOSIS — R918 Other nonspecific abnormal finding of lung field: Secondary | ICD-10-CM | POA: Diagnosis not present

## 2024-06-28 DIAGNOSIS — T797XXA Traumatic subcutaneous emphysema, initial encounter: Secondary | ICD-10-CM | POA: Diagnosis not present

## 2024-06-28 MED ORDER — ACETAMINOPHEN 500 MG PO TABS: 500.0000 mg | ORAL_TABLET | Freq: Four times a day (QID) | ORAL | Status: AC | PRN

## 2024-06-28 MED ORDER — METOPROLOL SUCCINATE ER 25 MG PO TB24
12.5000 mg | ORAL_TABLET | Freq: Every day | ORAL | 2 refills | Status: AC
  Filled 2024-06-28: qty 30, 60d supply, fill #0
  Filled 2024-08-13: qty 30, 60d supply, fill #1

## 2024-06-28 MED ORDER — TRAMADOL HCL 50 MG PO TABS
50.0000 mg | ORAL_TABLET | Freq: Four times a day (QID) | ORAL | 0 refills | Status: AC | PRN
  Filled 2024-06-28: qty 30, 8d supply, fill #0

## 2024-06-28 NOTE — Progress Notes (Signed)
      [address] [place] [address]             [phone]         3 Days Post-Op Procedure(s) (LRB): VIDEO ASSISTED THORACOSCOPY (Left) DECORTICATION (Left)  Subjective:  Patient sitting up in bed, said she is comfortable and not short of breath. She has walked in the hall. No new concerns.   Objective: Vital signs in last 24 hours: Temp:  [97.9 F (36.6 C)-99 F (37.2 C)] 97.9 F (36.6 C) (12/05 0737) Pulse Rate:  [47-61] 61 (12/05 0737) Cardiac Rhythm: Normal sinus rhythm (12/05 0725) Resp:  [15-19] 19 (12/05 0737) BP: (104-145)/(57-64) 140/64 (12/05 0737) SpO2:  [98 %-100 %] 100 % (12/05 0403)  Intake/Output from previous day: 12/04 0701 - 12/05 0700 In: 118 [P.O.:118] Out: 70 [Chest Tube:70]  General appearance: alert, cooperative, and no distress Heart: regular rate and rhythm Lungs: diminished breath sounds at left base Wound: dry dressing at chest tube site. CXR with less subQ air. I did not see an air leak at the PleurEvac.  Lab Results: Recent Labs    06/26/24 0605 06/27/24 0311  WBC 12.3* 10.9*  HGB 12.3 11.1*  HCT 39.4 35.7*  PLT 216 208   BMET:  Recent Labs    06/26/24 0605 06/27/24 0311  NA 139 139  K 4.4 4.0  CL 101 106  CO2 27 26  GLUCOSE 121* 90  BUN 13 17  CREATININE 0.89 1.00  CALCIUM  9.1 8.4*    PT/INR:  No results for input(s): LABPROT, INR in the last 72 hours.  ABG    Component Value Date/Time   PHART 7.302 (L) 02/02/2024 0454   HCO3 22.2 02/02/2024 0454   TCO2 24 02/02/2024 0454   ACIDBASEDEF 4.0 (H) 02/02/2024 0454   O2SAT 96 02/02/2024 0454   CBG (last 3)  No results for input(s): GLUCAP in the last 72 hours.  Assessment/Plan: S/P Procedure(s) (LRB): VIDEO ASSISTED THORACOSCOPY (Left) DECORTICATION (Left)   -POD3 VATS decortication for recurrent pleural effusion post CABG in July of this year.  CXR stable, I do not see a PTX, minimal drainage from the chest tube. SubQ air is  stable. Stable resp status on RA.  Possibly remove the tube today  -H/O HTN- BP controlled on low-dose metoprolol . She tends to be bradycardic and tolerates this OK.   -CAD, s/p CABG- on her ASA and rosuvastatin .   -GI- tolerating PO's , no BM yet. Lactulose today.   -Disposition- anticipate CT removal today and potential discharge later today or in AM.    LOS: 3 days    Cylis Ayars G. Tailynn Armetta, PA-C 06/28/2024 7:47 AM

## 2024-06-28 NOTE — Progress Notes (Signed)
 The chest tube was removed earlier this morning. Follow up CXR shows no significant pneumothorax and no change in the subQ air.  OK to discharge to home. Instructions given and follow up arranged.   EMERSON Becket, PA-C

## 2024-06-28 NOTE — TOC Transition Note (Signed)
 Transition of Care [facility]) - Discharge Note   Patient Details  Name: Christine Hart MRN: [id number] Date of Birth: 1946/04/01  Transition of Care [facility]) CM/SW Contact:  Roxie KANDICE Stain, RN Phone Number: 06/28/2024, 1:52 PM   Clinical Narrative:    Christine Hart is stable to discharge home. Follow up apt on AVS. No ICM (Inpatient Care Management) needs at this time.     Final next level of care: Home/Self Care Barriers to Discharge: Barriers Resolved   Patient Goals and CMS Choice Patient states their goals for this hospitalization and ongoing recovery are:: return home          Discharge Placement               home        Discharge Plan and Services Additional resources added to the After Visit Summary for                                       Social Drivers of Health (SDOH) Interventions SDOH Screenings   Food Insecurity: No Food Insecurity (06/25/2024)  Housing: Low Risk  (06/25/2024)  Transportation Needs: No Transportation Needs (06/25/2024)  Utilities: Not At Risk (06/25/2024)  Social Connections: Moderately Integrated (06/25/2024)  Tobacco Use: Low Risk  (06/25/2024)     Readmission Risk Interventions    02/09/2024   12:02 PM  Readmission Risk Prevention Plan  Post Dischage Appt Complete  Medication Screening Complete  Transportation Screening Complete

## 2024-06-28 NOTE — Progress Notes (Signed)
 Physical Therapy Treatment Patient Details Name: Christine Hart MRN: [id number] DOB: 1945-08-12 Today's Date: 06/28/2024   History of Present Illness 78 yo female presents to [facility] hospital on 12/2 for L video assisted thoracoscopy due to chronic L pleural effusion. PMH CABGx3 CAD, mitral valve prolapse, SVT, HTN, HLD, arthrits, back pain and anxiety.    PT Comments  Pt received in supine, pleasantly agreeable to therapy session and pt glad she is likely able to DC home today since her chest tube was recently removed. Pt performed bed mobility, transfers and 3 steps to simulate home entry modI with rail, and gait with Supervision and no AD, mostly due to need for wayfinding assist. Distance limited to longer household distance trial due to upcoming DC and phone call from pharmacy. Pt continues to benefit from PT services to progress toward functional mobility goals, pt likely to be able to DC acute PT next session as she is making excellent progress toward her goals and only very mild DOE 1/4 this date, SpO2 WFL on RA.    If plan is discharge home, recommend the following: Assistance with cooking/housework;Assist for transportation   Can travel by private vehicle        Equipment Recommendations  None recommended by PT    Recommendations for Other Services       Precautions / Restrictions Precautions Precautions: None Recall of Precautions/Restrictions: Intact Precaution/Restrictions Comments: L CT recently removed Restrictions Weight Bearing Restrictions Per Provider Order: No     Mobility  Bed Mobility Overal bed mobility: Modified Independent             General bed mobility comments: use of bed features, increased time    Transfers Overall transfer level: Modified independent Equipment used: None Transfers: Sit to/from Stand Sit to Stand: Modified independent (Device/Increase time)           General transfer comment: pushes up with hands     Ambulation/Gait Ambulation/Gait assistance: Supervision Gait Distance (Feet): 300 Feet Assistive device: None Gait Pattern/deviations: Step-through pattern, Decreased stride length, Drifts right/left       General Gait Details: slow cadence, pt needs cues for wayfinding in hallway/passed her room without realizing it. Cues for improved arm swing, DOE 1/4, SpO2 WFL on RA, HR 70's bpm.   Stairs Stairs: Yes Stairs assistance: Modified independent (Device/Increase time) Stair Management: One rail Left, Step to pattern, Forwards Number of Stairs: 3 General stair comments: single 7 platform in room x3 reps, no buckling or difficulty.   Wheelchair Mobility     Tilt Bed    Modified Rankin (Stroke Patients Only)       Balance Overall balance assessment: Needs assistance Sitting-balance support: No upper extremity supported, Feet supported Sitting balance-Leahy Scale: Normal     Standing balance support: No upper extremity supported, During functional activity Standing balance-Leahy Scale: Good                              Communication Communication Communication: Impaired Factors Affecting Communication: Hearing impaired  Cognition Arousal: Alert Behavior During Therapy: WFL for tasks assessed/performed   PT - Cognitive impairments: No apparent impairments                         Following commands: Intact      Cueing Cueing Techniques: Verbal cues  Exercises      General Comments General comments (skin integrity, edema, etc.): SpO2/HR [facility]  on RA BP 133/60 (83) just prior to ambulation, no acute s/sx distress SpO2 94% on RA with exertion.      Pertinent Vitals/Pain Pain Assessment Pain Assessment: 0-10 Pain Score: 4  Pain Location: L ribs near where CT removed today Pain Descriptors / Indicators: Sore Pain Intervention(s): Monitored during session    Home Living                          Prior Function             PT Goals (current goals can now be found in the care plan section) Acute Rehab PT Goals PT Goal Formulation: With patient Time For Goal Achievement: 07/10/24 Progress towards PT goals: Progressing toward goals    Frequency    Min 2X/week      PT Plan      Co-evaluation              AM-PAC PT 6 Clicks Mobility   Outcome Measure  Help needed turning from your back to your side while in a flat bed without using bedrails?: None Help needed moving from lying on your back to sitting on the side of a flat bed without using bedrails?: None Help needed moving to and from a bed to a chair (including a wheelchair)?: None Help needed standing up from a chair using your arms (e.g., wheelchair or bedside chair)?: None Help needed to walk in hospital room?: A Little Help needed climbing 3-5 steps with a railing? : None 6 Click Score: 23    End of Session Equipment Utilized During Treatment:  (pt has robe on, defers gait belt) Activity Tolerance: Patient tolerated treatment well Patient left: in bed;with call bell/phone within reach;with family/visitor present (sitting EOB to answer phone in room) Nurse Communication: Mobility status PT Visit Diagnosis: Other abnormalities of gait and mobility (R26.89)     Time: 8883-8871 PT Time Calculation (min) (ACUTE ONLY): 12 min  Charges:    $Gait Training: 8-22 mins PT General Charges $$ ACUTE PT VISIT: 1 Visit                     Christine Hart P., PTA Acute Rehabilitation Services Secure Chat Preferred 9a-5:30pm Office: [phone]    Christine Hart 06/28/2024, 12:31 PM

## 2024-06-28 NOTE — Care Management Important Message (Signed)
 Important Message  Patient Details  Name: Christine Hart MRN: [id number] Date of Birth: April 10, 1946   Important Message Given:  Yes - Medicare IM     Claretta Deed 06/28/2024, 2:09 PM

## 2024-07-01 ENCOUNTER — Encounter (HOSPITAL_COMMUNITY)

## 2024-07-01 ENCOUNTER — Telehealth: Payer: Self-pay

## 2024-07-01 NOTE — Transitions of Care (Post Inpatient/ED Visit) (Signed)
 07/01/2024  Name: Christine Hart MRN: [id number] DOB: 02/24/1946  Today's TOC FU Call Status: Today's TOC FU Call Status:: Successful TOC FU Call Completed TOC FU Call Complete Date: 07/01/24  Patient's Name and Date of Birth confirmed. DOB, Name  Transition Care Management Follow-up Telephone Call Date of Discharge: 06/28/24 Discharge Facility: [facility]) Type of Discharge: Inpatient Admission Primary Inpatient Discharge Diagnosis:: Pleural effusion, left How have you been since you were released from the hospital?: Better Any questions or concerns?: No  Items Reviewed: Did you receive and understand the discharge instructions provided?: Yes Medications obtained,verified, and reconciled?: Yes (Medications Reviewed) Any new allergies since your discharge?: No Dietary orders reviewed?: Yes Type of Diet Ordered:: Low sodium heart healthy Do you have support at home?: Yes People in Home [RPT]: spouse Name of Support/Comfort Primary Source: husband at the home, daughter 2 miles away and daughter 7 miles away  - all could assist  Medications Reviewed Today: Medications Reviewed Today     Reviewed by Lauro Shona LABOR, RN (Registered Nurse) on 07/01/24 at 1223  Med List Status: <None>   Medication Order Taking? Sig Documenting Provider Last Dose Status Informant  acetaminophen  (TYLENOL ) 500 MG tablet [id number]  Take 1-2 tablets (500-1,000 mg total) by mouth every 6 (six) hours as needed.  Patient not taking: Reported on 07/01/2024   Roddenberry, Myron G, PA-C  Active   ALPRAZolam  (XANAX ) 0.5 MG tablet [id number] Yes Take 0.5 mg by mouth 3 (three) times daily as needed for anxiety. [provider]  Active Self  aspirin  EC 81 MG tablet [id number] Yes Take 1 tablet (81 mg total) by mouth daily. Swallow whole. Goodrich, Callie E, PA-C  Active Self  clopidogrel  (PLAVIX ) 75 MG tablet [id number] Yes Take 1 tablet (75 mg total) by mouth daily. Roddenberry, Myron G, PA-C  Active  Self  escitalopram  (LEXAPRO ) 10 MG tablet [id number] Yes Take 10 mg by mouth daily. [provider]  Active Self  metoprolol  succinate (TOPROL -XL) 25 MG 24 hr tablet [id number] Yes Take 0.5 tablets (12.5 mg total) by mouth daily. Roddenberry, Myron G, PA-C  Active   rosuvastatin  (CRESTOR ) 40 MG tablet [id number] Yes Take 1 tablet (40 mg total) by mouth daily. Goodrich, Callie E, PA-C  Active Self  telmisartan  (MICARDIS ) 80 MG tablet [id number] Yes Take 1 tablet (80 mg total) by mouth daily. Wonda Sharper, MD  Active Self  traMADol  (ULTRAM ) 50 MG tablet [id number] Yes Take 1 tablet (50 mg total) by mouth every 6 (six) hours as needed (mild pain). Roddenberry, Myron G, PA-C  Active             Home Care and Equipment/Supplies: Were Home Health Services Ordered?: No Any new equipment or medical supplies ordered?: No  Functional Questionnaire: Do you need assistance with bathing/showering or dressing?: No Do you need assistance with meal preparation?: No Do you need assistance with eating?: No Do you have difficulty maintaining continence: No Do you need assistance with getting out of bed/getting out of a chair/moving?: No Do you have difficulty managing or taking your medications?: No  Follow up appointments reviewed: PCP Follow-up appointment confirmed?: No (patient wants to call and schedule) Specialist Hospital Follow-up appointment confirmed?: Yes Date of Specialist follow-up appointment?: 07/05/24 Follow-Up Specialty Provider:: Linnie KIDD [place] Do you need transportation to your follow-up appointment?: No Do you understand care options if your condition(s) worsen?: Yes-patient verbalized understanding  SDOH Interventions Today    Flowsheet Row Most Recent Value  SDOH Interventions  Food Insecurity Interventions Intervention Not Indicated  Housing Interventions Intervention Not Indicated  Transportation Interventions Intervention Not Indicated  Utilities  Interventions Intervention Not Indicated    Goals Addressed             This Visit's Progress    VBCI Transitions of Care (TOC) Care Plan       Problems:  Recent Hospitalization for treatment of Pleural effusion, left No Hospital Follow Up Provider appointment Patient wants to call her PCP office to schedule hospital f/u   Goal:  Over the next 30 days, the patient will not experience hospital readmission  Interventions:  Transitions of Care: Doctor Visits  - discussed the importance of doctor visits Post-op wound/incision care reviewed with patient/caregiver Reviewed Signs and symptoms of infection  Surgery (VATS - video assisted thoracoscopic surgery): Evaluation of current treatment plan related to left pleural effusion surgery reviewed post-operative instructions with patient/caregiver addressed questions about post - surgical incision care  reviewed medications with patient and addressed questions  Patient Self Care Activities:  Attend all scheduled provider appointments Call pharmacy for medication refills 3-7 days in advance of running out of medications Call provider office for new concerns or questions  Notify RN Care Manager of TOC call rescheduling needs Participate in Transition of Care Program/Attend TOC scheduled calls Take medications as prescribed   Monitor for s/s infection and notify provider  Plan:  Telephone follow up appointment with care management team member scheduled for:  07/09/24 in the afternoon  The patient has been provided with contact information for the care management team and has been advised to call with any health related questions or concerns.         Shona Prow RN, CCM [place]  VBCI-Population Health RN Care Manager [phone]

## 2024-07-01 NOTE — Patient Instructions (Signed)
 Visit Information  Thank you for taking time to visit with me today. Please don't hesitate to contact me if I can be of assistance to you before our next scheduled telephone appointment.  Our next appointment is by telephone on 07/09/24 in the afternoon   Following is a copy of your care plan:   Goals Addressed             This Visit's Progress    VBCI Transitions of Care (TOC) Care Plan       Problems:  Recent Hospitalization for treatment of Pleural effusion, left No Hospital Follow Up Provider appointment Patient wants to call her PCP office to schedule hospital f/u   Goal:  Over the next 30 days, the patient will not experience hospital readmission  Interventions:  Transitions of Care: Doctor Visits  - discussed the importance of doctor visits Post-op wound/incision care reviewed with patient/caregiver Reviewed Signs and symptoms of infection  Surgery (VATS - video assisted thoracoscopic surgery): Evaluation of current treatment plan related to left pleural effusion surgery reviewed post-operative instructions with patient/caregiver addressed questions about post - surgical incision care  reviewed medications with patient and addressed questions  Patient Self Care Activities:  Attend all scheduled provider appointments Call pharmacy for medication refills 3-7 days in advance of running out of medications Call provider office for new concerns or questions  Notify RN Care Manager of [facility] call rescheduling needs Participate in Transition of Care Program/Attend TOC scheduled calls Take medications as prescribed   Monitor for s/s infection and notify provider  Plan:  Telephone follow up appointment with care management team member scheduled for:  07/09/24 in the afternoon  The patient has been provided with contact information for the care management team and has been advised to call with any health related questions or concerns.         Patient verbalizes  understanding of instructions and care plan provided today and agrees to view in MyChart. Active MyChart status and patient understanding of how to access instructions and care plan via MyChart confirmed with patient.     Telephone follow up appointment with care management team member scheduled for: 07/09/24 The patient has been provided with contact information for the care management team and has been advised to call with any health related questions or concerns.   Please call the care guide team at [phone] if you need to cancel or reschedule your appointment.   Please call the Suicide and Crisis Lifeline: 988 call 1-800-273-TALK (toll free, 24 hour hotline) call 911 if you are experiencing a Mental Health or Behavioral Health Crisis or need someone to talk to.  Shona Prow RN, CCM [place]  VBCI-Population Health RN Care Manager [phone]

## 2024-07-03 ENCOUNTER — Encounter (HOSPITAL_COMMUNITY)

## 2024-07-05 ENCOUNTER — Ambulatory Visit
Attending: Thoracic Surgery (Cardiothoracic Vascular Surgery) | Admitting: Thoracic Surgery (Cardiothoracic Vascular Surgery)

## 2024-07-05 DIAGNOSIS — J90 Pleural effusion, not elsewhere classified: Secondary | ICD-10-CM

## 2024-07-05 NOTE — Progress Notes (Signed)
°   °  [address] [place] [address]             [phone]     Patient: Home Provider: Office Consent for Telemedicine visit obtained.  Todays visit was completed via a real-time telehealth (see specific modality noted below). The patient/authorized person provided oral consent at the time of the visit to engage in a telemedicine encounter with the present provider at [facility]. The patient/authorized person was informed of the potential benefits, limitations, and risks of telemedicine. The patient/authorized person expressed understanding that the laws that protect confidentiality also apply to telemedicine. The patient/authorized person acknowledged understanding that telemedicine does not provide emergency services and that he or she would need to call 911 or proceed to the nearest hospital for help if such a need arose.   Total time spent in the clinical discussion 10 minutes.  Telehealth Modality: Phone visit (audio only)  I had a telephone visit with Christine Hart She is doing much better from a breathing standpoint.  She is ambulating well.  Her pain is under good control.  She will follow-up in 1 month with a CXR.

## 2024-07-08 ENCOUNTER — Encounter (HOSPITAL_COMMUNITY)

## 2024-07-10 ENCOUNTER — Encounter (HOSPITAL_COMMUNITY)

## 2024-07-10 ENCOUNTER — Telehealth: Payer: Self-pay

## 2024-07-10 NOTE — Patient Instructions (Signed)
 Visit Information  Thank you for taking time to visit with me today. Please don't hesitate to contact me if I can be of assistance to you before our next scheduled telephone appointment.  Our next appointment is by telephone on 07/24/2024 in the afternoon  Following is a copy of your care plan:   Goals Addressed             This Visit's Progress    VBCI Transitions of Care (TOC) Care Plan       Problems:  Recent Hospitalization for treatment of Pleural effusion, left No Hospital Follow Up Provider appointment Patient wants to call her PCP office to schedule hospital f/u   Goal:  Over the next 30 days, the patient will not experience hospital readmission  Interventions:  Transitions of Care: Doctor Visits  - discussed the importance of doctor visits Post-op wound/incision care reviewed with patient/caregiver Reviewed Signs and symptoms of infection  Update 07/10/24: Patient states she is doing well - denied any incisional issues, is getting out - took dog to groomer, states shortness of breath is improving, spoke with Dr Shyrl as planned and understands she is to have chest xray 1 month and has scheduled echocardiogram tomorrow 07/11/24 - Patient states she does not feel she needs to schedule with PCP since she talked with Dr Shyrl. Patient requests to skip next week's call due to holiday - Will follow up 07/24/24 and if patient is still doing well with no issues, will plan to discuss CCM referral /TOC program closure  Surgery (VATS - video assisted thoracoscopic surgery): Evaluation of current treatment plan related to left pleural effusion surgery reviewed post-operative instructions with patient/caregiver addressed questions about post - surgical incision care  reviewed medications with patient and addressed questions  Patient Self Care Activities:  Attend all scheduled provider appointments Call pharmacy for medication refills 3-7 days in advance of running out of  medications Call provider office for new concerns or questions  Notify RN Care Manager of [facility] call rescheduling needs Participate in Transition of Care Program/Attend TOC scheduled calls Take medications as prescribed   Monitor for s/s infection and notify provider  Plan:  Telephone follow up appointment with care management team member scheduled for:  07/24/24 in the afternoon  The patient has been provided with contact information for the care management team and has been advised to call with any health related questions or concerns.         Patient verbalizes understanding of instructions and care plan provided today and agrees to view in MyChart. Active MyChart status and patient understanding of how to access instructions and care plan via MyChart confirmed with patient.     Telephone follow up appointment with care management team member scheduled for: 07/24/24 The patient has been provided with contact information for the care management team and has been advised to call with any health related questions or concerns.   Please call the care guide team at [phone] if you need to cancel or reschedule your appointment.   Please call the Suicide and Crisis Lifeline: 988 call 1-800-273-TALK (toll free, 24 hour hotline) call 911 if you are experiencing a Mental Health or Behavioral Health Crisis or need someone to talk to.  Shona Prow RN, CCM [place]  VBCI-Population Health RN Care Manager [phone]

## 2024-07-10 NOTE — Transitions of Care (Post Inpatient/ED Visit) (Signed)
 Transition of Care week 2  Visit Note  07/10/2024  Name: Christine Hart MRN: [id number]          DOB: January 29, 1946  Situation: Patient enrolled in [facility] 30-day program. Visit completed with patient by telephone.   Background: Admit/Discharge Date:   12/2 - 12/5  [facility] Primary Diagnosis: Pleural effusion, left  Initial Transition Care Management Follow-up Telephone Call Discharge Date and Diagnosis: 06/28/24, Pleural effusion, left   Past Medical History:  Diagnosis Date   Anxiety    Arthritis    Cancer (HCC)    basal and squamous cell carcinoma. Lesion excisions in 2023.   Complication of anesthesia    Limited mouth opening and neck mobility, s/p TMJ surgeries    Coronary artery disease    Difficult intubation    Fibromyalgia    Hypertension    MVP (mitral valve prolapse)    Pneumonia 2023   Hospitalized for 2 nights, [place].   Polio    as a child   PONV (postoperative nausea and vomiting)    Tachycardia    15 years ago my family doctor put me on metoprolol  - 06/24/24    Assessment: Patient Reported Symptoms: Cognitive Cognitive Status: No symptoms reported, Normal speech and language skills, Alert and oriented to person, place, and time      Neurological Neurological Review of Symptoms: No symptoms reported    HEENT HEENT Symptoms Reported: No symptoms reported      Cardiovascular Cardiovascular Symptoms Reported: No symptoms reported    Respiratory Respiratory Symptoms Reported: Shortness of breath Other Respiratory Symptoms: patient reports only with exertion    Endocrine Endocrine Symptoms Reported: No symptoms reported    Gastrointestinal Gastrointestinal Symptoms Reported: No symptoms reported Additional Gastrointestinal Details: patient reports she moved her bowel yesterday 07/09/24 without difficulty      Genitourinary Genitourinary Symptoms Reported: No symptoms reported    Integumentary Integumentary Symptoms Reported:  Incision Additional Integumentary Details: patient reports no issues - and everything looks good everything is OTA Skin Self-Management Outcome: 4 (good)  Musculoskeletal Musculoskelatal Symptoms Reviewed: No symptoms reported        Psychosocial           There were no vitals filed for this visit. Pain Scale: 0-10 Pain Score: 0-No pain  Medications Reviewed Today     Reviewed by Lauro Shona LABOR, RN (Registered Nurse) on 07/10/24 at 1551  Med List Status: <None>   Medication Order Taking? Sig Documenting Provider Last Dose Status Informant  acetaminophen  (TYLENOL ) 500 MG tablet [id number]  Take 1-2 tablets (500-1,000 mg total) by mouth every 6 (six) hours as needed.  Patient not taking: Reported on 07/10/2024   Roddenberry, Myron G, PA-C  Active   ALPRAZolam  (XANAX ) 0.5 MG tablet [id number] Yes Take 0.5 mg by mouth 3 (three) times daily as needed for anxiety. [provider]  Active Self  aspirin  EC 81 MG tablet [id number] Yes Take 1 tablet (81 mg total) by mouth daily. Swallow whole. Goodrich, Callie E, PA-C  Active Self  clopidogrel  (PLAVIX ) 75 MG tablet [id number] Yes Take 1 tablet (75 mg total) by mouth daily. Roddenberry, Myron G, PA-C  Active Self  escitalopram  (LEXAPRO ) 10 MG tablet [id number] Yes Take 10 mg by mouth daily. [provider]  Active Self  metoprolol  succinate (TOPROL -XL) 25 MG 24 hr tablet [id number] Yes Take 0.5 tablets (12.5 mg total) by mouth daily. Roddenberry, Myron G, PA-C  Active   rosuvastatin  (CRESTOR ) 40 MG tablet [id number] Yes  Take 1 tablet (40 mg total) by mouth daily. Goodrich, Callie E, PA-C  Active Self  telmisartan  (MICARDIS ) 80 MG tablet [id number] Yes Take 1 tablet (80 mg total) by mouth daily. Wonda Sharper, MD  Active Self  traMADol  (ULTRAM ) 50 MG tablet [id number] Yes Take 1 tablet (50 mg total) by mouth every 6 (six) hours as needed (mild pain). Roddenberry, Myron G, PA-C  Active             Recommendation:   Continue  Current Plan of Care  Follow Up Plan:   Telephone follow up appointment date/time:  07/24/24 in the afternoon   Shona Prow RN, CCM [place]  VBCI-Population Health RN Care Manager [phone]

## 2024-07-11 ENCOUNTER — Ambulatory Visit (HOSPITAL_COMMUNITY)
Admission: RE | Admit: 2024-07-11 | Discharge: 2024-07-11 | Attending: Cardiovascular Disease | Admitting: Cardiovascular Disease

## 2024-07-11 DIAGNOSIS — R0609 Other forms of dyspnea: Secondary | ICD-10-CM

## 2024-07-11 DIAGNOSIS — I5022 Chronic systolic (congestive) heart failure: Secondary | ICD-10-CM | POA: Diagnosis present

## 2024-07-11 DIAGNOSIS — R600 Localized edema: Secondary | ICD-10-CM | POA: Diagnosis not present

## 2024-07-11 LAB — ECHOCARDIOGRAM COMPLETE
AR max vel: 2.35 cm2
AV Area VTI: 2.41 cm2
AV Area mean vel: 2.37 cm2
AV Mean grad: 6 mmHg
AV Peak grad: 10.9 mmHg
Ao pk vel: 1.65 m/s
Area-P 1/2: 3.81 cm2
S' Lateral: 2.97 cm

## 2024-07-11 MED ORDER — PERFLUTREN LIPID MICROSPHERE
1.0000 mL | INTRAVENOUS | Status: AC | PRN
  Administered 2024-07-11: 12:00:00 5 mL via INTRAVENOUS

## 2024-07-15 ENCOUNTER — Encounter (HOSPITAL_COMMUNITY)

## 2024-07-15 ENCOUNTER — Other Ambulatory Visit (HOSPITAL_BASED_OUTPATIENT_CLINIC_OR_DEPARTMENT_OTHER): Payer: Self-pay

## 2024-07-15 MED ORDER — FLUZONE HIGH-DOSE 0.5 ML IM SUSY
0.5000 mL | PREFILLED_SYRINGE | Freq: Once | INTRAMUSCULAR | 0 refills | Status: AC
  Filled 2024-07-15: qty 0.5, 1d supply, fill #0

## 2024-07-17 ENCOUNTER — Encounter (HOSPITAL_COMMUNITY)

## 2024-07-19 ENCOUNTER — Ambulatory Visit: Payer: Self-pay | Admitting: Cardiovascular Disease

## 2024-07-19 DIAGNOSIS — I3139 Other pericardial effusion (noninflammatory): Secondary | ICD-10-CM

## 2024-07-22 ENCOUNTER — Encounter (HOSPITAL_COMMUNITY)

## 2024-07-22 ENCOUNTER — Other Ambulatory Visit (HOSPITAL_BASED_OUTPATIENT_CLINIC_OR_DEPARTMENT_OTHER): Payer: Self-pay

## 2024-07-22 MED ORDER — COLCHICINE 0.6 MG PO TABS
0.6000 mg | ORAL_TABLET | Freq: Every day | ORAL | 3 refills | Status: AC
  Filled 2024-07-22: qty 30, 30d supply, fill #0
  Filled 2024-08-13 – 2024-08-14 (×2): qty 30, 30d supply, fill #1

## 2024-07-24 ENCOUNTER — Telehealth: Payer: Self-pay

## 2024-07-24 ENCOUNTER — Encounter (HOSPITAL_COMMUNITY)

## 2024-07-24 NOTE — Transitions of Care (Post Inpatient/ED Visit) (Signed)
 " Transition of Care week 4  Visit Note  07/24/2024  Name: Christine Hart MRN: [id number]          DOB: August 21, 1945  Situation: Patient enrolled in [facility] 30-day program. Visit completed with patient by telephone.   Background: Admit/Discharge Date:   12/2 - 12/5  [facility] Primary Diagnosis: Pleural effusion, left  Initial Transition Care Management Follow-up Telephone Call Discharge Date and Diagnosis: 06/28/24, Pleural effusion, left   Past Medical History:  Diagnosis Date   Anxiety    Arthritis    Cancer (HCC)    basal and squamous cell carcinoma. Lesion excisions in 2023.   Complication of anesthesia    Limited mouth opening and neck mobility, s/p TMJ surgeries    Coronary artery disease    Difficult intubation    Fibromyalgia    Hypertension    MVP (mitral valve prolapse)    Pneumonia 2023   Hospitalized for 2 nights, [place].   Polio    as a child   PONV (postoperative nausea and vomiting)    Tachycardia    15 years ago my family doctor put me on metoprolol  - 06/24/24    Assessment: Patient Reported Symptoms: Cognitive Cognitive Status: No symptoms reported, Normal speech and language skills, Alert and oriented to person, place, and time      Neurological Neurological Review of Symptoms: No symptoms reported    HEENT HEENT Symptoms Reported: Nasal discharge (patient states she has runny nose and reports this comes and goes since her hospitalization - states she went to [facility] and was given an atx and it helped but returned and was given a new atx that helped)      Cardiovascular Cardiovascular Symptoms Reported: Swelling in legs or feet (patient states, 'I have a little bit of swelling in my left ankle that is improved when I wake and increases as the day goes on) Weight: 152 lb (68.9 kg) (patient reports she weighs daily and was 152lbs today on home scale)  Respiratory Respiratory Symptoms Reported: Shortness of breath Other Respiratory Symptoms: patient  reports shortness of breath is getting better - walked 2 laps around the mall and staets she has noticed shortness of breath more often when bending and walking long distances    Endocrine Endocrine Symptoms Reported: No symptoms reported    Gastrointestinal Gastrointestinal Symptoms Reported: Other Other Gastrointestinal Symptoms: patient reports constipation comes and goes and lastBM was Sunday - states this is normal for her and she wouldn't be concerned unless she wasn't moving her bowels for 5 bowels - states she has dealt with this for about 40 years      Genitourinary Genitourinary Symptoms Reported: No symptoms reported    Integumentary Integumentary Symptoms Reported: No symptoms reported    Musculoskeletal Musculoskelatal Symptoms Reviewed: No symptoms reported        Psychosocial           Today's Vitals   07/24/24 1327  Weight: 152 lb (68.9 kg)   Pain Scale: 0-10 Pain Score: 0-No pain  Medications Reviewed Today     Reviewed by Lauro Shona LABOR, RN (Registered Nurse) on 07/24/24 at 1321  Med List Status: <None>   Medication Order Taking? Sig Documenting Provider Last Dose Status Informant  acetaminophen  (TYLENOL ) 500 MG tablet [id number]  Take 1-2 tablets (500-1,000 mg total) by mouth every 6 (six) hours as needed.  Patient not taking: Reported on 07/24/2024   Roddenberry, Myron G, PA-C  Active   ALPRAZolam  (XANAX ) 0.5  MG tablet [id number] Yes Take 0.5 mg by mouth 3 (three) times daily as needed for anxiety. [provider]  Active Self  aspirin  EC 81 MG tablet [id number] Yes Take 1 tablet (81 mg total) by mouth daily. Swallow whole. Goodrich, Callie E, PA-C  Active Self  clopidogrel  (PLAVIX ) 75 MG tablet [id number] Yes Take 1 tablet (75 mg total) by mouth daily. Roddenberry, Myron G, PA-C  Active Self  colchicine  0.6 MG tablet [id number] Yes Take 1 tablet (0.6 mg total) by mouth daily. Wonda Sharper, MD  Active   escitalopram  (LEXAPRO ) 10 MG tablet  [id number] Yes Take 10 mg by mouth daily. [provider]  Active Self  metoprolol  succinate (TOPROL -XL) 25 MG 24 hr tablet [id number] Yes Take 0.5 tablets (12.5 mg total) by mouth daily. Roddenberry, Myron G, PA-C  Active   rosuvastatin  (CRESTOR ) 40 MG tablet [id number] Yes Take 1 tablet (40 mg total) by mouth daily. Goodrich, Callie E, PA-C  Active Self  telmisartan  (MICARDIS ) 80 MG tablet [id number] Yes Take 1 tablet (80 mg total) by mouth daily. Wonda Sharper, MD  Active Self  traMADol  (ULTRAM ) 50 MG tablet [id number] Yes Take 1 tablet (50 mg total) by mouth every 6 (six) hours as needed (mild pain). Roddenberry, Myron G, PA-C  Active             Recommendation:   Patient will continue to follow up with providers  Follow Up Plan:   Closing From:  Transitions of Care Program  Shona Prow RN, CCM [facility]  VBCI-Population Health RN Care Manager [phone]     "

## 2024-07-29 ENCOUNTER — Encounter (HOSPITAL_COMMUNITY)

## 2024-07-31 ENCOUNTER — Encounter (HOSPITAL_COMMUNITY)

## 2024-08-05 ENCOUNTER — Encounter (HOSPITAL_COMMUNITY)

## 2024-08-07 ENCOUNTER — Encounter (HOSPITAL_COMMUNITY)

## 2024-08-12 ENCOUNTER — Encounter (HOSPITAL_COMMUNITY)

## 2024-08-12 ENCOUNTER — Other Ambulatory Visit (HOSPITAL_BASED_OUTPATIENT_CLINIC_OR_DEPARTMENT_OTHER): Payer: Self-pay

## 2024-08-13 ENCOUNTER — Other Ambulatory Visit (HOSPITAL_BASED_OUTPATIENT_CLINIC_OR_DEPARTMENT_OTHER): Payer: Self-pay

## 2024-08-14 ENCOUNTER — Other Ambulatory Visit: Payer: Self-pay | Admitting: Thoracic Surgery (Cardiothoracic Vascular Surgery)

## 2024-08-14 ENCOUNTER — Encounter (HOSPITAL_COMMUNITY)

## 2024-08-14 ENCOUNTER — Other Ambulatory Visit (HOSPITAL_BASED_OUTPATIENT_CLINIC_OR_DEPARTMENT_OTHER): Payer: Self-pay

## 2024-08-14 DIAGNOSIS — J90 Pleural effusion, not elsewhere classified: Secondary | ICD-10-CM

## 2024-08-16 ENCOUNTER — Encounter: Payer: Self-pay | Admitting: Thoracic Surgery (Cardiothoracic Vascular Surgery)

## 2024-08-16 ENCOUNTER — Ambulatory Visit (HOSPITAL_COMMUNITY)
Admission: RE | Admit: 2024-08-16 | Discharge: 2024-08-16 | Disposition: A | Source: Ambulatory Visit | Attending: Cardiovascular Disease

## 2024-08-16 ENCOUNTER — Ambulatory Visit
Payer: Self-pay | Attending: Thoracic Surgery (Cardiothoracic Vascular Surgery) | Admitting: Thoracic Surgery (Cardiothoracic Vascular Surgery)

## 2024-08-16 VITALS — BP 178/82 | HR 58 | Resp 20 | Ht 66.0 in | Wt 155.1 lb

## 2024-08-16 DIAGNOSIS — Z951 Presence of aortocoronary bypass graft: Secondary | ICD-10-CM

## 2024-08-16 DIAGNOSIS — J90 Pleural effusion, not elsewhere classified: Secondary | ICD-10-CM

## 2024-08-16 NOTE — Progress Notes (Signed)
" ° °   °  [address] [place] [address]             [phone]       Christine Hart [facility] Medical Record #[id number] Date of Birth: May 24, 1946  Referring: Street, Lonni HERO, MD Primary Care: Street, Lonni HERO, MD Primary Cardiologist:Michael Wonda, MD  Reason for visit:   follow-up  History of Present Illness:     Christine Hart present for 1 month follow-up after VATS decort.  Overall, she is doing much better from a respiratory standpoint  Physical Exam: Ht 5' 6 (1.676 m)   BMI 24.53 kg/m   Alert NAD Abdomen, ND no peripheral edema   Diagnostic Studies & Laboratory data: CXR:  clear     Assessment / Plan:   79 y.o. female s/p VATS decort for recurrent pleural effusion following CABG.  She is doing much better.  She will follow-up as needed   Christine Hart 08/16/2024 10:48 AM       "

## 2024-09-05 ENCOUNTER — Ambulatory Visit: Admitting: Physician Assistant

## 2024-10-17 ENCOUNTER — Ambulatory Visit (HOSPITAL_COMMUNITY)
